# Patient Record
Sex: Female | Born: 1935
Health system: Southern US, Community
[De-identification: ages and names within clinical notes are randomized; demographics above are authoritative.]

## PROBLEM LIST (undated history)

## (undated) DIAGNOSIS — R112 Nausea with vomiting, unspecified: Secondary | ICD-10-CM

## (undated) DIAGNOSIS — F32A Depression, unspecified: Secondary | ICD-10-CM

## (undated) DIAGNOSIS — Z9889 Other specified postprocedural states: Secondary | ICD-10-CM

## (undated) DIAGNOSIS — IMO0001 Reserved for inherently not codable concepts without codable children: Secondary | ICD-10-CM

## (undated) DIAGNOSIS — F329 Major depressive disorder, single episode, unspecified: Secondary | ICD-10-CM

## (undated) DIAGNOSIS — M1990 Unspecified osteoarthritis, unspecified site: Secondary | ICD-10-CM

## (undated) DIAGNOSIS — I639 Cerebral infarction, unspecified: Secondary | ICD-10-CM

## (undated) DIAGNOSIS — E039 Hypothyroidism, unspecified: Secondary | ICD-10-CM

## (undated) DIAGNOSIS — N390 Urinary tract infection, site not specified: Secondary | ICD-10-CM

## (undated) DIAGNOSIS — I739 Peripheral vascular disease, unspecified: Secondary | ICD-10-CM

## (undated) DIAGNOSIS — Z7901 Long term (current) use of anticoagulants: Secondary | ICD-10-CM

## (undated) DIAGNOSIS — J309 Allergic rhinitis, unspecified: Secondary | ICD-10-CM

## (undated) DIAGNOSIS — Z9289 Personal history of other medical treatment: Secondary | ICD-10-CM

## (undated) DIAGNOSIS — E785 Hyperlipidemia, unspecified: Secondary | ICD-10-CM

## (undated) DIAGNOSIS — K219 Gastro-esophageal reflux disease without esophagitis: Secondary | ICD-10-CM

## (undated) DIAGNOSIS — I10 Essential (primary) hypertension: Secondary | ICD-10-CM

## (undated) DIAGNOSIS — Z8673 Personal history of transient ischemic attack (TIA), and cerebral infarction without residual deficits: Secondary | ICD-10-CM

## (undated) DIAGNOSIS — I499 Cardiac arrhythmia, unspecified: Secondary | ICD-10-CM

## (undated) DIAGNOSIS — J189 Pneumonia, unspecified organism: Secondary | ICD-10-CM

## (undated) DIAGNOSIS — F419 Anxiety disorder, unspecified: Secondary | ICD-10-CM

## (undated) DIAGNOSIS — E669 Obesity, unspecified: Secondary | ICD-10-CM

## (undated) DIAGNOSIS — I480 Paroxysmal atrial fibrillation: Secondary | ICD-10-CM

## (undated) DIAGNOSIS — J330 Polyp of nasal cavity: Secondary | ICD-10-CM

## (undated) HISTORY — DX: Personal history of transient ischemic attack (TIA), and cerebral infarction without residual deficits: Z86.73

## (undated) HISTORY — PX: TOTAL ABDOMINAL HYSTERECTOMY W/ BILATERAL SALPINGOOPHORECTOMY: SHX83

## (undated) HISTORY — PX: OTHER SURGICAL HISTORY: SHX169

## (undated) HISTORY — DX: Hyperlipidemia, unspecified: E78.5

## (undated) HISTORY — DX: Reserved for inherently not codable concepts without codable children: IMO0001

## (undated) HISTORY — DX: Personal history of other medical treatment: Z92.89

## (undated) HISTORY — DX: Paroxysmal atrial fibrillation: I48.0

## (undated) HISTORY — DX: Allergic rhinitis, unspecified: J30.9

## (undated) HISTORY — PX: BLADDER SURGERY: SHX569

## (undated) HISTORY — DX: Hypothyroidism, unspecified: E03.9

## (undated) HISTORY — DX: Gastro-esophageal reflux disease without esophagitis: K21.9

## (undated) HISTORY — PX: NASAL SINUS SURGERY: SHX719

## (undated) HISTORY — DX: Obesity, unspecified: E66.9

## (undated) HISTORY — DX: Polyp of nasal cavity: J33.0

## (undated) HISTORY — PX: CARPAL TUNNEL RELEASE: SHX101

## (undated) HISTORY — DX: Long term (current) use of anticoagulants: Z79.01

---

## 1998-01-31 ENCOUNTER — Encounter: Admission: RE | Admit: 1998-01-31 | Discharge: 1998-05-01 | Payer: Self-pay | Admitting: Endocrinology

## 1998-05-14 ENCOUNTER — Ambulatory Visit: Admission: RE | Admit: 1998-05-14 | Discharge: 1998-05-14 | Payer: Self-pay | Admitting: Cardiology

## 1998-06-06 ENCOUNTER — Other Ambulatory Visit: Admission: RE | Admit: 1998-06-06 | Discharge: 1998-06-06 | Payer: Self-pay | Admitting: *Deleted

## 1998-11-06 ENCOUNTER — Inpatient Hospital Stay (HOSPITAL_COMMUNITY): Admission: RE | Admit: 1998-11-06 | Discharge: 1998-11-08 | Payer: Self-pay | Admitting: *Deleted

## 1999-08-07 ENCOUNTER — Other Ambulatory Visit: Admission: RE | Admit: 1999-08-07 | Discharge: 1999-08-07 | Payer: Self-pay | Admitting: *Deleted

## 1999-08-19 ENCOUNTER — Encounter: Payer: Self-pay | Admitting: Cardiology

## 1999-08-19 ENCOUNTER — Ambulatory Visit (HOSPITAL_COMMUNITY): Admission: RE | Admit: 1999-08-19 | Discharge: 1999-08-19 | Payer: Self-pay | Admitting: Cardiology

## 2000-03-12 ENCOUNTER — Encounter: Payer: Self-pay | Admitting: Neurosurgery

## 2000-03-16 ENCOUNTER — Ambulatory Visit (HOSPITAL_COMMUNITY): Admission: RE | Admit: 2000-03-16 | Discharge: 2000-03-16 | Payer: Self-pay | Admitting: Neurosurgery

## 2000-10-14 ENCOUNTER — Other Ambulatory Visit: Admission: RE | Admit: 2000-10-14 | Discharge: 2000-10-14 | Payer: Self-pay | Admitting: *Deleted

## 2001-11-14 ENCOUNTER — Other Ambulatory Visit: Admission: RE | Admit: 2001-11-14 | Discharge: 2001-11-14 | Payer: Self-pay | Admitting: *Deleted

## 2003-02-08 ENCOUNTER — Other Ambulatory Visit: Admission: RE | Admit: 2003-02-08 | Discharge: 2003-02-08 | Payer: Self-pay | Admitting: *Deleted

## 2004-03-31 ENCOUNTER — Other Ambulatory Visit: Admission: RE | Admit: 2004-03-31 | Discharge: 2004-03-31 | Payer: Self-pay | Admitting: *Deleted

## 2004-09-02 ENCOUNTER — Ambulatory Visit (HOSPITAL_COMMUNITY): Admission: RE | Admit: 2004-09-02 | Discharge: 2004-09-02 | Payer: Self-pay | Admitting: Internal Medicine

## 2004-10-10 ENCOUNTER — Ambulatory Visit: Payer: Self-pay | Admitting: Internal Medicine

## 2004-10-13 ENCOUNTER — Ambulatory Visit: Payer: Self-pay | Admitting: Internal Medicine

## 2004-10-17 ENCOUNTER — Ambulatory Visit: Payer: Self-pay | Admitting: Internal Medicine

## 2004-10-24 ENCOUNTER — Ambulatory Visit: Payer: Self-pay | Admitting: Internal Medicine

## 2004-10-31 ENCOUNTER — Ambulatory Visit: Payer: Self-pay | Admitting: Internal Medicine

## 2004-11-03 ENCOUNTER — Ambulatory Visit: Payer: Self-pay | Admitting: Internal Medicine

## 2004-11-07 ENCOUNTER — Ambulatory Visit: Payer: Self-pay | Admitting: Internal Medicine

## 2004-11-14 ENCOUNTER — Ambulatory Visit: Payer: Self-pay | Admitting: Internal Medicine

## 2004-11-21 ENCOUNTER — Ambulatory Visit: Payer: Self-pay | Admitting: Internal Medicine

## 2004-11-27 ENCOUNTER — Ambulatory Visit: Payer: Self-pay | Admitting: Internal Medicine

## 2004-12-12 ENCOUNTER — Ambulatory Visit: Payer: Self-pay | Admitting: Internal Medicine

## 2004-12-19 ENCOUNTER — Ambulatory Visit: Payer: Self-pay | Admitting: Internal Medicine

## 2004-12-23 ENCOUNTER — Ambulatory Visit: Payer: Self-pay | Admitting: Internal Medicine

## 2004-12-26 ENCOUNTER — Ambulatory Visit: Payer: Self-pay | Admitting: Internal Medicine

## 2005-01-02 ENCOUNTER — Ambulatory Visit: Payer: Self-pay | Admitting: Internal Medicine

## 2005-01-09 ENCOUNTER — Ambulatory Visit: Payer: Self-pay | Admitting: Internal Medicine

## 2005-01-15 ENCOUNTER — Ambulatory Visit: Payer: Self-pay | Admitting: Internal Medicine

## 2005-01-30 ENCOUNTER — Ambulatory Visit: Payer: Self-pay | Admitting: Internal Medicine

## 2005-02-05 ENCOUNTER — Ambulatory Visit: Payer: Self-pay | Admitting: Internal Medicine

## 2005-02-13 ENCOUNTER — Ambulatory Visit: Payer: Self-pay | Admitting: Internal Medicine

## 2005-02-20 ENCOUNTER — Ambulatory Visit: Payer: Self-pay | Admitting: Internal Medicine

## 2005-02-27 ENCOUNTER — Ambulatory Visit: Payer: Self-pay | Admitting: Internal Medicine

## 2005-03-06 ENCOUNTER — Ambulatory Visit: Payer: Self-pay | Admitting: Internal Medicine

## 2005-03-09 ENCOUNTER — Ambulatory Visit: Payer: Self-pay | Admitting: Internal Medicine

## 2005-03-12 ENCOUNTER — Ambulatory Visit: Payer: Self-pay | Admitting: Internal Medicine

## 2005-03-27 ENCOUNTER — Ambulatory Visit: Payer: Self-pay | Admitting: Internal Medicine

## 2005-04-03 ENCOUNTER — Ambulatory Visit: Payer: Self-pay | Admitting: Internal Medicine

## 2005-04-10 ENCOUNTER — Ambulatory Visit: Payer: Self-pay | Admitting: Internal Medicine

## 2005-04-17 ENCOUNTER — Ambulatory Visit: Payer: Self-pay | Admitting: Internal Medicine

## 2005-04-24 ENCOUNTER — Ambulatory Visit: Payer: Self-pay | Admitting: Internal Medicine

## 2005-05-08 ENCOUNTER — Ambulatory Visit: Payer: Self-pay | Admitting: Internal Medicine

## 2005-05-11 ENCOUNTER — Emergency Department (HOSPITAL_COMMUNITY): Admission: EM | Admit: 2005-05-11 | Discharge: 2005-05-11 | Payer: Self-pay | Admitting: Emergency Medicine

## 2005-05-15 ENCOUNTER — Ambulatory Visit: Payer: Self-pay | Admitting: Internal Medicine

## 2005-05-22 ENCOUNTER — Ambulatory Visit: Payer: Self-pay | Admitting: Internal Medicine

## 2005-05-29 ENCOUNTER — Ambulatory Visit: Payer: Self-pay | Admitting: Internal Medicine

## 2005-06-05 ENCOUNTER — Ambulatory Visit: Payer: Self-pay | Admitting: Internal Medicine

## 2005-06-12 ENCOUNTER — Ambulatory Visit: Payer: Self-pay | Admitting: Internal Medicine

## 2005-06-19 ENCOUNTER — Ambulatory Visit: Payer: Self-pay | Admitting: Internal Medicine

## 2005-06-25 ENCOUNTER — Ambulatory Visit: Payer: Self-pay | Admitting: Internal Medicine

## 2005-07-02 ENCOUNTER — Ambulatory Visit: Payer: Self-pay | Admitting: Internal Medicine

## 2005-07-10 ENCOUNTER — Ambulatory Visit: Payer: Self-pay | Admitting: Internal Medicine

## 2005-07-13 ENCOUNTER — Ambulatory Visit: Payer: Self-pay | Admitting: Internal Medicine

## 2005-07-17 ENCOUNTER — Ambulatory Visit: Payer: Self-pay | Admitting: Internal Medicine

## 2005-07-24 ENCOUNTER — Ambulatory Visit: Payer: Self-pay | Admitting: Internal Medicine

## 2005-07-28 ENCOUNTER — Encounter (INDEPENDENT_AMBULATORY_CARE_PROVIDER_SITE_OTHER): Payer: Self-pay | Admitting: *Deleted

## 2005-07-28 ENCOUNTER — Ambulatory Visit (HOSPITAL_COMMUNITY): Admission: RE | Admit: 2005-07-28 | Discharge: 2005-07-28 | Payer: Self-pay | Admitting: *Deleted

## 2005-07-30 ENCOUNTER — Ambulatory Visit: Payer: Self-pay | Admitting: Internal Medicine

## 2005-08-06 ENCOUNTER — Ambulatory Visit: Payer: Self-pay | Admitting: Internal Medicine

## 2005-08-14 ENCOUNTER — Ambulatory Visit: Payer: Self-pay | Admitting: Internal Medicine

## 2005-08-21 ENCOUNTER — Ambulatory Visit: Payer: Self-pay | Admitting: Internal Medicine

## 2005-08-26 ENCOUNTER — Ambulatory Visit: Payer: Self-pay | Admitting: Internal Medicine

## 2005-09-04 ENCOUNTER — Ambulatory Visit: Payer: Self-pay | Admitting: Internal Medicine

## 2005-09-10 ENCOUNTER — Ambulatory Visit: Payer: Self-pay | Admitting: Internal Medicine

## 2005-09-17 ENCOUNTER — Ambulatory Visit: Payer: Self-pay | Admitting: Internal Medicine

## 2005-09-25 ENCOUNTER — Ambulatory Visit: Payer: Self-pay | Admitting: Internal Medicine

## 2005-10-02 ENCOUNTER — Ambulatory Visit: Payer: Self-pay | Admitting: Internal Medicine

## 2005-10-09 ENCOUNTER — Ambulatory Visit: Payer: Self-pay | Admitting: Internal Medicine

## 2005-10-16 ENCOUNTER — Ambulatory Visit: Payer: Self-pay | Admitting: Internal Medicine

## 2005-10-23 ENCOUNTER — Ambulatory Visit: Payer: Self-pay | Admitting: Internal Medicine

## 2005-10-30 ENCOUNTER — Ambulatory Visit: Payer: Self-pay | Admitting: Internal Medicine

## 2005-11-06 ENCOUNTER — Ambulatory Visit: Payer: Self-pay | Admitting: Internal Medicine

## 2005-11-13 ENCOUNTER — Ambulatory Visit: Payer: Self-pay | Admitting: Internal Medicine

## 2005-11-20 ENCOUNTER — Ambulatory Visit: Payer: Self-pay | Admitting: Internal Medicine

## 2005-12-04 ENCOUNTER — Ambulatory Visit: Payer: Self-pay | Admitting: Internal Medicine

## 2005-12-11 ENCOUNTER — Ambulatory Visit: Payer: Self-pay | Admitting: Internal Medicine

## 2005-12-14 ENCOUNTER — Ambulatory Visit: Payer: Self-pay | Admitting: Internal Medicine

## 2005-12-18 ENCOUNTER — Ambulatory Visit: Payer: Self-pay | Admitting: Internal Medicine

## 2005-12-25 ENCOUNTER — Ambulatory Visit: Payer: Self-pay | Admitting: Internal Medicine

## 2006-01-01 ENCOUNTER — Ambulatory Visit: Payer: Self-pay | Admitting: Internal Medicine

## 2006-01-07 ENCOUNTER — Ambulatory Visit: Payer: Self-pay | Admitting: Internal Medicine

## 2006-01-15 ENCOUNTER — Ambulatory Visit: Payer: Self-pay | Admitting: Internal Medicine

## 2006-01-22 ENCOUNTER — Ambulatory Visit: Payer: Self-pay | Admitting: Internal Medicine

## 2006-01-23 ENCOUNTER — Emergency Department (HOSPITAL_COMMUNITY): Admission: EM | Admit: 2006-01-23 | Discharge: 2006-01-23 | Payer: Self-pay | Admitting: Emergency Medicine

## 2006-01-29 ENCOUNTER — Ambulatory Visit: Payer: Self-pay | Admitting: Internal Medicine

## 2006-02-04 ENCOUNTER — Ambulatory Visit: Payer: Self-pay | Admitting: Internal Medicine

## 2006-02-19 ENCOUNTER — Ambulatory Visit: Payer: Self-pay | Admitting: Internal Medicine

## 2006-02-24 ENCOUNTER — Ambulatory Visit: Payer: Self-pay | Admitting: Internal Medicine

## 2006-02-26 ENCOUNTER — Ambulatory Visit: Payer: Self-pay | Admitting: Internal Medicine

## 2006-03-04 ENCOUNTER — Ambulatory Visit: Payer: Self-pay | Admitting: Internal Medicine

## 2006-03-11 ENCOUNTER — Ambulatory Visit: Payer: Self-pay | Admitting: Internal Medicine

## 2006-03-18 ENCOUNTER — Ambulatory Visit: Payer: Self-pay | Admitting: Internal Medicine

## 2006-03-25 ENCOUNTER — Ambulatory Visit: Payer: Self-pay | Admitting: Internal Medicine

## 2006-04-02 ENCOUNTER — Ambulatory Visit: Payer: Self-pay | Admitting: Internal Medicine

## 2006-04-09 ENCOUNTER — Ambulatory Visit: Payer: Self-pay | Admitting: Internal Medicine

## 2006-04-15 ENCOUNTER — Ambulatory Visit: Payer: Self-pay | Admitting: Internal Medicine

## 2006-04-21 ENCOUNTER — Ambulatory Visit: Payer: Self-pay | Admitting: Internal Medicine

## 2006-04-23 ENCOUNTER — Ambulatory Visit: Payer: Self-pay | Admitting: Internal Medicine

## 2006-04-30 ENCOUNTER — Ambulatory Visit: Payer: Self-pay | Admitting: Internal Medicine

## 2006-05-07 ENCOUNTER — Ambulatory Visit: Payer: Self-pay | Admitting: Internal Medicine

## 2006-05-13 ENCOUNTER — Ambulatory Visit: Payer: Self-pay | Admitting: Internal Medicine

## 2006-05-19 ENCOUNTER — Ambulatory Visit: Payer: Self-pay | Admitting: Internal Medicine

## 2006-05-27 ENCOUNTER — Ambulatory Visit: Payer: Self-pay | Admitting: Internal Medicine

## 2006-06-04 ENCOUNTER — Ambulatory Visit: Payer: Self-pay | Admitting: Internal Medicine

## 2006-06-11 ENCOUNTER — Ambulatory Visit: Payer: Self-pay | Admitting: Internal Medicine

## 2006-06-18 ENCOUNTER — Ambulatory Visit: Payer: Self-pay | Admitting: Internal Medicine

## 2006-06-25 ENCOUNTER — Ambulatory Visit: Payer: Self-pay | Admitting: Internal Medicine

## 2006-07-02 ENCOUNTER — Ambulatory Visit: Payer: Self-pay | Admitting: Internal Medicine

## 2006-07-08 ENCOUNTER — Ambulatory Visit: Payer: Self-pay | Admitting: Internal Medicine

## 2006-07-23 ENCOUNTER — Ambulatory Visit: Payer: Self-pay | Admitting: Internal Medicine

## 2006-07-30 ENCOUNTER — Ambulatory Visit: Payer: Self-pay | Admitting: Internal Medicine

## 2006-08-05 ENCOUNTER — Ambulatory Visit: Payer: Self-pay | Admitting: Internal Medicine

## 2006-08-13 ENCOUNTER — Ambulatory Visit: Payer: Self-pay | Admitting: Internal Medicine

## 2006-08-23 ENCOUNTER — Ambulatory Visit: Payer: Self-pay | Admitting: Internal Medicine

## 2006-09-02 ENCOUNTER — Ambulatory Visit: Payer: Self-pay | Admitting: Internal Medicine

## 2006-09-03 ENCOUNTER — Ambulatory Visit: Payer: Self-pay | Admitting: Internal Medicine

## 2006-09-10 ENCOUNTER — Ambulatory Visit: Payer: Self-pay | Admitting: Internal Medicine

## 2006-09-17 ENCOUNTER — Ambulatory Visit: Payer: Self-pay | Admitting: Internal Medicine

## 2006-09-24 ENCOUNTER — Ambulatory Visit: Payer: Self-pay | Admitting: Internal Medicine

## 2006-09-30 ENCOUNTER — Ambulatory Visit: Payer: Self-pay | Admitting: Internal Medicine

## 2006-10-07 ENCOUNTER — Ambulatory Visit: Payer: Self-pay | Admitting: Internal Medicine

## 2006-10-15 ENCOUNTER — Ambulatory Visit: Payer: Self-pay | Admitting: Internal Medicine

## 2006-10-20 ENCOUNTER — Other Ambulatory Visit: Admission: RE | Admit: 2006-10-20 | Discharge: 2006-10-20 | Payer: Self-pay | Admitting: *Deleted

## 2006-10-21 ENCOUNTER — Ambulatory Visit: Payer: Self-pay | Admitting: Internal Medicine

## 2006-10-29 ENCOUNTER — Ambulatory Visit: Payer: Self-pay | Admitting: Internal Medicine

## 2006-11-04 ENCOUNTER — Ambulatory Visit: Payer: Self-pay | Admitting: Internal Medicine

## 2006-11-19 ENCOUNTER — Ambulatory Visit: Payer: Self-pay | Admitting: Internal Medicine

## 2006-11-26 ENCOUNTER — Ambulatory Visit: Payer: Self-pay | Admitting: Internal Medicine

## 2006-12-03 ENCOUNTER — Ambulatory Visit: Payer: Self-pay | Admitting: Internal Medicine

## 2006-12-10 ENCOUNTER — Ambulatory Visit: Payer: Self-pay | Admitting: Internal Medicine

## 2006-12-23 ENCOUNTER — Ambulatory Visit: Payer: Self-pay | Admitting: Internal Medicine

## 2006-12-31 ENCOUNTER — Ambulatory Visit: Payer: Self-pay | Admitting: Internal Medicine

## 2007-01-06 ENCOUNTER — Ambulatory Visit: Payer: Self-pay | Admitting: Internal Medicine

## 2007-01-14 ENCOUNTER — Ambulatory Visit: Payer: Self-pay | Admitting: Internal Medicine

## 2007-01-21 ENCOUNTER — Ambulatory Visit: Payer: Self-pay | Admitting: Internal Medicine

## 2007-01-28 ENCOUNTER — Ambulatory Visit: Payer: Self-pay | Admitting: Internal Medicine

## 2007-02-01 ENCOUNTER — Ambulatory Visit: Payer: Self-pay | Admitting: Internal Medicine

## 2007-02-04 ENCOUNTER — Ambulatory Visit: Payer: Self-pay | Admitting: Internal Medicine

## 2007-02-10 ENCOUNTER — Ambulatory Visit: Payer: Self-pay | Admitting: Internal Medicine

## 2007-02-18 ENCOUNTER — Ambulatory Visit: Payer: Self-pay | Admitting: Internal Medicine

## 2007-02-21 ENCOUNTER — Ambulatory Visit: Payer: Self-pay | Admitting: Internal Medicine

## 2007-02-25 ENCOUNTER — Ambulatory Visit: Payer: Self-pay | Admitting: Internal Medicine

## 2007-03-04 ENCOUNTER — Ambulatory Visit: Payer: Self-pay | Admitting: Internal Medicine

## 2007-03-18 ENCOUNTER — Ambulatory Visit: Payer: Self-pay | Admitting: Internal Medicine

## 2007-03-25 ENCOUNTER — Ambulatory Visit: Payer: Self-pay | Admitting: Internal Medicine

## 2007-04-01 ENCOUNTER — Ambulatory Visit: Payer: Self-pay | Admitting: Internal Medicine

## 2007-04-07 ENCOUNTER — Ambulatory Visit: Payer: Self-pay | Admitting: Internal Medicine

## 2007-04-14 ENCOUNTER — Ambulatory Visit: Payer: Self-pay | Admitting: Internal Medicine

## 2007-04-21 ENCOUNTER — Ambulatory Visit: Payer: Self-pay | Admitting: Internal Medicine

## 2007-04-29 ENCOUNTER — Ambulatory Visit: Payer: Self-pay | Admitting: Internal Medicine

## 2007-05-06 ENCOUNTER — Ambulatory Visit: Payer: Self-pay | Admitting: Internal Medicine

## 2007-05-13 ENCOUNTER — Ambulatory Visit: Payer: Self-pay | Admitting: Internal Medicine

## 2007-05-20 ENCOUNTER — Ambulatory Visit: Payer: Self-pay | Admitting: Internal Medicine

## 2007-05-27 ENCOUNTER — Ambulatory Visit: Payer: Self-pay | Admitting: Internal Medicine

## 2007-06-02 ENCOUNTER — Ambulatory Visit: Payer: Self-pay | Admitting: Internal Medicine

## 2007-06-13 ENCOUNTER — Ambulatory Visit: Payer: Self-pay | Admitting: Internal Medicine

## 2007-06-17 ENCOUNTER — Ambulatory Visit: Payer: Self-pay | Admitting: Internal Medicine

## 2007-06-23 ENCOUNTER — Ambulatory Visit: Payer: Self-pay | Admitting: Internal Medicine

## 2007-07-01 ENCOUNTER — Ambulatory Visit: Payer: Self-pay | Admitting: Internal Medicine

## 2007-07-15 ENCOUNTER — Ambulatory Visit: Payer: Self-pay | Admitting: Internal Medicine

## 2007-07-22 ENCOUNTER — Ambulatory Visit: Payer: Self-pay | Admitting: Internal Medicine

## 2007-07-27 DIAGNOSIS — I4891 Unspecified atrial fibrillation: Secondary | ICD-10-CM | POA: Insufficient documentation

## 2007-07-27 DIAGNOSIS — J3089 Other allergic rhinitis: Secondary | ICD-10-CM

## 2007-07-27 DIAGNOSIS — J330 Polyp of nasal cavity: Secondary | ICD-10-CM | POA: Insufficient documentation

## 2007-07-27 DIAGNOSIS — J302 Other seasonal allergic rhinitis: Secondary | ICD-10-CM | POA: Insufficient documentation

## 2007-07-29 ENCOUNTER — Ambulatory Visit: Payer: Self-pay | Admitting: Internal Medicine

## 2007-08-05 ENCOUNTER — Ambulatory Visit: Payer: Self-pay | Admitting: Internal Medicine

## 2007-08-12 ENCOUNTER — Ambulatory Visit: Payer: Self-pay | Admitting: Internal Medicine

## 2007-08-19 ENCOUNTER — Ambulatory Visit: Payer: Self-pay | Admitting: Internal Medicine

## 2007-08-22 ENCOUNTER — Ambulatory Visit: Payer: Self-pay | Admitting: Internal Medicine

## 2007-08-26 ENCOUNTER — Ambulatory Visit: Payer: Self-pay | Admitting: Internal Medicine

## 2007-09-02 ENCOUNTER — Ambulatory Visit: Payer: Self-pay | Admitting: Internal Medicine

## 2007-09-09 ENCOUNTER — Ambulatory Visit: Payer: Self-pay | Admitting: Internal Medicine

## 2007-09-16 ENCOUNTER — Ambulatory Visit: Payer: Self-pay | Admitting: Internal Medicine

## 2007-09-23 ENCOUNTER — Ambulatory Visit: Payer: Self-pay | Admitting: Internal Medicine

## 2007-09-30 ENCOUNTER — Ambulatory Visit: Payer: Self-pay | Admitting: Internal Medicine

## 2007-10-07 ENCOUNTER — Ambulatory Visit: Payer: Self-pay | Admitting: Internal Medicine

## 2007-10-14 ENCOUNTER — Ambulatory Visit: Payer: Self-pay | Admitting: Internal Medicine

## 2007-10-17 ENCOUNTER — Ambulatory Visit: Payer: Self-pay | Admitting: Internal Medicine

## 2007-10-20 ENCOUNTER — Ambulatory Visit: Payer: Self-pay | Admitting: Internal Medicine

## 2007-10-28 ENCOUNTER — Ambulatory Visit: Payer: Self-pay | Admitting: Internal Medicine

## 2007-11-11 ENCOUNTER — Ambulatory Visit: Payer: Self-pay | Admitting: Internal Medicine

## 2007-12-02 ENCOUNTER — Ambulatory Visit: Payer: Self-pay | Admitting: Internal Medicine

## 2007-12-09 ENCOUNTER — Ambulatory Visit: Payer: Self-pay | Admitting: Internal Medicine

## 2007-12-16 ENCOUNTER — Ambulatory Visit: Payer: Self-pay | Admitting: Internal Medicine

## 2007-12-21 ENCOUNTER — Ambulatory Visit: Payer: Self-pay | Admitting: Internal Medicine

## 2007-12-30 ENCOUNTER — Ambulatory Visit: Payer: Self-pay | Admitting: Internal Medicine

## 2008-01-06 ENCOUNTER — Ambulatory Visit: Payer: Self-pay | Admitting: Internal Medicine

## 2008-01-11 ENCOUNTER — Ambulatory Visit: Payer: Self-pay | Admitting: Internal Medicine

## 2008-01-20 ENCOUNTER — Ambulatory Visit: Payer: Self-pay | Admitting: Internal Medicine

## 2008-01-27 ENCOUNTER — Ambulatory Visit: Payer: Self-pay | Admitting: Internal Medicine

## 2008-02-09 ENCOUNTER — Ambulatory Visit: Payer: Self-pay | Admitting: Internal Medicine

## 2008-02-23 ENCOUNTER — Ambulatory Visit: Payer: Self-pay | Admitting: Internal Medicine

## 2008-03-02 ENCOUNTER — Ambulatory Visit: Payer: Self-pay | Admitting: Internal Medicine

## 2008-03-07 ENCOUNTER — Encounter: Payer: Self-pay | Admitting: Internal Medicine

## 2008-03-09 ENCOUNTER — Ambulatory Visit: Payer: Self-pay | Admitting: Internal Medicine

## 2008-03-15 ENCOUNTER — Ambulatory Visit: Payer: Self-pay | Admitting: Internal Medicine

## 2008-03-16 ENCOUNTER — Ambulatory Visit: Payer: Self-pay | Admitting: Internal Medicine

## 2008-03-22 ENCOUNTER — Ambulatory Visit: Payer: Self-pay | Admitting: Internal Medicine

## 2008-03-29 ENCOUNTER — Ambulatory Visit: Payer: Self-pay | Admitting: Internal Medicine

## 2008-04-04 ENCOUNTER — Ambulatory Visit: Payer: Self-pay | Admitting: Internal Medicine

## 2008-04-12 ENCOUNTER — Ambulatory Visit: Payer: Self-pay | Admitting: Internal Medicine

## 2008-04-19 ENCOUNTER — Ambulatory Visit: Payer: Self-pay | Admitting: Internal Medicine

## 2008-04-25 ENCOUNTER — Ambulatory Visit: Payer: Self-pay | Admitting: Internal Medicine

## 2008-05-03 ENCOUNTER — Ambulatory Visit: Payer: Self-pay | Admitting: Internal Medicine

## 2008-05-09 ENCOUNTER — Ambulatory Visit: Payer: Self-pay | Admitting: Internal Medicine

## 2008-05-17 ENCOUNTER — Ambulatory Visit: Payer: Self-pay | Admitting: Internal Medicine

## 2008-05-24 ENCOUNTER — Ambulatory Visit: Payer: Self-pay | Admitting: Internal Medicine

## 2008-06-01 ENCOUNTER — Ambulatory Visit: Payer: Self-pay | Admitting: Internal Medicine

## 2008-06-07 ENCOUNTER — Ambulatory Visit: Payer: Self-pay | Admitting: Internal Medicine

## 2008-06-15 ENCOUNTER — Ambulatory Visit: Payer: Self-pay | Admitting: Internal Medicine

## 2008-06-22 ENCOUNTER — Ambulatory Visit: Payer: Self-pay | Admitting: Internal Medicine

## 2008-06-29 ENCOUNTER — Ambulatory Visit: Payer: Self-pay | Admitting: Internal Medicine

## 2008-07-06 ENCOUNTER — Ambulatory Visit: Payer: Self-pay | Admitting: Internal Medicine

## 2008-07-13 ENCOUNTER — Ambulatory Visit: Payer: Self-pay | Admitting: Internal Medicine

## 2008-07-16 ENCOUNTER — Other Ambulatory Visit: Admission: RE | Admit: 2008-07-16 | Discharge: 2008-07-16 | Payer: Self-pay | Admitting: Gynecology

## 2008-07-24 ENCOUNTER — Ambulatory Visit: Payer: Self-pay | Admitting: Internal Medicine

## 2008-07-27 ENCOUNTER — Ambulatory Visit: Payer: Self-pay | Admitting: Internal Medicine

## 2008-07-30 ENCOUNTER — Ambulatory Visit: Payer: Self-pay | Admitting: Internal Medicine

## 2008-08-03 ENCOUNTER — Ambulatory Visit: Payer: Self-pay | Admitting: Internal Medicine

## 2008-08-06 ENCOUNTER — Ambulatory Visit: Payer: Self-pay | Admitting: Internal Medicine

## 2008-08-10 ENCOUNTER — Ambulatory Visit: Payer: Self-pay | Admitting: Internal Medicine

## 2008-08-17 ENCOUNTER — Ambulatory Visit: Payer: Self-pay | Admitting: Internal Medicine

## 2008-08-24 ENCOUNTER — Ambulatory Visit: Payer: Self-pay | Admitting: Internal Medicine

## 2008-09-07 ENCOUNTER — Ambulatory Visit: Payer: Self-pay | Admitting: Internal Medicine

## 2008-09-14 ENCOUNTER — Ambulatory Visit: Payer: Self-pay | Admitting: Internal Medicine

## 2008-09-20 ENCOUNTER — Ambulatory Visit: Payer: Self-pay | Admitting: Internal Medicine

## 2008-10-03 ENCOUNTER — Ambulatory Visit: Payer: Self-pay | Admitting: Internal Medicine

## 2008-10-12 ENCOUNTER — Ambulatory Visit: Payer: Self-pay | Admitting: Internal Medicine

## 2008-10-19 ENCOUNTER — Ambulatory Visit: Payer: Self-pay | Admitting: Internal Medicine

## 2008-10-26 ENCOUNTER — Ambulatory Visit: Payer: Self-pay | Admitting: Internal Medicine

## 2008-11-02 ENCOUNTER — Ambulatory Visit: Payer: Self-pay | Admitting: Internal Medicine

## 2008-11-16 ENCOUNTER — Telehealth: Payer: Self-pay | Admitting: Internal Medicine

## 2008-11-16 ENCOUNTER — Ambulatory Visit: Payer: Self-pay | Admitting: Internal Medicine

## 2008-11-23 ENCOUNTER — Ambulatory Visit: Payer: Self-pay | Admitting: Internal Medicine

## 2008-11-30 ENCOUNTER — Ambulatory Visit: Payer: Self-pay | Admitting: Internal Medicine

## 2008-12-06 ENCOUNTER — Ambulatory Visit: Payer: Self-pay | Admitting: Internal Medicine

## 2008-12-21 ENCOUNTER — Ambulatory Visit: Payer: Self-pay | Admitting: Internal Medicine

## 2008-12-27 ENCOUNTER — Ambulatory Visit: Payer: Self-pay | Admitting: Internal Medicine

## 2009-01-03 ENCOUNTER — Ambulatory Visit: Payer: Self-pay | Admitting: Internal Medicine

## 2009-01-10 ENCOUNTER — Ambulatory Visit: Payer: Self-pay | Admitting: Internal Medicine

## 2009-01-18 ENCOUNTER — Ambulatory Visit: Payer: Self-pay | Admitting: Internal Medicine

## 2009-02-01 ENCOUNTER — Ambulatory Visit: Payer: Self-pay | Admitting: Internal Medicine

## 2009-02-07 ENCOUNTER — Ambulatory Visit: Payer: Self-pay | Admitting: Internal Medicine

## 2009-02-07 DIAGNOSIS — R059 Cough, unspecified: Secondary | ICD-10-CM | POA: Insufficient documentation

## 2009-02-07 DIAGNOSIS — E039 Hypothyroidism, unspecified: Secondary | ICD-10-CM | POA: Insufficient documentation

## 2009-02-07 DIAGNOSIS — R05 Cough: Secondary | ICD-10-CM

## 2009-02-08 ENCOUNTER — Ambulatory Visit: Payer: Self-pay | Admitting: Internal Medicine

## 2009-02-15 ENCOUNTER — Ambulatory Visit: Payer: Self-pay | Admitting: Internal Medicine

## 2009-02-21 ENCOUNTER — Ambulatory Visit: Payer: Self-pay | Admitting: Internal Medicine

## 2009-02-21 ENCOUNTER — Telehealth: Payer: Self-pay | Admitting: Internal Medicine

## 2009-02-28 ENCOUNTER — Ambulatory Visit: Payer: Self-pay | Admitting: Internal Medicine

## 2009-03-07 ENCOUNTER — Ambulatory Visit: Payer: Self-pay | Admitting: Internal Medicine

## 2009-03-14 ENCOUNTER — Ambulatory Visit: Payer: Self-pay | Admitting: Internal Medicine

## 2009-03-20 ENCOUNTER — Ambulatory Visit: Payer: Self-pay | Admitting: Internal Medicine

## 2009-03-21 ENCOUNTER — Ambulatory Visit: Payer: Self-pay | Admitting: Internal Medicine

## 2009-03-29 ENCOUNTER — Ambulatory Visit: Payer: Self-pay | Admitting: Internal Medicine

## 2009-04-04 ENCOUNTER — Ambulatory Visit: Payer: Self-pay | Admitting: Internal Medicine

## 2009-04-11 ENCOUNTER — Ambulatory Visit: Payer: Self-pay | Admitting: Internal Medicine

## 2009-04-25 ENCOUNTER — Ambulatory Visit: Payer: Self-pay | Admitting: Internal Medicine

## 2009-04-27 ENCOUNTER — Emergency Department (HOSPITAL_COMMUNITY): Admission: EM | Admit: 2009-04-27 | Discharge: 2009-04-27 | Payer: Self-pay | Admitting: Emergency Medicine

## 2009-05-09 ENCOUNTER — Ambulatory Visit: Payer: Self-pay | Admitting: Internal Medicine

## 2009-05-16 ENCOUNTER — Ambulatory Visit: Payer: Self-pay | Admitting: Internal Medicine

## 2009-05-24 ENCOUNTER — Ambulatory Visit: Payer: Self-pay | Admitting: Internal Medicine

## 2009-06-06 ENCOUNTER — Ambulatory Visit: Payer: Self-pay | Admitting: Internal Medicine

## 2009-06-14 ENCOUNTER — Ambulatory Visit: Payer: Self-pay | Admitting: Internal Medicine

## 2009-06-28 ENCOUNTER — Ambulatory Visit: Payer: Self-pay | Admitting: Internal Medicine

## 2009-07-01 ENCOUNTER — Ambulatory Visit: Payer: Self-pay | Admitting: Internal Medicine

## 2009-07-05 ENCOUNTER — Ambulatory Visit: Payer: Self-pay | Admitting: Internal Medicine

## 2009-07-12 ENCOUNTER — Ambulatory Visit: Payer: Self-pay | Admitting: Internal Medicine

## 2009-07-19 ENCOUNTER — Ambulatory Visit: Payer: Self-pay | Admitting: Internal Medicine

## 2009-07-26 ENCOUNTER — Ambulatory Visit: Payer: Self-pay | Admitting: Internal Medicine

## 2009-08-09 ENCOUNTER — Ambulatory Visit: Payer: Self-pay | Admitting: Internal Medicine

## 2009-08-16 ENCOUNTER — Ambulatory Visit: Payer: Self-pay | Admitting: Internal Medicine

## 2009-08-23 ENCOUNTER — Ambulatory Visit: Payer: Self-pay | Admitting: Internal Medicine

## 2009-08-30 ENCOUNTER — Ambulatory Visit: Payer: Self-pay | Admitting: Internal Medicine

## 2009-09-06 ENCOUNTER — Ambulatory Visit: Payer: Self-pay | Admitting: Internal Medicine

## 2009-09-13 ENCOUNTER — Ambulatory Visit: Payer: Self-pay | Admitting: Internal Medicine

## 2009-09-20 ENCOUNTER — Ambulatory Visit: Payer: Self-pay | Admitting: Internal Medicine

## 2009-10-03 ENCOUNTER — Ambulatory Visit: Payer: Self-pay | Admitting: Internal Medicine

## 2009-10-11 ENCOUNTER — Ambulatory Visit: Payer: Self-pay | Admitting: Internal Medicine

## 2009-10-18 ENCOUNTER — Ambulatory Visit: Payer: Self-pay | Admitting: Internal Medicine

## 2009-10-23 ENCOUNTER — Ambulatory Visit: Payer: Self-pay | Admitting: Internal Medicine

## 2009-11-01 ENCOUNTER — Ambulatory Visit: Payer: Self-pay | Admitting: Internal Medicine

## 2009-11-08 ENCOUNTER — Ambulatory Visit: Payer: Self-pay | Admitting: Internal Medicine

## 2009-11-15 ENCOUNTER — Ambulatory Visit: Payer: Self-pay | Admitting: Internal Medicine

## 2009-11-22 ENCOUNTER — Ambulatory Visit: Payer: Self-pay | Admitting: Internal Medicine

## 2009-11-29 ENCOUNTER — Ambulatory Visit: Payer: Self-pay | Admitting: Internal Medicine

## 2009-12-06 ENCOUNTER — Ambulatory Visit: Payer: Self-pay | Admitting: Internal Medicine

## 2009-12-13 ENCOUNTER — Ambulatory Visit: Payer: Self-pay | Admitting: Internal Medicine

## 2009-12-20 ENCOUNTER — Ambulatory Visit: Payer: Self-pay | Admitting: Internal Medicine

## 2009-12-27 ENCOUNTER — Ambulatory Visit: Payer: Self-pay | Admitting: Internal Medicine

## 2010-01-03 ENCOUNTER — Ambulatory Visit: Payer: Self-pay | Admitting: Internal Medicine

## 2010-01-10 ENCOUNTER — Ambulatory Visit: Payer: Self-pay | Admitting: Internal Medicine

## 2010-01-16 ENCOUNTER — Ambulatory Visit: Payer: Self-pay | Admitting: Internal Medicine

## 2010-01-23 ENCOUNTER — Ambulatory Visit: Payer: Self-pay | Admitting: Internal Medicine

## 2010-01-23 HISTORY — PX: US ECHOCARDIOGRAPHY: HXRAD669

## 2010-01-30 ENCOUNTER — Ambulatory Visit: Payer: Self-pay | Admitting: Internal Medicine

## 2010-02-07 ENCOUNTER — Ambulatory Visit: Payer: Self-pay | Admitting: Internal Medicine

## 2010-02-14 ENCOUNTER — Ambulatory Visit: Payer: Self-pay | Admitting: Internal Medicine

## 2010-02-21 ENCOUNTER — Ambulatory Visit: Payer: Self-pay | Admitting: Internal Medicine

## 2010-02-27 ENCOUNTER — Ambulatory Visit: Payer: Self-pay | Admitting: Internal Medicine

## 2010-03-06 ENCOUNTER — Ambulatory Visit: Payer: Self-pay | Admitting: Internal Medicine

## 2010-03-14 ENCOUNTER — Ambulatory Visit: Payer: Self-pay | Admitting: Internal Medicine

## 2010-03-20 ENCOUNTER — Ambulatory Visit: Payer: Self-pay | Admitting: Internal Medicine

## 2010-03-21 ENCOUNTER — Ambulatory Visit: Payer: Self-pay | Admitting: Internal Medicine

## 2010-03-24 ENCOUNTER — Ambulatory Visit: Payer: Self-pay | Admitting: Cardiology

## 2010-03-28 ENCOUNTER — Ambulatory Visit: Payer: Self-pay | Admitting: Internal Medicine

## 2010-03-31 LAB — CONVERTED CEMR LAB
BUN: 11 mg/dL (ref 6–23)
Chloride: 104 meq/L (ref 96–112)
Creatinine, Ser: 0.6 mg/dL (ref 0.4–1.2)
GFR calc non Af Amer: 113.12 mL/min (ref >60)
Potassium: 3.9 meq/L (ref 3.5–5.1)

## 2010-04-03 ENCOUNTER — Ambulatory Visit: Payer: Self-pay | Admitting: Internal Medicine

## 2010-04-17 ENCOUNTER — Ambulatory Visit: Payer: Self-pay | Admitting: Internal Medicine

## 2010-04-24 ENCOUNTER — Ambulatory Visit: Payer: Self-pay | Admitting: Internal Medicine

## 2010-04-28 ENCOUNTER — Ambulatory Visit: Payer: Self-pay | Admitting: Internal Medicine

## 2010-04-28 DIAGNOSIS — J328 Other chronic sinusitis: Secondary | ICD-10-CM | POA: Insufficient documentation

## 2010-05-01 ENCOUNTER — Ambulatory Visit: Payer: Self-pay | Admitting: Internal Medicine

## 2010-05-14 ENCOUNTER — Ambulatory Visit: Payer: Self-pay | Admitting: Internal Medicine

## 2010-05-15 ENCOUNTER — Encounter: Payer: Self-pay | Admitting: Internal Medicine

## 2010-05-23 ENCOUNTER — Ambulatory Visit: Payer: Self-pay | Admitting: Internal Medicine

## 2010-05-30 ENCOUNTER — Ambulatory Visit: Payer: Self-pay | Admitting: Internal Medicine

## 2010-06-06 ENCOUNTER — Ambulatory Visit: Payer: Self-pay | Admitting: Internal Medicine

## 2010-06-13 ENCOUNTER — Ambulatory Visit: Payer: Self-pay | Admitting: Internal Medicine

## 2010-06-19 ENCOUNTER — Ambulatory Visit: Payer: Self-pay | Admitting: Internal Medicine

## 2010-06-27 ENCOUNTER — Ambulatory Visit: Payer: Self-pay | Admitting: Internal Medicine

## 2010-07-03 ENCOUNTER — Ambulatory Visit: Payer: Self-pay | Admitting: Internal Medicine

## 2010-07-11 ENCOUNTER — Ambulatory Visit: Payer: Self-pay | Admitting: Internal Medicine

## 2010-07-18 ENCOUNTER — Ambulatory Visit: Payer: Self-pay | Admitting: Internal Medicine

## 2010-07-25 ENCOUNTER — Ambulatory Visit: Payer: Self-pay | Admitting: Internal Medicine

## 2010-07-31 ENCOUNTER — Ambulatory Visit: Payer: Self-pay | Admitting: Internal Medicine

## 2010-08-08 ENCOUNTER — Ambulatory Visit: Payer: Self-pay | Admitting: Internal Medicine

## 2010-08-15 ENCOUNTER — Ambulatory Visit: Payer: Self-pay | Admitting: Internal Medicine

## 2010-08-18 ENCOUNTER — Ambulatory Visit: Payer: Self-pay | Admitting: Internal Medicine

## 2010-08-22 ENCOUNTER — Ambulatory Visit: Payer: Self-pay | Admitting: Internal Medicine

## 2010-08-26 ENCOUNTER — Ambulatory Visit: Payer: Self-pay | Admitting: Cardiology

## 2010-09-04 ENCOUNTER — Ambulatory Visit: Payer: Self-pay | Admitting: Internal Medicine

## 2010-09-12 ENCOUNTER — Ambulatory Visit: Payer: Self-pay | Admitting: Internal Medicine

## 2010-09-19 ENCOUNTER — Ambulatory Visit: Payer: Self-pay | Admitting: Internal Medicine

## 2010-09-30 ENCOUNTER — Ambulatory Visit: Payer: Self-pay | Admitting: Internal Medicine

## 2010-10-07 ENCOUNTER — Ambulatory Visit: Payer: Self-pay | Admitting: Internal Medicine

## 2010-10-20 ENCOUNTER — Ambulatory Visit: Payer: Self-pay | Admitting: Internal Medicine

## 2010-10-24 ENCOUNTER — Ambulatory Visit: Payer: Self-pay | Admitting: Internal Medicine

## 2010-10-27 ENCOUNTER — Ambulatory Visit: Admit: 2010-10-27 | Payer: Self-pay | Admitting: Internal Medicine

## 2010-11-02 ENCOUNTER — Ambulatory Visit: Payer: Self-pay | Admitting: Internal Medicine

## 2010-11-04 NOTE — Miscellaneous (Signed)
Summary: Injection Record / [facility]    Injection Record /  Allergy    Imported By: Lennie Odor 04/08/2010 11:05:49  _____________________________________________________________________  External Attachment:    Type:   Image     Comment:   External Document

## 2010-11-04 NOTE — Miscellaneous (Signed)
Summary: Injection Financial risk analyst   Imported By: Sherian Rein 08/27/2010 14:10:09  _____________________________________________________________________  External Attachment:    Type:   Image     Comment:   External Document

## 2010-11-04 NOTE — Miscellaneous (Signed)
Summary: Injection Record/[facility]  Injection Record/[facility]   Imported By: Sherian Rein 02/25/2010 08:57:19  _____________________________________________________________________  External Attachment:    Type:   Image     Comment:   External Document

## 2010-11-04 NOTE — Assessment & Plan Note (Signed)
Summary: 12 months/apc   CC:  Yearly follow up visit-allergies; runny nose, sneezing, and congestion.Marland Kitchen  History of Present Illness: 08/22/07- 1. Allergic rhinitis. 2. Nasal polyposis. 3. Atrial fibrillation, cerebrovascular accident, Coumadin.   HISTORY:  Struggling to care for her husband who has medical problems, increased nasal congestion, had pneumonia vaccine, no recent chest complaints.  She continues allergy vaccine without problems and had flu vaccine earlier this year.   02/07/09 Allergic rhinitis, hx nasal polyps, hx AF/ coumadin Continues allergy vaccine here 1:10 GH. Main c/o is cough since January with some wheeze, but nonproductive. Went to Urgent Care at onset with fever treated then with augmentin. Cough wakes her and she gets up to drink. Denies postnasal drip. Takes Prilosec to prevent heartburn.  03/20/09- Allergic rhinitis, hx nasal polyps, hx AF/ coumadin Since here last month, cough almost gone and dry. She filled and is using the Advair 100. Still notes a little wheeze when lying down. She is having no problems with allergy vaccine, and overall she feels satisfied to stay where she is.  March 20, 2010- Allergic rhinitis, hx nasal polyps, hx AF/ coumadin [place], starting in January. Had seen Dr Haroldine Laws for sinusitis 1 month ago- Zpak and Nasonex for sinusitis. Frontal headache has resolved. Nasal congestion especially at night. Sometimes blows nasal mucus-clear now but was yellow. Some wheeze felt in throat. Not using Advair recently. Denies dyspnea.    Preventive Screening-Counseling & Management  Alcohol-Tobacco     Smoking Status: never  Current Medications (verified): 1)  Coumadin 2 Mg  Tabs (Warfarin Sodium) .... As Directed 2)  Synthroid 100 Mcg  Tabs (Levothyroxine Sodium) .... Take 1 Tablet By Mouth Once A Day 3)  Crestor 40 Mg  Tabs (Rosuvastatin Calcium) .... Take 1 Tablet By Mouth Once A Day 4)  Cardizem La 180 Mg  Tb24 (Diltiazem Hcl Coated  Beads) .... Take 1 Tablet By Mouth Once A Day 5)  Allergy  Vaccine 1:10 Gh 6)  Advair Diskus 100-50 Mcg/dose Misc (Fluticasone-Salmeterol) .... Inhale 1 Puff Two Times A Day Rinse Mouth After Use 7)  Lotensin 20 Mg Tabs (Benazepril Hcl) .... Take 1 Tablet By Mouth Once A Day 8)  Zoloft 50 Mg Tabs (Sertraline Hcl) .... Take 1 Tab By Mouth At Bedtime 9)  Nasonex 50 Mcg/act Susp (Mometasone Furoate) .Marland Kitchen.. 1-2 Sprays in Each Nostril Once Daily 10)  Prilosec Otc 20 Mg Tbec (Omeprazole Magnesium) .... Take 1 By Mouth Once Daily  Allergies (verified): 1)  ! Biaxin 2)  ! Relafen 3)  ! Sulfa  Past History:  Past Medical History: Last updated: 02/07/2009 ATRIAL FIBRILLATION (ICD-427.31) NASAL POLYP (ICD-471.0) ALLERGIC RHINITIS (ICD-477.9) Hypothyroidism  Past Surgical History: Last updated: 02/07/2009 Sinus surgery T A H and B S O Carpal tunnel Toe surgery  Family History: Last updated: 03/24/2008 mother died age 53 from heart problems and stroke father died age 64 from heart attack 1 sibling 1 brother died age 10 from a stroke  Social History: Last updated: 02/07/2009 retired married 2 children never smoked  no etoh use Plays piano/ organ at church/ funerals  Risk Factors: Smoking Status: never (03/20/2010)  Review of Systems      See HPI       The patient complains of nasal congestion/difficulty breathing through nose and sneezing.  The patient denies shortness of breath with activity, shortness of breath at rest, productive cough, non-productive cough, coughing up blood, chest pain, irregular heartbeats, acid heartburn, indigestion, loss of appetite, weight change,  abdominal pain, difficulty swallowing, sore throat, tooth/dental problems, and headaches.    Vital Signs:  Patient profile:   75 year old female Height:      64 inches Weight:      176 pounds BMI:     30.32 O2 Sat:      97 % on Room air Pulse rate:   60 / minute BP sitting:   128 / 70  (left arm) Cuff  size:   regular  Vitals Entered By: Reynaldo Minium CMA (March 20, 2010 11:42 AM)  O2 Flow:  Room air CC: Yearly follow up visit-allergies; runny nose,sneezing,congestion.   Physical Exam  Additional Exam:  General: A/Ox3; pleasant and cooperative, NAD, SKIN: no rash, lesions NODES: no lymphadenopathy HEENT: [place]/AT, EOM- WNL, Conjuctivae- clear, PERRLA, TM-WNL, Nose- turbinate edema, Throat- clear and wnl, Mallampati  III NECK: Supple w/ fair ROM, JVD- none, normal carotid impulses w/o bruits Thyroid- CHEST: Clear to P&A, no cough or wheeze HEART: IRR, no m/g/r heard, good rate control ABDOMEN: NFA:OZHY, nl pulses, no edema  NEURO: Grossly intact to observation        Impression & Recommendations:  Problem # 1:  ALLERGIC RHINITIS (ICD-477.9)  Question rhinosinusitis this Spring. Onset was early for tree pollen season and she probbly began with a URI, then sustained sinusitis. She has continued allergy vaccine.and nasal steroid. Sudafed minimized because of her AF. Plan- Neti pot, limited CT sinuses, neb and depo Her updated medication list for this problem includes:    Nasonex 50 Mcg/act Susp (Mometasone furoate) .Marland Kitchen... 1-2 sprays in each nostril once daily  Problem # 2:  COUGH (ICD-786.2)  Not actively complaining now. She is not needing Advair or rescue inhaler. Watch for pattern later if needed.  Problem # 3:  NASAL POLYP (ICD-471.0) Hx of nasal polyps. We have discussed mechanical sinus blockage.  Medications Added to Medication List This Visit: 1)  Nasonex 50 Mcg/act Susp (Mometasone furoate) .Marland Kitchen.. 1-2 sprays in each nostril once daily 2)  Prilosec Otc 20 Mg Tbec (Omeprazole magnesium) .... Take 1 by mouth once daily  Other Orders: Est. Patient Level III (86578) Radiology Referral (Radiology) TLB-BMP (Basic Metabolic Panel-BMET) (80048-METABOL) Admin of Therapeutic Inj  intramuscular or subcutaneous (46962) Depo- Medrol 80mg  (J1040) Nebulizer Tx (95284)  Patient  Instructions: 1)  Please schedule a follow-up appointment in 1 month. 2)  Try Neti pot saline rinse to help clear nasal congestion 3)  neb neo nasal 4)  depo 80 5)  A Limited Sinus CT has been recommended.  Your imaging study may require preauthorization.      Medication Administration  Injection # 1:    Medication: Depo- Medrol 80mg     Diagnosis: ALLERGIC RHINITIS (ICD-477.9)    Route: SQ    Site: LUOQ gluteus    Exp Date: 01/2013    Lot #: [id number]    Mfr: Pharmacia    Patient tolerated injection without complications    Given by: Reynaldo Minium CMA (March 20, 2010 1:34 PM)  Medication # 1:    Medication: EMR miscellaneous medications    Diagnosis: ALLERGIC RHINITIS (ICD-477.9)    Dose: 3 drops    Route: inhaled    Exp Date: 07/2011    Lot #: [id number]    Mfr: Bayer    Comments: Neo-Synephrine    Patient tolerated medication without complications    Given by: Reynaldo Minium CMA (March 20, 2010 1:34 PM)  Orders Added: 1)  Est. Patient Level III [02725] 2)  Radiology Referral [  Radiology] 3)  TLB-BMP (Basic Metabolic Panel-BMET) [80048-METABOL] 4)  Admin of Therapeutic Inj  intramuscular or subcutaneous [96372] 5)  Depo- Medrol 80mg  [J1040] 6)  Nebulizer Tx [16109]

## 2010-11-04 NOTE — Miscellaneous (Signed)
Summary: Injection Record / [facility]    Injection Record / [facility]    Imported By: Lennie Odor 06/06/2010 11:46:28  _____________________________________________________________________  External Attachment:    Type:   Image     Comment:   External Document

## 2010-11-04 NOTE — Assessment & Plan Note (Signed)
Summary: 1 month/ mbw   CC:  1 month follow up.  Pt states she is a lot better from last OV.  No complaints.  .  History of Present Illness: 02/07/09 Allergic rhinitis, hx nasal polyps, hx AF/ coumadin Continues allergy vaccine here 1:10 GH. Main c/o is cough since January with some wheeze, but nonproductive. Went to Urgent Care at onset with fever treated then with augmentin. Cough wakes her and she gets up to drink. Denies postnasal drip. Takes Prilosec to prevent heartburn.  03/20/09- Allergic rhinitis, hx nasal polyps, hx AF/ coumadin Since here last month, cough almost gone and dry. She filled and is using the Advair 100. Still notes a little wheeze when lying down. She is having no problems with allergy vaccine, and overall she feels satisfied to stay where she is.  March 20, 2010- Allergic rhinitis, hx nasal polyps, hx AF/ coumadin [place], starting in January. Had seen Dr Haroldine Laws for sinusitis 1 month ago- Zpak and Nasonex for sinusitis. Frontal headache has resolved. Nasal congestion especially at night. Sometimes blows nasal mucus-clear now but was yellow. Some wheeze felt in throat. Not using Advair recently. Denies dyspnea.  April 28, 2010- Allergic rhinitis, hx nasal polyps, hx AF/ coumadin She has felt better since the Spring. Her sinuses have been better. The heat confines her this week.  CT sinus in June showed acute and chronic pansinusitis. We called in Augmentin for 7 days, which did help. She remains stuffy without sneeze. Some left frontal headache. Occasional rattly upper airway cough, not productive.     Preventive Screening-Counseling & Management  Alcohol-Tobacco     Smoking Status: never  Current Medications (verified): 1)  Coumadin 2 Mg  Tabs (Warfarin Sodium) .... As Directed 2)  Synthroid 100 Mcg  Tabs (Levothyroxine Sodium) .... Take 1 Tablet By Mouth Once A Day 3)  Crestor 40 Mg  Tabs (Rosuvastatin Calcium) .... Take 1 Tablet By Mouth Once A Day 4)   Cardizem La 180 Mg  Tb24 (Diltiazem Hcl Coated Beads) .... Take 1 Tablet By Mouth Once A Day 5)  Allergy  Vaccine 1:10 Gh 6)  Advair Diskus 100-50 Mcg/dose Misc (Fluticasone-Salmeterol) .... Inhale 1 Puff Two Times A Day Rinse Mouth After Use As Needed 7)  Lotensin 20 Mg Tabs (Benazepril Hcl) .... Take 1 Tablet By Mouth Once A Day 8)  Zoloft 50 Mg Tabs (Sertraline Hcl) .... Take 1 Tab By Mouth At Bedtime 9)  Nasonex 50 Mcg/act Susp (Mometasone Furoate) .Marland Kitchen.. 1-2 Sprays in Each Nostril Once Daily 10)  Prilosec Otc 20 Mg Tbec (Omeprazole Magnesium) .... Take 1 By Mouth Once Daily  Allergies (verified): 1)  ! Biaxin 2)  ! Relafen 3)  ! Sulfa  Past History:  Past Surgical History: Last updated: 02/07/2009 Sinus surgery T A H and B S O Carpal tunnel Toe surgery  Family History: Last updated: 03/29/2008 mother died age 19 from heart problems and stroke father died age 49 from heart attack 1 sibling 1 brother died age 72 from a stroke  Social History: Last updated: 04/28/2010 retired married- husband has Alzheimer's 2 children never smoked  no etoh use Plays piano/ organ at church/ funerals  Risk Factors: Smoking Status: never (04/28/2010)  Past Medical History: ATRIAL FIBRILLATION (ICD-427.31) NASAL POLYP (ICD-471.0) ALLERGIC RHINITIS (ICD-477.9) Chronic rhinosinusitis Hypothyroidism  Social History: retired married- husband has Alzheimer's 2 children never smoked  no etoh use 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  The patient denies shortness of breath with activity, shortness of breath at rest, productive cough, non-productive cough, coughing up blood, chest pain, irregular heartbeats, acid heartburn, indigestion, loss of appetite, weight change, abdominal pain, difficulty swallowing, sore throat, tooth/dental problems, and headaches.      Vital Signs:  Patient profile:   75 year old female Height:      64 inches Weight:      177.38 pounds BMI:     30.56 O2 Sat:      96 % on Room air Pulse rate:   71 / minute BP sitting:   130 / 78  (left arm) Cuff size:   regular  Vitals Entered By: Gweneth Dimitri RN (April 28, 2010 2:29 PM)  O2 Flow:  Room air CC: 1 month follow up.  Pt states she is a lot better from last OV.  No complaints.   Comments Medications reviewed with patient Daytime contact number verified with patient. Gweneth Dimitri RN  April 28, 2010 2:29 PM    Physical Exam  Additional Exam:  General: A/Ox3; pleasant and cooperative, NAD, SKIN: no rash, lesions NODES: no lymphadenopathy HEENT: [place]/AT, EOM- WNL, Conjuctivae- clear, PERRLA, TM-WNL, Nose- turbinate edema, Throat- clear and wnl, Mallampati  III, no drainage NECK: Supple w/ fair ROM, JVD- none, normal carotid impulses w/o bruits Thyroid- CHEST: Clear to P&A, no cough or wheeze HEART: IRR, no m/g/r heard, good rate control ABDOMEN: overweight ZOX:WRUE, nl pulses, no edema  NEURO: Grossly intact to observation        CT of Sinus  Procedure date:  03/24/2010  Findings:      CT SINUS LTD W/O CM - [id number]   Clinical Data: Allergic rhinitis.  Nasal polyp.   CT PARANASAL SINUS LIMITED WITHOUT CONTRAST   Technique:  Multidetector CT images of the paranasal sinuses were obtained in a single plane without contrast.   Comparison: None.   Findings: There is opacification of the left aspect of the frontal sinus and mucosal thickening involving the right aspect of the frontal sinus.  There is ethmoid sinus opacification.  There is mucosal thickening of the sphenoid sinus. There is an air-fluid level in the right aspect of the sphenoid sinus.  There is bilateral maxillary sinus mucosal thickening and an air-fluid level on the left and possibly on the right.  Mucosal disease involving the superior aspect of the nasal cavity.  No specific  nasal polyp is appreciated.  The bony sinus walls are intact.   IMPRESSION: Pansinusitis with chronic and probable acute changes.   Read By:  Jonne Ply,  M.D.     Released By:  Jonne Ply,  M.D.  ____  Impression & Recommendations:  Problem # 1:  RHINOSINUSITIS, CHRONIC (ICD-473.8) Pansinusitis. We will give her an extended trial of augmentin as she continues her Neti pot.   Problem # 2:  COUGH (ICD-786.2)  She notices mild occasional cough, probably from sinus drainage,  but not active asthma.  Problem # 3:  NASAL POLYP (ICD-471.0) No polyps recently. She may need surgery some day if they recur. We have discussed management.l  Medications Added to Medication List This Visit: 1)  Advair Diskus 100-50 Mcg/dose Misc (Fluticasone-salmeterol) .... Inhale 1 puff two times a day rinse mouth after use as needed 2)  Amoxicillin-pot Clavulanate 875-125 Mg Tabs (Amoxicillin-pot clavulanate) .Marland Kitchen.. 1 twice daily x 2 weeks  Other Orders: Est. Patient Level IV (16010)  Patient Instructions: 1)  Please schedule a follow-up appointment in 6 months. 2)  Script sent for antibiotic 3)  Use your Neti pot some to try to keep your nose working better. Prescriptions: AMOXICILLIN-POT CLAVULANATE 875-125 MG TABS (AMOXICILLIN-POT CLAVULANATE) 1 twice daily x 2 weeks  #28 x 0   Entered and Authorized by:   Waymon Budge MD   Signed by:   Waymon Budge MD on 04/28/2010   Method used:   Electronically to        [facility]  [address] (retail)       [address]       [place], [place]  [address]       Ph: [phone] or [phone]       Fax: [phone]   RxID:   [id number]      CT of Sinus  Procedure date:  03/24/2010  Findings:      CT SINUS LTD W/O CM - [id number]   Clinical Data: Allergic rhinitis.  Nasal polyp.   CT PARANASAL SINUS LIMITED WITHOUT CONTRAST   Technique:  Multidetector CT images of the paranasal sinuses were obtained in a  single plane without contrast.   Comparison: None.   Findings: There is opacification of the left aspect of the frontal sinus and mucosal thickening involving the right aspect of the frontal sinus.  There is ethmoid sinus opacification.  There is mucosal thickening of the sphenoid sinus. There is an air-fluid level in the right aspect of the sphenoid sinus.  There is bilateral maxillary sinus mucosal thickening and an air-fluid level on the left and possibly on the right.  Mucosal disease involving the superior aspect of the nasal cavity.  No specific nasal polyp is appreciated.  The bony sinus walls are intact.   IMPRESSION: Pansinusitis with chronic and probable acute changes.   Read By:  Jonne Ply,  M.D.     Released By:  Jonne Ply,  M.D.  ____

## 2010-11-04 NOTE — Miscellaneous (Signed)
Summary: Injection Record / [facility]    Injection Record / [facility]    Imported By: Lennie Odor 02/25/2010 15:23:07  _____________________________________________________________________  External Attachment:    Type:   Image     Comment:   External Document

## 2010-11-07 DIAGNOSIS — J301 Allergic rhinitis due to pollen: Secondary | ICD-10-CM

## 2010-11-14 DIAGNOSIS — J301 Allergic rhinitis due to pollen: Secondary | ICD-10-CM

## 2010-11-20 ENCOUNTER — Ambulatory Visit (INDEPENDENT_AMBULATORY_CARE_PROVIDER_SITE_OTHER): Payer: MEDICARE | Admitting: Internal Medicine

## 2010-11-20 ENCOUNTER — Ambulatory Visit (INDEPENDENT_AMBULATORY_CARE_PROVIDER_SITE_OTHER): Payer: MEDICARE

## 2010-11-20 ENCOUNTER — Encounter: Payer: Self-pay | Admitting: Internal Medicine

## 2010-11-20 DIAGNOSIS — K219 Gastro-esophageal reflux disease without esophagitis: Secondary | ICD-10-CM

## 2010-11-20 DIAGNOSIS — R059 Cough, unspecified: Secondary | ICD-10-CM

## 2010-11-20 DIAGNOSIS — J301 Allergic rhinitis due to pollen: Secondary | ICD-10-CM

## 2010-11-20 DIAGNOSIS — R05 Cough: Secondary | ICD-10-CM

## 2010-11-20 DIAGNOSIS — J309 Allergic rhinitis, unspecified: Secondary | ICD-10-CM

## 2010-11-20 DIAGNOSIS — R0602 Shortness of breath: Secondary | ICD-10-CM | POA: Insufficient documentation

## 2010-11-28 ENCOUNTER — Ambulatory Visit (INDEPENDENT_AMBULATORY_CARE_PROVIDER_SITE_OTHER): Payer: MEDICARE

## 2010-11-28 DIAGNOSIS — J301 Allergic rhinitis due to pollen: Secondary | ICD-10-CM

## 2010-12-05 ENCOUNTER — Ambulatory Visit (INDEPENDENT_AMBULATORY_CARE_PROVIDER_SITE_OTHER): Payer: MEDICARE

## 2010-12-05 ENCOUNTER — Encounter: Payer: Self-pay | Admitting: Internal Medicine

## 2010-12-05 DIAGNOSIS — J301 Allergic rhinitis due to pollen: Secondary | ICD-10-CM

## 2010-12-11 NOTE — Assessment & Plan Note (Signed)
Summary: rov 6 month/kp   CC:  6 month follow up visit-allergies; having wheezing/rattles at times; sinus issues-not much headache as ususal..  History of Present Illness:  March 20, 2010- Allergic rhinitis, hx nasal polyps, hx AF/ coumadin Worse Spring, starting in January. Had seen Dr Haroldine Laws for sinusitis 1 month ago- Zpak and Nasonex for sinusitis. Frontal headache has resolved. Nasal congestion especially at night. Sometimes blows nasal mucus-clear now but was yellow. Some wheeze felt in throat. Not using Advair recently. Denies dyspnea.  April 28, 2010- Allergic rhinitis, hx nasal polyps, hx AF/ coumadin She has felt better since the Spring. Her sinuses have been better. The heat confines her this week.  CT sinus in June showed acute and chronic pansinusitis. We called in Augmentin for 7 days, which did help. She remains stuffy without sneeze. Some left frontal headache. Occasional rattly upper airway cough, not productive.  November 20, 2010-  Allergic rhinitis, hx nasal polyps, hx AF/ coumadin, GERD Nurse-CC: 6 month follow up visit-allergies; having wheezing/rattles at times; sinus issues-not much headache as ususal. More throat congestion, wheeze, cough over last couple of months. Worse at night lying down, without sudden choke or strangle recently. Does need daily Prilosec to suppress her heartburn. Has been out of Advair x 3 months. Has not had rescue inhaler.  Some nasal stuffy lying down, mouth breathing. Denies productive cough or sneeze. Allergy vaccine here 1:10. Hx AFib w/o med change- feet do swell some.    Preventive Screening-Counseling & Management  Alcohol-Tobacco     Smoking Status: never     Passive Smoke Exposure: yes  Comments: Husband smokes  Current Medications (verified): 1)  Coumadin 2 Mg  Tabs (Warfarin Sodium) .... As Directed 2)  Synthroid 100 Mcg  Tabs (Levothyroxine Sodium) .... Take 1 Tablet By Mouth Once A Day 3)  Crestor 40 Mg  Tabs  (Rosuvastatin Calcium) .... Take 1 Tablet By Mouth Once A Day 4)  Cardizem La 180 Mg  Tb24 (Diltiazem Hcl Coated Beads) .... Take 1 Tablet By Mouth Once A Day 5)  Allergy  Vaccine 1:10 Gh 6)  Advair Diskus 100-50 Mcg/dose Misc (Fluticasone-Salmeterol) .... Inhale 1 Puff Two Times A Day Rinse Mouth After Use As Needed 7)  Lotensin 20 Mg Tabs (Benazepril Hcl) .... Take 1 Tablet By Mouth Once A Day 8)  Zoloft 50 Mg Tabs (Sertraline Hcl) .... Take 1 Tab By Mouth At Bedtime 9)  Nasonex 50 Mcg/act Susp (Mometasone Furoate) .Marland Kitchen.. 1-2 Sprays in Each Nostril Once Daily 10)  Prilosec Otc 20 Mg Tbec (Omeprazole Magnesium) .... Take 1 By Mouth Once Daily  Allergies (verified): 1)  ! Biaxin 2)  ! Relafen 3)  ! Sulfa  Past History:  Past Medical History: Last updated: 04/28/2010 ATRIAL FIBRILLATION (ICD-427.31) NASAL POLYP (ICD-471.0) ALLERGIC RHINITIS (ICD-477.9) Chronic rhinosinusitis Hypothyroidism  Past Surgical History: Last updated: 02/07/2009 Sinus surgery T A H and B S O Carpal tunnel Toe surgery  Family History: Last updated: 04-03-2008 mother died age 59 from heart problems and stroke father died age 75 from heart attack 1 sibling 1 brother died age 32 from a stroke  Social History: Last updated: 04/28/2010 retired married- husband has Alzheimer's 2 children never smoked  no etoh use Plays piano/ organ at church/ funerals  Risk Factors: Smoking Status: never (11/20/2010) Passive Smoke Exposure: yes (11/20/2010)  Social History: Passive Smoke Exposure:  yes  Review of Systems      See HPI       The patient  complains of shortness of breath with activity, non-productive cough, and nasal congestion/difficulty breathing through nose.  The patient denies shortness of breath at rest, productive cough, coughing up blood, chest pain, irregular heartbeats, acid heartburn, indigestion, loss of appetite, weight change, abdominal pain, difficulty swallowing, sore throat,  tooth/dental problems, headaches, sneezing, itching, ear ache, rash, change in color of mucus, and fever.    Vital Signs:  Patient profile:   75 year old female Height:      64 inches Weight:      180.25 pounds BMI:     31.05 O2 Sat:      97 % on Room air Pulse rate:   67 / minute BP sitting:   138 / 80  (right arm) Cuff size:   regular  Vitals Entered By: Reynaldo Minium CMA (November 20, 2010 9:21 AM)  O2 Flow:  Room air CC: 6 month follow up visit-allergies; having wheezing/rattles at times; sinus issues-not much headache as ususal.   Physical Exam  Additional Exam:  General: A/Ox3; pleasant and cooperative, NAD, exam is similar to next visit. SKIN: no rash, lesions NODES: no lymphadenopathy HEENT: [place]ring[place]/AT, EOM- WNL, Conjuctivae- clear, PERRLA, TM-WNL, Nose- turbinate edema, Throat- clear and wnl, Mallampati  III, no drainage NECK: Supple w/ fair ROM, JVD- none, normal carotid impulses w/o bruits Thyroid- CHEST: Clear to P&A, no cough or wheeze HEART: IRR, no m/g/r heard, good rate control ABDOMEN: overweight ZOX:WRUE, nl pulses, no edema  NEURO: Grossly intact to observation        Impression & Recommendations:  Problem # 1:  ALLERGIC RHINITIS (ICD-477.9)  There is some mild increase in nasal congestion without infection or visible polyps. We will add a nasal steroid.  Her updated medication list for this problem includes:    Nasonex 50 Mcg/act Susp (Mometasone furoate) .Marland Kitchen... 1-2 sprays in each nostril once daily  Problem # 2:  DYSPNEA (ICD-786.05)  She notices a change that could be from asthma or mild fluid retention. Husband smokes, but I don't hear enough to trigger PFT right away. We will let her try Advair again.  Problem # 3:  GERD (ICD-530.81)  Newly reported issue. We discussed interaction with respiratory symptoms.  Her updated medication list for this problem includes:    Prilosec Otc 20 Mg Tbec (Omeprazole magnesium) .Marland Kitchen... Take 1 by mouth once  daily  Medications Added to Medication List This Visit: 1)  Proair Hfa 108 (90 Base) Mcg/act Aers (Albuterol sulfate) .... 2 puffs four times a day as needed rescue inhaler  Other Orders: Est. Patient Level IV (45409)  Patient Instructions: 1)  Please schedule a follow-up appointment in 2 months. 2)  Sample/ script Nasonex nasal steroid spray 3)     1-2 puffs each nostril once daily at bedtime 4)  Sample/ script rescue inhaler- like Proair- 5)    2 puffs, up to 4 times daily, as needed 6)  Sample/ script Advair discus 100/50 7)     1 puff and rinse mouth, twice every day 8)  Continue allergy vaccine Prescriptions: PROAIR HFA 108 (90 BASE) MCG/ACT AERS (ALBUTEROL SULFATE) 2 puffs four times a day as needed rescue inhaler  #1 x prn   Entered and Authorized by:   Waymon Budge MD   Signed by:   Waymon Budge MD on 11/20/2010   Method used:   Print then Give to Patient   RxID:   [id number] NASONEX 50 MCG/ACT SUSP (MOMETASONE FUROATE) 1-2 sprays in each nostril once daily  #  1 x prn   Entered and Authorized by:   Waymon Budge MD   Signed by:   Waymon Budge MD on 11/20/2010   Method used:   Electronically to        [facility] (retail)       [address]       [place], [place]  [address]       Ph: [phone] or [phone]       Fax: [phone]   RxID:   [id number] ADVAIR DISKUS 100-50 MCG/DOSE MISC (FLUTICASONE-SALMETEROL) Inhale 1 puff two times a day RINSE MOUTH AFTER USE as needed  #1 x prn   Entered and Authorized by:   Waymon Budge MD   Signed by:   Waymon Budge MD on 11/20/2010   Method used:   Electronically to        [facility]  [address] (retail)       [address]       [place], [place]  [address]       Ph: [phone] or [phone]       Fax: [phone]   RxID:   [id number]

## 2010-12-11 NOTE — Assessment & Plan Note (Signed)
Summary: ALLERGY/CB  Nurse Visit   Allergies: 1)  ! Biaxin 2)  ! Relafen 3)  ! Sulfa  Orders Added: 1)  Allergy Injection (1) [40981]

## 2010-12-12 ENCOUNTER — Encounter: Payer: Self-pay | Admitting: Internal Medicine

## 2010-12-12 ENCOUNTER — Ambulatory Visit (INDEPENDENT_AMBULATORY_CARE_PROVIDER_SITE_OTHER): Payer: MEDICARE

## 2010-12-12 DIAGNOSIS — J301 Allergic rhinitis due to pollen: Secondary | ICD-10-CM

## 2010-12-16 NOTE — Assessment & Plan Note (Signed)
Summary: ALLERGY/CB  Nurse Visit   Allergies: 1)  ! Biaxin 2)  ! Relafen 3)  ! Sulfa  Orders Added: 1)  Allergy Injection (1) [95115] 

## 2010-12-18 ENCOUNTER — Ambulatory Visit (INDEPENDENT_AMBULATORY_CARE_PROVIDER_SITE_OTHER): Payer: MEDICARE

## 2010-12-18 ENCOUNTER — Encounter: Payer: Self-pay | Admitting: Internal Medicine

## 2010-12-18 DIAGNOSIS — J301 Allergic rhinitis due to pollen: Secondary | ICD-10-CM

## 2010-12-23 NOTE — Assessment & Plan Note (Signed)
Summary: ALLERGY/CB  Nurse Visit   Allergies: 1)  ! Biaxin 2)  ! Relafen 3)  ! Sulfa  Orders Added: 1)  Allergy Injection (1) [95115]   Orders Added: 1)  Allergy Injection (1) [04540]

## 2010-12-26 ENCOUNTER — Ambulatory Visit (INDEPENDENT_AMBULATORY_CARE_PROVIDER_SITE_OTHER): Payer: MEDICARE

## 2010-12-26 DIAGNOSIS — J301 Allergic rhinitis due to pollen: Secondary | ICD-10-CM

## 2011-01-01 ENCOUNTER — Ambulatory Visit (INDEPENDENT_AMBULATORY_CARE_PROVIDER_SITE_OTHER): Payer: MEDICARE

## 2011-01-01 DIAGNOSIS — J301 Allergic rhinitis due to pollen: Secondary | ICD-10-CM

## 2011-01-07 ENCOUNTER — Ambulatory Visit (INDEPENDENT_AMBULATORY_CARE_PROVIDER_SITE_OTHER): Payer: MEDICARE

## 2011-01-07 DIAGNOSIS — J301 Allergic rhinitis due to pollen: Secondary | ICD-10-CM

## 2011-01-11 LAB — DIFFERENTIAL
Lymphs Abs: 1.9 10*3/uL (ref 0.7–4.0)
Monocytes Absolute: 0.6 10*3/uL (ref 0.1–1.0)
Monocytes Relative: 9 % (ref 3–12)
Neutro Abs: 4.3 10*3/uL (ref 1.7–7.7)
Neutrophils Relative %: 62 % (ref 43–77)

## 2011-01-11 LAB — BASIC METABOLIC PANEL
CO2: 27 mEq/L (ref 19–32)
Calcium: 8.6 mg/dL (ref 8.4–10.5)
Chloride: 108 mEq/L (ref 96–112)
Creatinine, Ser: 0.63 mg/dL (ref 0.4–1.2)
GFR calc Af Amer: >60 mL/min (ref >60)
Sodium: 141 mEq/L (ref 135–145)

## 2011-01-11 LAB — URINALYSIS, ROUTINE W REFLEX MICROSCOPIC
Glucose, UA: NEGATIVE mg/dL
Nitrite: POSITIVE — AB
Specific Gravity, Urine: 1.008 (ref 1.005–1.030)
pH: 7 (ref 5.0–8.0)

## 2011-01-11 LAB — CBC
Hemoglobin: 13.2 g/dL (ref 12.0–15.0)
RBC: 4.27 MIL/uL (ref 3.87–5.11)
WBC: 7 10*3/uL (ref 4.0–10.5)

## 2011-01-11 LAB — URINE CULTURE: Colony Count: >=100000

## 2011-01-11 LAB — URINE MICROSCOPIC-ADD ON

## 2011-01-11 LAB — PROTIME-INR: INR: 2 — ABNORMAL HIGH (ref 0.00–1.49)

## 2011-01-16 ENCOUNTER — Ambulatory Visit (INDEPENDENT_AMBULATORY_CARE_PROVIDER_SITE_OTHER): Payer: MEDICARE

## 2011-01-16 DIAGNOSIS — J309 Allergic rhinitis, unspecified: Secondary | ICD-10-CM

## 2011-01-19 ENCOUNTER — Encounter: Payer: Self-pay | Admitting: Internal Medicine

## 2011-01-20 ENCOUNTER — Ambulatory Visit (INDEPENDENT_AMBULATORY_CARE_PROVIDER_SITE_OTHER): Payer: MEDICARE | Admitting: Internal Medicine

## 2011-01-20 ENCOUNTER — Encounter: Payer: Self-pay | Admitting: Internal Medicine

## 2011-01-20 VITALS — BP 124/80 | HR 59 | Ht 64.0 in | Wt 179.0 lb

## 2011-01-20 DIAGNOSIS — R0602 Shortness of breath: Secondary | ICD-10-CM

## 2011-01-20 DIAGNOSIS — J309 Allergic rhinitis, unspecified: Secondary | ICD-10-CM

## 2011-01-20 DIAGNOSIS — R059 Cough, unspecified: Secondary | ICD-10-CM

## 2011-01-20 DIAGNOSIS — J330 Polyp of nasal cavity: Secondary | ICD-10-CM

## 2011-01-20 DIAGNOSIS — R05 Cough: Secondary | ICD-10-CM

## 2011-01-20 NOTE — Progress Notes (Signed)
  Subjective:    Patient ID: Vicki Morris, female    DOB: [birth year], 75 y.o.   MRN: [id number]  HPI 75 yo F followed for allergic rhinitis and dyspnea, complicated by GERD, atrial fib.  Last here November 20, 2010. She wheezed a little last night after spreading mulch yesterday, but otherwise has done quite well considering the pollen season. She continues allergy vaccine here at 1:10. We discussed her vaccine, and meds.  Review of Systems   see HPI Constitutional:   No weight loss, night sweats,  Fevers, chills, fatigue, lassitude. HEENT:   No headaches,  Difficulty swallowing,  Tooth/dental problems,  Sore throat,                No sneezing, itching, ear ache, nasal congestion, post nasal drip,   CV:  No chest pain,  Orthopnea, PND, swelling in lower extremities, anasarca, dizziness, palpitations  GI  No heartburn, indigestion, abdominal pain, nausea, vomiting, diarrhea, change in bowel habits, loss of appetite  Resp: No shortness of breath with exertion or at rest.  No excess mucus, no productive cough,  No non-productive cough,  No coughing up of blood.  No change in color of mucus.  No wheezing.  Skin: no rash or lesions.  GU: no dysuria, change in color of urine, no urgency or frequency.  No flank pain.  MS:  No joint pain or swelling.  No decreased range of motion.  No back pain.  Psych:  No change in mood or affect. No depression or anxiety.  No memory loss.   Objective:   Physical Exam General- Alert, Oriented, Affect-appropriate, Distress- none acute  Skin- rash-none, lesions- none, excoriation- none  Lymphadenopathy- none  Head- atraumatic  Eyes- Gross vision intact, PERRLA, conjunctivae clear secretions  Ears- Hearing Normal-  canals, Tm L , R ,  Nose- Clear,  No-Septal dev, mucus, polyps, erosion, perforation   Throat- Mallampati II , mucosa clear , drainage- none, tonsils- atrophic  Neck- flexible , trachea midline, no stridor , thyroid nl, carotid no  bruit  Chest - symmetrical excursion , unlabored     Heart/CV- RRR , no murmur , no gallop  , no rub, nl s1 s2                     - JVD- none , edema- none, stasis changes- none, varices- none     Lung- clear to P&A, wheeze- none, cough- none , dullness-none, rub- none     Chest wall-   Abd- tender-no, distended-no, bowel sounds-present, HSM- no  Br/ Gen/ Rectal- Not done, not indicated  Extrem- cyanosis- none, clubbing, none, atrophy- none, strength- nl  Neuro- grossly intact to observation         Assessment & Plan:

## 2011-01-20 NOTE — Assessment & Plan Note (Signed)
She has a rescue inhaler if needed, as with the dusty mulch exposure last night. She is clear now and cona be watched.

## 2011-01-20 NOTE — Assessment & Plan Note (Signed)
Doing very well so far this Spring. We can continue her allergy vaccine with no changes

## 2011-01-20 NOTE — Patient Instructions (Signed)
I am glad you are doing so well. Please call as needed.   We are continuing allergy vaccine at 1:10.

## 2011-01-23 ENCOUNTER — Ambulatory Visit (INDEPENDENT_AMBULATORY_CARE_PROVIDER_SITE_OTHER): Payer: MEDICARE

## 2011-01-23 DIAGNOSIS — J309 Allergic rhinitis, unspecified: Secondary | ICD-10-CM

## 2011-01-25 ENCOUNTER — Encounter: Payer: Self-pay | Admitting: Internal Medicine

## 2011-01-25 NOTE — Assessment & Plan Note (Signed)
No recurrence. 

## 2011-01-25 NOTE — Assessment & Plan Note (Signed)
Minor cough/ wheeze only after strong stimulus- putting out mulch w/o mask. She will be more carefull next time. She is satisfied with her meds.

## 2011-01-30 ENCOUNTER — Ambulatory Visit (INDEPENDENT_AMBULATORY_CARE_PROVIDER_SITE_OTHER): Payer: MEDICARE

## 2011-01-30 DIAGNOSIS — J309 Allergic rhinitis, unspecified: Secondary | ICD-10-CM

## 2011-02-05 ENCOUNTER — Ambulatory Visit (INDEPENDENT_AMBULATORY_CARE_PROVIDER_SITE_OTHER): Payer: MEDICARE

## 2011-02-05 DIAGNOSIS — J309 Allergic rhinitis, unspecified: Secondary | ICD-10-CM

## 2011-02-13 ENCOUNTER — Ambulatory Visit (INDEPENDENT_AMBULATORY_CARE_PROVIDER_SITE_OTHER): Payer: MEDICARE

## 2011-02-13 DIAGNOSIS — J309 Allergic rhinitis, unspecified: Secondary | ICD-10-CM

## 2011-02-17 NOTE — Assessment & Plan Note (Signed)
[facility]                             PULMONARY OFFICE NOTE   Vicki Morris, Vicki Morris                     MRN:          [id number]  DATE:02/21/2007                            DOB:          [birth year]    PROBLEM:  1. Allergic rhinitis.  2. Nasal polyposis.  3. Atrial fibrillation, cerebrovascular accident, Coumadin.   HISTORY:  Dr. Haroldine Laws treated sinusitis about 2 months ago with a Z-  Pak.  She continues allergy vaccine at 1:10 with no problems.  She is  having some mild nasal congestion, nothing purulent.  No headache, no  fever. Chest has been quit.   MEDICATIONS:  1. Allergy vaccine.  2. Lotensin/hydrochlorothiazide.  3. Coumadin.  4. Synthroid.  5. Cardizem 180 mg.  6. Zoloft 50 mg.  7. Fosamax 70 mg.  8. Hormones.  9. Crestor 40 mg.   DRUG INTOLERANCE:  BIAXIN, RELAFEN, SULFA.   OBJECTIVE:  VITAL SIGNS: Weight 153 pounds.  Blood pressure 124/78,  pulse 50, room air saturation 96%.  HEENT:  Mild septal deviation and narrowing of the nasal airway.  I do  not see polyps.  There is no post nasal drainage.  She does not sound  congested.  There is no stridor.  CHEST:  Chest is clear.  HEART:  No murmur or gallop.  EXTREMITIES:  No edema.   IMPRESSION:  Rhinitis with an allergic component.  I am not sure that a  Z-Pak would have cleared a really established sinusitis.   PLAN:  Nasal saline lavage was discussed for p.r.n. use.  She wants to  see how she does as the season progresses and return in 6 months,  earlier p.r.n.     [name]. Maple Hudson, MD, Tonny Bollman, FACP  Electronically Signed    CDY/MedQ  DD: 02/28/2007  DT: 02/28/2007  Job #: [id number]   cc:   Brooke Bonito, M.D.

## 2011-02-17 NOTE — Assessment & Plan Note (Signed)
[facility]                             PULMONARY OFFICE NOTE   Vicki Morris, Vicki Morris                     MRN:          [id number]  DATE:08/22/2007                            DOB:          1936/07/16    PROBLEM LIST:  1. Allergic rhinitis.  2. Nasal polyposis.  3. Atrial fibrillation, cerebrovascular accident, Coumadin.   HISTORY:  Struggling to care for her husband who has medical problems,  increased nasal congestion, had pneumonia vaccine, no recent chest  complaints.  She continues allergy vaccine without problems and had flu  vaccine earlier this year.   MEDICATIONS:  1. Allergy vaccine.  2. Lotensin/HCTZ 20 mg.  3. Coumadin.  4. Synthroid 0.1 mcg.  5. Cardizem 180 mg.  6. Zoloft 50 mg.  7. Fosamax 70 mg.  8. Estradiol.  9. Crestor 40 mg.   DRUG INTOLERANT:  1. BIAXIN.  2. RELAFEN.  3. SULFA.   OBJECTIVE:  VITAL SIGNS:  Weight 160 pounds, BP 122/74, pulse 57, room  air saturation 98%.  HEENT:  Narrow nasal airway, septal deviation.  I do not see polyps.  Pharynx is clear.  Voice quality normal.  No stridor or neck vein  distention.  CHEST:  Clear without cough or wheeze.  HEART:  Sounds are regular without murmur or gallop.  EXTREMITIES:  There is no cyanosis or edema.   IMPRESSION:  1. Allergic rhinitis, currently stable.  2. Some mechanical affect from mild nasal septal deviation.   PLAN:  1. Saline gel.  2. Use Afrin p.r.n. very occasionally as discussed.  3. Schedule return in 6 months, earlier p.r.n.     [name]. Maple Hudson, MD, Tonny Bollman, FACP  Electronically Signed    CDY/MedQ  DD: 08/28/2007  DT: 08/28/2007  Job #: [id number]   cc:   Brooke Bonito, M.D.

## 2011-02-20 ENCOUNTER — Ambulatory Visit (INDEPENDENT_AMBULATORY_CARE_PROVIDER_SITE_OTHER): Payer: Medicare Other

## 2011-02-20 DIAGNOSIS — J309 Allergic rhinitis, unspecified: Secondary | ICD-10-CM

## 2011-02-20 NOTE — Op Note (Signed)
NAMETAWNEY, VANORMAN              ACCOUNT NO.:  [id number]   MEDICAL RECORD NO.:  [id number]          PATIENT TYPE:  AMB   LOCATION:  ENDO                         FACILITY:  [facility]   PHYSICIAN:  Georgiana Spinner, M.D.    DATE OF BIRTH:  [birth year]   DATE OF PROCEDURE:  07/28/2005  DATE OF DISCHARGE:  07/28/2005                                 OPERATIVE REPORT   PROCEDURE PERFORMED:  Upper endoscopy.   INDICATIONS:  Gastroesophageal reflux disease.   ANESTHESIA:  Demerol 40 mg, Versed 6 mg.   PROCEDURE:  With the patient mildly sedated in the left lateral decubitus  position, the Olympus videoscopic endoscope was inserted in the mouth and  passed under direct vision through the esophagus; which appeared normal  until we reached the distal esophagus.  This was photographed and biopsied.  We then entered into the stomach; fundus, body, antrum, duodenal bulb,  second portion of duodenum were visualized.  From this point the endoscope  was slowly withdrawn, taking circumferential views of the duodenal mucosa;  until the endoscope had been pulled back into the stomach and placed in  retroflexion, to view the stomach from below.  The endoscope was  straightened and withdrawn, taking circumferential views of the remaining  gastric and esophageal mucosa. The patient's vital signs and pulse oximetry  remained stable. The patient tolerated the procedure well and without  apparent complications.   FINDINGS:  Area of distal esophagus, compatible with reflux; biopsied.   PLAN:  Await biopsy report. The patient will call me for results and follow-  up with me as an outpatient.  Proceed to colonoscopy as planned.           ______________________________  Georgiana Spinner, M.D.     GMO/MEDQ  D:  09/15/2005  T:  09/16/2005  Job:  [id number]

## 2011-02-20 NOTE — Op Note (Signed)
[place]  Patient:    Vicki Morris, Vicki Morris                     MRN: [id number] Proc. Date: 03/16/00 Adm. Date:  [id number] Disc. Date: [id number] Attending:  Colon Branch CC:         Clydene Fake, M.D.                           Operative Report  PREOPERATIVE DIAGNOSIS:  Left carpal tunnel syndrome.  POSTOPERATIVE DIAGNOSIS:  Left carpal tunnel syndrome.  PROCEDURE:  Left median nerve release.  SURGEON:  Clydene Fake, M.D.  ANESTHESIA:  LMA.  ESTIMATED BLOOD LOSS:  Minimal.  DRAINS:  None.  COMPLICATIONS:  None.  INDICATIONS FOR PROCEDURE:  Left carpal tunnel syndrome with pain, numbness and thenar atrophy.  EMG confirms the diagnosis.  The patient was brought in for median nerve decompression.  DESCRIPTION OF PROCEDURE:  The patient was brought to the operating room and anesthesia was induced.  The patient was prepped and draped in the usual sterile fashion.  An incision was then made over the left wrist and hemostasis was obtained with bipolar cauterization.  Dissection was taken down to the transverse ligament and this was incised carefully to get down to the median nerve.  We then placed a #4 _________ over the nerve under the ligament and completed dissection of the nerve proximal and distally.  At the extreme ends, scissors were placed around the ligaments and cut more proximally and distally.  After this, the median nerve was completely decompressed through its course.  Antibiotic irrigation was used and the incision was closed with interrupted 4-0 nylon sutures.  A dressing was then placed.  The patient was transferred to the recovery room in stable condition. DD:  03/16/00 TD:  03/19/00 Job: [id number]

## 2011-02-20 NOTE — Op Note (Signed)
NAMERAELENE, TREW              ACCOUNT NO.:  [id number]   MEDICAL RECORD NO.:  [id number]           PATIENT TYPE:   LOCATION:                               FACILITY:  [facility]   PHYSICIAN:  Georgiana Spinner, M.D.    DATE OF BIRTH:  07-05-1936   DATE OF PROCEDURE:  DATE OF DISCHARGE:                                 OPERATIVE REPORT   PROCEDURE:  Colonoscopy.   INDICATIONS:  Colon cancer screening.   ANESTHESIA:  Demerol 20 mg, Versed 2 mg.   PROCEDURE:  With the patient mildly sedated in the left lateral decubitus  position, the Olympus videoscopic colonoscope was inserted in the rectum and  passed under direct vision to the cecum, identified by ileocecal valve and  appendiceal orifice, both of which were photographed.  From this point the  colonoscope was slowly withdrawn taking circumferential views of the colonic  mucosa, stopping in the rectum, which appeared normal on direct and showed  hemorrhoids on retroflexed view.  The endoscope was straightened and  withdrawn.  The patient's vital signs and pulse oximetry remained stable.  The patient tolerated the procedure well without apparent complications.   FINDINGS:  Internal hemorrhoids, otherwise normal colonoscopic examination  to the cecum.   PLAN:  Have the patient follow up with me in five years or as needed.           ______________________________  Georgiana Spinner, M.D.     GMO/MEDQ  D:  09/15/2005  T:  09/16/2005  Job:  [id number]

## 2011-02-20 NOTE — H&P (Signed)
[place]  Patient:    Vicki Morris, Vicki Morris                       MRN: [id number] Adm. Date:  03/16/00 Attending:  Clydene Fake, M.D.                         History and Physical  CHIEF COMPLAINT:  Left hand weakness and numbness.  HISTORY:  The patient ___________ but also tingling of the right median fingers  with thenar atrophy and pain and numbness in the hand.  This has been slowly worsening over time despite rest, splints, EMG, nerve conduction ______ showed bilateral carpal tunnel syndrome, worse on the left.  MRI of the neck was also one showing spondylosis at 5-6 but no clear disc herniation.  The patient is admitted for a carpal tunnel release on the left.  PAST MEDICAL HISTORY:  Significant for hypertension, TIAs and afib.  PREVIOUS OPERATIONS:  Hysterectomy in 1983.  Sinus surgery in 1984.  Bladder surgery in 2000.  CURRENT MEDICATIONS:  Cardizem, Lotensin, Coumadin, Synthroid, Zocor, Premarin,  Zoloft, Celebrex.  DRUG ALLERGIES:  VIOXX, SULFUR and RELAFEN.  SOCIAL HISTORY:  She is married; she does not smoke or drink alcohol.  REVIEW OF SYSTEMS:  Otherwise negative.  FAMILY HISTORY:  Noncontributory.  PHYSICAL EXAMINATION:  GENERAL:  The patient is pleasant, in no apparent distress.  VITAL SIGNS:  Blood pressure 158/97 and pulse 63.  Height 5 ft 4 in.  Weight 126.  HEENT/NECK: ______ well in all direction; no paraspinal muscle spasms or tenderness.  Negative Spurling bilaterally.  Negative brachioplexus tenderness bilaterally. No pronator drift.  Motor strength shows slight weakness in the right bicep and 4+/5 is decreased in left grip; but otherwise, strength intact though there is atrophy of the thenar eminence on the left and there is a weakness there, and decreased  strength in the opponens and abduction muscles of her thumb. Sensation is decreased in the left thumb.  An x-ray of her middle finger is probably  worse in a C-6 distribution but also middle finger in a median distribution.  Tinel is positive on the left wrist and a positive Phalen bilaterally.  ASSESSMENT/PLAN:  A patient with carpal tunnel syndrome bilaterally, worse on the left, with some thenar atrophy.  The patient is admitted for a left carpal tunnel release. DD:  03/16/00 TD:  03/16/00 Job: [id number]

## 2011-02-20 NOTE — Assessment & Plan Note (Signed)
[facility]                               PULMONARY OFFICE NOTE   BRAILYNN, BRETH                       MRN:          [id number]  DATE:08/23/2006                            DOB:          March 16, 1936    PROBLEMS:  1. Allergic rhinitis.  2. Nasal polyposis.  3. Atrial fibrillation/cerebrovascular accident/Coumadin.   HISTORY:  In the past month she has had some persistent, dull, retro-orbital  headache and recently some increased rhinorrhea she attributes to allergy.  She continues allergy vaccine at 1:10, getting injections here with no  problems.  She had gotten flu vaccine.  There had been a remote history of  nasal polyps.   MEDICATION:  Allergy vaccine, Lotensin, Coumadin, Synthroid, Zocor,  Cardizem, Zoloft, Fosamax, estrogen, Crestor.  Drug intolerant to BIAXIN,  RELAFEN, and SULFA.   OBJECTIVE:  Weight 157 pounds, BP 132/80, pulse regular 61, room air  saturation 97%.  Septal deviation, no visible polyps, no postnasal drainage  or cervical adenopathy.   IMPRESSION:  Allergic rhinitis with exacerbation.  I think she is bothered  more by congestion than by frank sinus infection currently.   PLAN:  1. Nasal nebulizer with Neo-Synephrine.  2. Depo-Medrol 80 mg IM.  3. Mucinex D.  4. Nasal saline.  5. Schedule return 6 months but earlier p.r.n.     [name]. Maple Hudson, MD, Tonny Bollman, FACP  Electronically Signed    CDY/MedQ  DD: 08/24/2006  DT: 08/25/2006  Job #: [id number]   cc:   Brooke Bonito, M.D.

## 2011-02-27 ENCOUNTER — Ambulatory Visit (INDEPENDENT_AMBULATORY_CARE_PROVIDER_SITE_OTHER): Payer: Medicare Other

## 2011-02-27 DIAGNOSIS — J309 Allergic rhinitis, unspecified: Secondary | ICD-10-CM

## 2011-03-06 ENCOUNTER — Ambulatory Visit (INDEPENDENT_AMBULATORY_CARE_PROVIDER_SITE_OTHER): Payer: Medicare Other

## 2011-03-06 DIAGNOSIS — J309 Allergic rhinitis, unspecified: Secondary | ICD-10-CM

## 2011-03-13 ENCOUNTER — Ambulatory Visit (INDEPENDENT_AMBULATORY_CARE_PROVIDER_SITE_OTHER): Payer: Medicare Other

## 2011-03-13 DIAGNOSIS — J309 Allergic rhinitis, unspecified: Secondary | ICD-10-CM

## 2011-03-19 ENCOUNTER — Ambulatory Visit (INDEPENDENT_AMBULATORY_CARE_PROVIDER_SITE_OTHER): Payer: Medicare Other

## 2011-03-19 DIAGNOSIS — J309 Allergic rhinitis, unspecified: Secondary | ICD-10-CM

## 2011-03-27 ENCOUNTER — Ambulatory Visit (INDEPENDENT_AMBULATORY_CARE_PROVIDER_SITE_OTHER): Payer: Medicare Other

## 2011-03-27 DIAGNOSIS — J309 Allergic rhinitis, unspecified: Secondary | ICD-10-CM

## 2011-04-06 ENCOUNTER — Telehealth: Payer: Self-pay | Admitting: Internal Medicine

## 2011-04-06 MED ORDER — CEFDINIR 300 MG PO CAPS: 300.0000 mg | ORAL_CAPSULE | Freq: Two times a day (BID) | ORAL | Status: AC

## 2011-04-06 NOTE — Telephone Encounter (Signed)
Spoke with pt. She is c/o sinus infection- c/o HA/pressure, dry cough, and PND- green nasal d/c>onset approx 5 days ago. Would like rx called in.  Allergies  Allergen Reactions  . Clarithromycin   . Nabumetone   . Sulfonamide Derivatives

## 2011-04-06 NOTE — Telephone Encounter (Signed)
Per CDY, cefdinir 300mg  # 20 1 bid.    Called, spoke with pt.  She was informed to take cefdinir 300mg  bid and rx sent to [facility] [address]  She verbalized understanding of this.

## 2011-04-09 ENCOUNTER — Ambulatory Visit (INDEPENDENT_AMBULATORY_CARE_PROVIDER_SITE_OTHER): Payer: Medicare Other

## 2011-04-09 DIAGNOSIS — J309 Allergic rhinitis, unspecified: Secondary | ICD-10-CM

## 2011-04-17 ENCOUNTER — Ambulatory Visit (INDEPENDENT_AMBULATORY_CARE_PROVIDER_SITE_OTHER): Payer: Medicare Other

## 2011-04-17 DIAGNOSIS — J309 Allergic rhinitis, unspecified: Secondary | ICD-10-CM

## 2011-04-23 ENCOUNTER — Encounter: Payer: Self-pay | Admitting: Internal Medicine

## 2011-04-24 ENCOUNTER — Ambulatory Visit (INDEPENDENT_AMBULATORY_CARE_PROVIDER_SITE_OTHER): Payer: Medicare Other

## 2011-04-24 DIAGNOSIS — J309 Allergic rhinitis, unspecified: Secondary | ICD-10-CM

## 2011-05-01 ENCOUNTER — Ambulatory Visit (INDEPENDENT_AMBULATORY_CARE_PROVIDER_SITE_OTHER): Payer: Medicare Other

## 2011-05-01 DIAGNOSIS — J309 Allergic rhinitis, unspecified: Secondary | ICD-10-CM

## 2011-05-07 ENCOUNTER — Ambulatory Visit (INDEPENDENT_AMBULATORY_CARE_PROVIDER_SITE_OTHER): Payer: Medicare Other

## 2011-05-07 DIAGNOSIS — J309 Allergic rhinitis, unspecified: Secondary | ICD-10-CM

## 2011-05-15 ENCOUNTER — Ambulatory Visit (INDEPENDENT_AMBULATORY_CARE_PROVIDER_SITE_OTHER): Payer: Medicare Other

## 2011-05-15 DIAGNOSIS — J309 Allergic rhinitis, unspecified: Secondary | ICD-10-CM

## 2011-05-19 ENCOUNTER — Ambulatory Visit (INDEPENDENT_AMBULATORY_CARE_PROVIDER_SITE_OTHER): Payer: Medicare Other

## 2011-05-19 DIAGNOSIS — J309 Allergic rhinitis, unspecified: Secondary | ICD-10-CM

## 2011-05-29 ENCOUNTER — Ambulatory Visit (INDEPENDENT_AMBULATORY_CARE_PROVIDER_SITE_OTHER): Payer: Medicare Other

## 2011-05-29 DIAGNOSIS — J309 Allergic rhinitis, unspecified: Secondary | ICD-10-CM

## 2011-06-05 ENCOUNTER — Ambulatory Visit (INDEPENDENT_AMBULATORY_CARE_PROVIDER_SITE_OTHER): Payer: Medicare Other

## 2011-06-05 DIAGNOSIS — J309 Allergic rhinitis, unspecified: Secondary | ICD-10-CM

## 2011-06-12 ENCOUNTER — Ambulatory Visit (INDEPENDENT_AMBULATORY_CARE_PROVIDER_SITE_OTHER): Payer: Medicare Other

## 2011-06-12 DIAGNOSIS — J309 Allergic rhinitis, unspecified: Secondary | ICD-10-CM

## 2011-06-19 ENCOUNTER — Ambulatory Visit (INDEPENDENT_AMBULATORY_CARE_PROVIDER_SITE_OTHER): Payer: Medicare Other

## 2011-06-19 DIAGNOSIS — J309 Allergic rhinitis, unspecified: Secondary | ICD-10-CM

## 2011-06-26 ENCOUNTER — Ambulatory Visit (INDEPENDENT_AMBULATORY_CARE_PROVIDER_SITE_OTHER): Payer: Medicare Other

## 2011-06-26 DIAGNOSIS — J309 Allergic rhinitis, unspecified: Secondary | ICD-10-CM

## 2011-07-03 ENCOUNTER — Ambulatory Visit (INDEPENDENT_AMBULATORY_CARE_PROVIDER_SITE_OTHER): Payer: Medicare Other

## 2011-07-03 DIAGNOSIS — J309 Allergic rhinitis, unspecified: Secondary | ICD-10-CM

## 2011-07-10 ENCOUNTER — Ambulatory Visit (INDEPENDENT_AMBULATORY_CARE_PROVIDER_SITE_OTHER): Payer: Medicare Other

## 2011-07-10 DIAGNOSIS — J309 Allergic rhinitis, unspecified: Secondary | ICD-10-CM

## 2011-07-17 ENCOUNTER — Ambulatory Visit (INDEPENDENT_AMBULATORY_CARE_PROVIDER_SITE_OTHER): Payer: Medicare Other

## 2011-07-17 DIAGNOSIS — J309 Allergic rhinitis, unspecified: Secondary | ICD-10-CM

## 2011-07-24 ENCOUNTER — Ambulatory Visit (INDEPENDENT_AMBULATORY_CARE_PROVIDER_SITE_OTHER): Payer: Medicare Other

## 2011-07-24 DIAGNOSIS — J309 Allergic rhinitis, unspecified: Secondary | ICD-10-CM

## 2011-08-03 ENCOUNTER — Encounter: Payer: Self-pay | Admitting: Internal Medicine

## 2011-08-07 ENCOUNTER — Ambulatory Visit (INDEPENDENT_AMBULATORY_CARE_PROVIDER_SITE_OTHER): Payer: Medicare Other

## 2011-08-07 DIAGNOSIS — J309 Allergic rhinitis, unspecified: Secondary | ICD-10-CM

## 2011-08-13 ENCOUNTER — Ambulatory Visit (INDEPENDENT_AMBULATORY_CARE_PROVIDER_SITE_OTHER): Payer: Medicare Other

## 2011-08-13 DIAGNOSIS — J309 Allergic rhinitis, unspecified: Secondary | ICD-10-CM

## 2011-08-20 ENCOUNTER — Ambulatory Visit (INDEPENDENT_AMBULATORY_CARE_PROVIDER_SITE_OTHER): Payer: Medicare Other

## 2011-08-20 DIAGNOSIS — J309 Allergic rhinitis, unspecified: Secondary | ICD-10-CM

## 2011-08-28 ENCOUNTER — Ambulatory Visit (INDEPENDENT_AMBULATORY_CARE_PROVIDER_SITE_OTHER): Payer: Medicare Other

## 2011-08-28 DIAGNOSIS — J309 Allergic rhinitis, unspecified: Secondary | ICD-10-CM

## 2011-09-04 ENCOUNTER — Ambulatory Visit (INDEPENDENT_AMBULATORY_CARE_PROVIDER_SITE_OTHER): Payer: Medicare Other

## 2011-09-04 DIAGNOSIS — J309 Allergic rhinitis, unspecified: Secondary | ICD-10-CM

## 2011-09-11 ENCOUNTER — Ambulatory Visit (INDEPENDENT_AMBULATORY_CARE_PROVIDER_SITE_OTHER): Payer: Medicare Other

## 2011-09-11 DIAGNOSIS — J309 Allergic rhinitis, unspecified: Secondary | ICD-10-CM

## 2011-09-17 ENCOUNTER — Ambulatory Visit (INDEPENDENT_AMBULATORY_CARE_PROVIDER_SITE_OTHER): Payer: Medicare Other

## 2011-09-17 DIAGNOSIS — J309 Allergic rhinitis, unspecified: Secondary | ICD-10-CM

## 2011-09-25 ENCOUNTER — Ambulatory Visit (INDEPENDENT_AMBULATORY_CARE_PROVIDER_SITE_OTHER): Payer: Medicare Other

## 2011-09-25 DIAGNOSIS — J309 Allergic rhinitis, unspecified: Secondary | ICD-10-CM

## 2011-10-09 ENCOUNTER — Ambulatory Visit (INDEPENDENT_AMBULATORY_CARE_PROVIDER_SITE_OTHER): Payer: Medicare Other

## 2011-10-09 DIAGNOSIS — J309 Allergic rhinitis, unspecified: Secondary | ICD-10-CM

## 2011-10-16 ENCOUNTER — Ambulatory Visit (INDEPENDENT_AMBULATORY_CARE_PROVIDER_SITE_OTHER): Payer: Medicare Other

## 2011-10-16 DIAGNOSIS — J309 Allergic rhinitis, unspecified: Secondary | ICD-10-CM

## 2011-10-19 ENCOUNTER — Ambulatory Visit (INDEPENDENT_AMBULATORY_CARE_PROVIDER_SITE_OTHER): Payer: Medicare Other

## 2011-10-19 DIAGNOSIS — J309 Allergic rhinitis, unspecified: Secondary | ICD-10-CM

## 2011-10-22 ENCOUNTER — Ambulatory Visit (INDEPENDENT_AMBULATORY_CARE_PROVIDER_SITE_OTHER): Payer: Medicare Other

## 2011-10-22 DIAGNOSIS — J309 Allergic rhinitis, unspecified: Secondary | ICD-10-CM

## 2011-11-06 ENCOUNTER — Ambulatory Visit (INDEPENDENT_AMBULATORY_CARE_PROVIDER_SITE_OTHER): Payer: Medicare Other

## 2011-11-06 DIAGNOSIS — J309 Allergic rhinitis, unspecified: Secondary | ICD-10-CM

## 2011-11-13 ENCOUNTER — Encounter: Payer: Self-pay | Admitting: Internal Medicine

## 2011-11-13 ENCOUNTER — Ambulatory Visit (INDEPENDENT_AMBULATORY_CARE_PROVIDER_SITE_OTHER): Payer: Medicare Other

## 2011-11-13 DIAGNOSIS — J309 Allergic rhinitis, unspecified: Secondary | ICD-10-CM

## 2011-11-19 ENCOUNTER — Telehealth: Payer: Self-pay | Admitting: Cardiology

## 2011-11-19 NOTE — Telephone Encounter (Signed)
Patient called no answer.LMTC. 

## 2011-11-19 NOTE — Telephone Encounter (Signed)
Patient called stating she is having SOB with exertion,no chest pain.States has been going on for about 1 month.Advised Dr.Jordan not in office today. I will tell him tomorrow 11/20/11 and call her back.

## 2011-11-19 NOTE — Telephone Encounter (Signed)
New Problem   Patient would like a call from nurse regarding SOB and nausea, no dizziness at this time.  She can be reached at hm# [phone]

## 2011-11-20 ENCOUNTER — Ambulatory Visit (INDEPENDENT_AMBULATORY_CARE_PROVIDER_SITE_OTHER): Payer: Medicare Other

## 2011-11-20 DIAGNOSIS — J309 Allergic rhinitis, unspecified: Secondary | ICD-10-CM

## 2011-11-20 NOTE — Telephone Encounter (Signed)
Patient was called and given appointment with Norma Fredrickson NP 11/25/11.

## 2011-11-24 ENCOUNTER — Encounter: Payer: Self-pay | Admitting: *Deleted

## 2011-11-25 ENCOUNTER — Encounter: Payer: Self-pay | Admitting: Nurse Practitioner

## 2011-11-25 ENCOUNTER — Ambulatory Visit (INDEPENDENT_AMBULATORY_CARE_PROVIDER_SITE_OTHER): Payer: Medicare Other | Admitting: Nurse Practitioner

## 2011-11-25 VITALS — BP 138/88 | HR 55 | Ht 64.0 in | Wt 183.0 lb

## 2011-11-25 DIAGNOSIS — R0609 Other forms of dyspnea: Secondary | ICD-10-CM

## 2011-11-25 DIAGNOSIS — R0989 Other specified symptoms and signs involving the circulatory and respiratory systems: Secondary | ICD-10-CM

## 2011-11-25 DIAGNOSIS — R0602 Shortness of breath: Secondary | ICD-10-CM

## 2011-11-25 DIAGNOSIS — I4891 Unspecified atrial fibrillation: Secondary | ICD-10-CM

## 2011-11-25 DIAGNOSIS — R0600 Dyspnea, unspecified: Secondary | ICD-10-CM

## 2011-11-25 LAB — CBC WITH DIFFERENTIAL/PLATELET
Basophils Absolute: 0.1 10*3/uL (ref 0.0–0.1)
Basophils Relative: 1.1 % (ref 0.0–3.0)
Eosinophils Absolute: 0.3 10*3/uL (ref 0.0–0.7)
Eosinophils Relative: 5.6 % — ABNORMAL HIGH (ref 0.0–5.0)
HCT: 38.7 % (ref 36.0–46.0)
Hemoglobin: 13.2 g/dL (ref 12.0–15.0)
Lymphocytes Relative: 31.3 % (ref 12.0–46.0)
Lymphs Abs: 1.8 10*3/uL (ref 0.7–4.0)
MCHC: 34.1 g/dL (ref 30.0–36.0)
MCV: 87.9 fl (ref 78.0–100.0)
Monocytes Absolute: 0.6 10*3/uL (ref 0.1–1.0)
Monocytes Relative: 10.6 % (ref 3.0–12.0)
Neutro Abs: 3 10*3/uL (ref 1.4–7.7)
Neutrophils Relative %: 51.4 % (ref 43.0–77.0)
Platelets: 220 10*3/uL (ref 150.0–400.0)
RBC: 4.4 Mil/uL (ref 3.87–5.11)
RDW: 13.5 % (ref 11.5–14.6)
WBC: 5.8 10*3/uL (ref 4.5–10.5)

## 2011-11-25 LAB — BASIC METABOLIC PANEL
BUN: 17 mg/dL (ref 6–23)
CO2: 29 mEq/L (ref 19–32)
Calcium: 9.2 mg/dL (ref 8.4–10.5)
Chloride: 103 mEq/L (ref 96–112)
Creatinine, Ser: 0.6 mg/dL (ref 0.4–1.2)
GFR: 112.59 mL/min (ref >60.00)
Glucose, Bld: 108 mg/dL — ABNORMAL HIGH (ref 70–99)
Potassium: 3.2 mEq/L — ABNORMAL LOW (ref 3.5–5.1)
Sodium: 141 mEq/L (ref 135–145)

## 2011-11-25 LAB — TSH: TSH: 1.42 u[IU]/mL (ref 0.35–5.50)

## 2011-11-25 LAB — BRAIN NATRIURETIC PEPTIDE: Pro B Natriuretic peptide (BNP): 48 pg/mL (ref 0.0–100.0)

## 2011-11-25 NOTE — Patient Instructions (Signed)
We are going to check some labs today.  We are going to arrange for a stress test.  I will see you back in about 2 to 3 weeks.  Call the [facility] office at [phone] if you have any questions, problems or concerns.

## 2011-11-25 NOTE — Assessment & Plan Note (Signed)
Her rhythm has been ok by her report. She is in sinus today.

## 2011-11-25 NOTE — Assessment & Plan Note (Signed)
She presents with worsening DOE with some discomfort across the shoulders. Will arrange for stress testing. Will check labs today as well. Last EF was normal back in 2011. I will see her back to discuss the results. Suspect this is mostly due to deconditioning and obesity. Patient is agreeable to this plan and will call if any problems develop in the interim.

## 2011-11-25 NOTE — Progress Notes (Signed)
Vicki Morris Date of Birth: 02-25-1936 Medical Record #[id number]  History of Present Illness: [name] is seen today for a work in visit. She is seen for Dr. Swaziland. Her last visit with him was in November of 2011. She has a history of PAF, GERD, hypothyroidism, HTN, hyperlipidemia and past TIA. She has been maintained on chronic coumadin therapy.  She comes in today. She is complaining of shortness of breath with exertion. No actual chest pain reported. Has been going on for about a month or so. She has been under more stress over the last several months. Her husband passed away back in 07/08/23 with lung cancer. He was sick for only 3 weeks and had brain mets. She says that after that she "really let herself go". She has just started going to a gym 3 weeks ago.  She does ok walking on flat surfaces. 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 She says this makes her "so short of breath". Sometimes she will have some pain across her shoulders but no real chest pain or chest tightness per se. A couple of weeks ago, she was out pruning some birch trees and got very short of breath and nauseated. Heart rate was in the 90's but she did not feel like she was out of rhythm. She thinks her rhythm has been ok. She comes in today to see what she needs to do to help her get back on track.   Current Outpatient Prescriptions on File Prior to Visit  Medication Sig Dispense Refill  . benazepril-hydrochlorthiazide (LOTENSIN HCT) 20-25 MG per tablet Take 1 tablet by mouth daily.      Marland Kitchen diltiazem (CARDIZEM CD) 180 MG 24 hr capsule Take 180 mg by mouth daily.        Marland Kitchen levothyroxine (SYNTHROID, LEVOTHROID) 100 MCG tablet Take 100 mcg by mouth daily.        Marland Kitchen LORazepam (ATIVAN) 0.5 MG tablet Take 0.5 mg by mouth every 8 (eight) hours as needed.      Marland Kitchen omeprazole (PRILOSEC) 20 MG capsule Take 20 mg by mouth daily.        . sertraline (ZOLOFT) 50 MG tablet Take 50 mg by mouth daily.         Marland Kitchen warfarin (COUMADIN) 2 MG tablet Take 2 mg by mouth daily. As directed       . DISCONTD: rosuvastatin (CRESTOR) 40 MG tablet Take 40 mg by mouth daily.          Allergies  Allergen Reactions  . Clarithromycin   . Nabumetone   . Sulfonamide Derivatives     Past Medical History  Diagnosis Date  . PAF (paroxysmal atrial fibrillation)   . Polyp of nasal cavity   . Allergic rhinitis, cause unspecified   . GERD (gastroesophageal reflux disease)   . Hypothyroidism   . Hyperlipidemia   . History of TIAs   . Chronic anticoagulation     Past Surgical History  Procedure Date  . Nasal sinus surgery   . Total abdominal hysterectomy w/ bilateral salpingoophorectomy   . Carpal tunnel release   . US echocardiography 01/23/2010    EF 55-60%    History  Smoking status  . Never Smoker   Smokeless tobacco  . Never Used    History  Alcohol Use No    Family History  Problem Relation Age of Onset  . Heart disease Mother   . Heart disease Father  Review of Systems: The review of systems is per the HPI.  All other systems were reviewed and are negative.  Physical Exam: BP 138/88  Pulse 55  Ht 5\' 4"  (1.626 m)  Wt 183 lb (83.008 kg)  BMI 31.41 kg/m2  SpO2 97% Patient is very pleasant and in no acute distress. Skin is warm and dry. Color is normal.  HEENT is unremarkable. Normocephalic/atraumatic. PERRL. Sclera are nonicteric. Neck is supple. No masses. No JVD. Lungs are clear. Cardiac exam shows a regular rate and rhythm. Abdomen is soft. Extremities are without edema. Gait and ROM are intact. No gross neurologic deficits noted.   LABORATORY DATA: EKG today shows sinus rhythm. Nonspecific ST and T wave changes. Tracing is unchanged.    Assessment / Plan:

## 2011-11-26 ENCOUNTER — Other Ambulatory Visit: Payer: Self-pay | Admitting: *Deleted

## 2011-11-26 DIAGNOSIS — E876 Hypokalemia: Secondary | ICD-10-CM

## 2011-11-26 MED ORDER — POTASSIUM CHLORIDE ER 10 MEQ PO TBCR: 10.0000 meq | EXTENDED_RELEASE_TABLET | ORAL | Status: DC

## 2011-11-27 ENCOUNTER — Ambulatory Visit (INDEPENDENT_AMBULATORY_CARE_PROVIDER_SITE_OTHER): Payer: Medicare Other

## 2011-11-27 DIAGNOSIS — J309 Allergic rhinitis, unspecified: Secondary | ICD-10-CM

## 2011-12-04 ENCOUNTER — Ambulatory Visit (INDEPENDENT_AMBULATORY_CARE_PROVIDER_SITE_OTHER): Payer: Medicare Other

## 2011-12-04 DIAGNOSIS — J309 Allergic rhinitis, unspecified: Secondary | ICD-10-CM

## 2011-12-04 DIAGNOSIS — IMO0001 Reserved for inherently not codable concepts without codable children: Secondary | ICD-10-CM

## 2011-12-04 HISTORY — DX: Reserved for inherently not codable concepts without codable children: IMO0001

## 2011-12-07 ENCOUNTER — Ambulatory Visit (HOSPITAL_COMMUNITY): Payer: Medicare Other | Attending: Nurse Practitioner | Admitting: Radiology

## 2011-12-07 VITALS — BP 110/80 | Ht 64.0 in | Wt 180.0 lb

## 2011-12-07 DIAGNOSIS — R0602 Shortness of breath: Secondary | ICD-10-CM

## 2011-12-07 DIAGNOSIS — R0609 Other forms of dyspnea: Secondary | ICD-10-CM | POA: Insufficient documentation

## 2011-12-07 DIAGNOSIS — R0989 Other specified symptoms and signs involving the circulatory and respiratory systems: Secondary | ICD-10-CM | POA: Insufficient documentation

## 2011-12-07 DIAGNOSIS — E785 Hyperlipidemia, unspecified: Secondary | ICD-10-CM | POA: Insufficient documentation

## 2011-12-07 DIAGNOSIS — R079 Chest pain, unspecified: Secondary | ICD-10-CM | POA: Insufficient documentation

## 2011-12-07 DIAGNOSIS — R110 Nausea: Secondary | ICD-10-CM | POA: Insufficient documentation

## 2011-12-07 DIAGNOSIS — R42 Dizziness and giddiness: Secondary | ICD-10-CM | POA: Insufficient documentation

## 2011-12-07 DIAGNOSIS — R5381 Other malaise: Secondary | ICD-10-CM | POA: Insufficient documentation

## 2011-12-07 DIAGNOSIS — R61 Generalized hyperhidrosis: Secondary | ICD-10-CM | POA: Insufficient documentation

## 2011-12-07 DIAGNOSIS — R0600 Dyspnea, unspecified: Secondary | ICD-10-CM

## 2011-12-07 DIAGNOSIS — R002 Palpitations: Secondary | ICD-10-CM | POA: Insufficient documentation

## 2011-12-07 DIAGNOSIS — Z8673 Personal history of transient ischemic attack (TIA), and cerebral infarction without residual deficits: Secondary | ICD-10-CM | POA: Insufficient documentation

## 2011-12-07 DIAGNOSIS — I10 Essential (primary) hypertension: Secondary | ICD-10-CM | POA: Insufficient documentation

## 2011-12-07 DIAGNOSIS — Z8249 Family history of ischemic heart disease and other diseases of the circulatory system: Secondary | ICD-10-CM | POA: Insufficient documentation

## 2011-12-07 MED ORDER — TECHNETIUM TC 99M TETROFOSMIN IV KIT
10.0000 | PACK | Freq: Once | INTRAVENOUS | Status: AC | PRN
  Administered 2011-12-07: 10 via INTRAVENOUS

## 2011-12-07 MED ORDER — TECHNETIUM TC 99M TETROFOSMIN IV KIT
30.0000 | PACK | Freq: Once | INTRAVENOUS | Status: AC | PRN
  Administered 2011-12-07: 30 via INTRAVENOUS

## 2011-12-07 MED ORDER — REGADENOSON 0.4 MG/5ML IV SOLN
0.4000 mg | Freq: Once | INTRAVENOUS | Status: AC
  Administered 2011-12-07: 0.4 mg via INTRAVENOUS

## 2011-12-07 NOTE — Progress Notes (Signed)
[facility] SITE 3 NUCLEAR MED [address] [place] [address] [phone]  Cardiology Nuclear Med Study  Vicki Morris is a 76 y.o. female [id number] [birth year]   Nuclear Med Background Indication for Stress Test:  Evaluation for Ischemia History:  '00 Myocardial Perfusion Study-(-) ischemia, EF=56%, 4/11 Echo: EF=55-60% Cardiac Risk Factors: Family History - CAD, Hypertension, Lipids and TIA  Symptoms: Chest Pain with/without exertion (last occurrence 3 days ago),  Diaphoresis, Dizziness, DOE, Fatigue with Exertion, Nausea and Palpitations   Nuclear Pre-Procedure Caffeine/Decaff Intake:  None NPO After: 8:00pm   Lungs:  Clear IV 0.9% NS with Angio Cath:  20g  IV Site: R Antecubital  IV Started by:  Stanton Kidney, EMT-P  Chest Size (in):  38 Cup Size: D  Height: 5\' 4"  (1.626 m)  Weight:  180 lb (81.647 kg)  BMI:  Body mass index is 30.90 kg/(m^2). Tech Comments:  NA    Nuclear Med Study 1 or 2 day study: 1 day  Stress Test Type:  Treadmill/Lexiscan  Reading MD: Dietrich Pates, MD  Order Authorizing Provider:  Peter Swaziland, MD  Resting Radionuclide: Technetium 74m Tetrofosmin  Resting Radionuclide Dose: 11.0 mCi   Stress Radionuclide:  Technetium 49m Tetrofosmin  Stress Radionuclide Dose: 33.0 mCi           Stress Protocol Rest HR: 53 Stress HR: 115  Rest BP: 110/80 Stress BP: 152/61  Exercise Time (min): 2:00 METS: n/a   Predicted Max HR: 147 bpm % Max HR: 78.23 bpm Rate Pressure Product: 46962   Dose of Adenosine (mg):  n/a Dose of Lexiscan: 0.4 mg  Dose of Atropine (mg): n/a Dose of Dobutamine: n/a mcg/kg/min (at max HR)  Stress Test Technologist: Irean Hong, RN  Nuclear Technologist:  Doyne Keel, CNMT     Rest Procedure:  Myocardial perfusion imaging was performed at rest 45 minutes following the intravenous administration of Technetium 109m Tetrofosmin. Rest ECG: Sinus Bradycardia, nonspecific  T wave changes  Stress Procedure:  The  patient received IV Lexiscan 0.4 mg over 15-seconds with concurrent low level exercise. Technetium 57m Tetrofosmin was injected at 30-seconds while the patient continued walking. There were nonspecific ST-T changes with Lexiscan. There was a rare PVC. Quantitative spect images were obtained after a 45-minute delay. Stress ECG: No significant change from baseline ECG  QPS Raw Data Images:  Images were motion corrected.  Soft tissue (diaphragm, bowel activity) underlies heart. Stress Images:  Apical defect.  Otherwise normal perfusion. Rest Images:  Incomplete improvement in the apex. Subtraction (SDS):  No evidence of ischemia. Transient Ischemic Dilatation (Normal <1.22):  1.00 Lung/Heart Ratio (Normal <0.45):  0.32  Quantitative Gated Spect Images QGS EDV:  85 ml QGS ESV:  31 ml QGS cine images:  NL LV Function; NL Wall Motion QGS EF: 64%  Impression Exercise Capacity:  Lexiscan with low level exercise. BP Response:  Normal blood pressure response. Clinical Symptoms:  No chest pain. ECG Impression:  No significant ST segment change suggestive of ischemia. Comparison with Prior Nuclear Study: No change from no reported ischemia.  Overall Impression:  Clinically and electrically negative for ischemia.  Myoview scan with apical thinning, cannot completely exclude small subendocardial scar.  No ischemia.  LVEF normal at 64%.

## 2011-12-10 ENCOUNTER — Ambulatory Visit (INDEPENDENT_AMBULATORY_CARE_PROVIDER_SITE_OTHER): Payer: Medicare Other

## 2011-12-10 ENCOUNTER — Encounter: Payer: Self-pay | Admitting: Nurse Practitioner

## 2011-12-10 ENCOUNTER — Ambulatory Visit (INDEPENDENT_AMBULATORY_CARE_PROVIDER_SITE_OTHER): Payer: Medicare Other | Admitting: Nurse Practitioner

## 2011-12-10 ENCOUNTER — Other Ambulatory Visit: Payer: Medicare Other

## 2011-12-10 VITALS — BP 142/82 | HR 68 | Ht 64.0 in | Wt 180.0 lb

## 2011-12-10 DIAGNOSIS — R0602 Shortness of breath: Secondary | ICD-10-CM

## 2011-12-10 DIAGNOSIS — J309 Allergic rhinitis, unspecified: Secondary | ICD-10-CM

## 2011-12-10 DIAGNOSIS — I4891 Unspecified atrial fibrillation: Secondary | ICD-10-CM

## 2011-12-10 DIAGNOSIS — E876 Hypokalemia: Secondary | ICD-10-CM

## 2011-12-10 LAB — BASIC METABOLIC PANEL
BUN: 18 mg/dL (ref 6–23)
CO2: 28 mEq/L (ref 19–32)
Calcium: 9.2 mg/dL (ref 8.4–10.5)
Chloride: 104 mEq/L (ref 96–112)
Creatinine, Ser: 0.7 mg/dL (ref 0.4–1.2)
GFR: 94.79 mL/min (ref >60.00)
Glucose, Bld: 111 mg/dL — ABNORMAL HIGH (ref 70–99)
Potassium: 3.3 mEq/L — ABNORMAL LOW (ref 3.5–5.1)
Sodium: 140 mEq/L (ref 135–145)

## 2011-12-10 MED ORDER — DILTIAZEM HCL ER COATED BEADS 240 MG PO CP24: 240.0000 mg | ORAL_CAPSULE | Freq: Every day | ORAL | Status: AC

## 2011-12-10 NOTE — Patient Instructions (Signed)
We are going to increase your Diltiazem to 240 mg daily. This should help with your skips and your blood pressure.  Walking every day is encouraged.  We are rechecking your potassium level today.  Dr. Swaziland and I will see you back in 6 weeks.  Call the [facility] office at [phone] if you have any questions, problems or concerns.

## 2011-12-10 NOTE — Assessment & Plan Note (Signed)
In sinus by physical exam today. Now she is not sure if she is having some PAF. Does note some skips. Will increase her Diltiazem to 240 mg. Will see her back in 6 weeks.

## 2011-12-10 NOTE — Progress Notes (Signed)
Vicki Morris Date of Birth: 1935-12-08 Medical Record #[id number]  History of Present Illness: Vicki Morris is seen back today for a follow up visit. She is seen for Dr. Swaziland. She has a history of PAF, GERD, hypothyroidism, HTN, hyperlipidemia and past TIA. She is maintained on chronic coumadin. I saw her a couple of weeks ago with DOE. No actual chest pain but was having some pain across her shoulders. She was referred for repeat stress testing. She has been under a lot of stress with her husband's death and "letting helself go". She denied any recurrent arrhythmia. We suspected that most of her symptoms were more related to obesity and deconditioning. Potassium was low on her labs and replacement was started. She is due for repeat labs today.   She comes in today. Her stress test is reviewed with her. This was felt to be clinically and electrically negative for ischemia. There was apical thinning, could not exclude small subendocardial scar but no ischemia. EF was normal at 64%. She is feeling better. Still will get a little short of breath with going up inclines. No chest pain. Now notes that she has some palpitations. Not sure if she is having atrial fib now or not. She is still grieving the loss of her husband. She has had quite a struggle for the past 3 years and admits that she had not been caring for herself. She does feel better with the addition of the potassium. She said she previously had a feeling as if "something were off in her body". This is now resolved.   Current Outpatient Prescriptions on File Prior to Visit  Medication Sig Dispense Refill  . benazepril-hydrochlorthiazide (LOTENSIN HCT) 20-25 MG per tablet Take 1 tablet by mouth daily.      Marland Kitchen levothyroxine (SYNTHROID, LEVOTHROID) 100 MCG tablet Take 100 mcg by mouth daily.        Marland Kitchen LORazepam (ATIVAN) 0.5 MG tablet Take 0.5 mg by mouth every 8 (eight) hours as needed.      Marland Kitchen omeprazole (PRILOSEC) 20 MG capsule Take 20 mg by  mouth daily.        . potassium chloride (K-DUR) 10 MEQ tablet Take 1 tablet (10 mEq total) by mouth 1 day or 1 dose.  30 tablet  5  . rosuvastatin (CRESTOR) 10 MG tablet Take 10 mg by mouth daily.      . sertraline (ZOLOFT) 50 MG tablet Take 50 mg by mouth daily.        Marland Kitchen warfarin (COUMADIN) 2 MG tablet Take 2 mg by mouth daily. As directed         Allergies  Allergen Reactions  . Clarithromycin   . Nabumetone   . Sulfonamide Derivatives     Past Medical History  Diagnosis Date  . PAF (paroxysmal atrial fibrillation)   . Polyp of nasal cavity   . Allergic rhinitis, cause unspecified   . GERD (gastroesophageal reflux disease)   . Hypothyroidism   . Hyperlipidemia   . History of TIAs   . Chronic anticoagulation   . Obesity   . Normal nuclear stress test March 2013    EF 64%, clinically and electrically negative for ischemia. Myoview scan with apical thinning, could not exclude small subendocardial scar. No ischemia.     Past Surgical History  Procedure Date  . Nasal sinus surgery   . Total abdominal hysterectomy w/ bilateral salpingoophorectomy   . Carpal tunnel release   . US echocardiography 01/23/2010  EF 55-60%    History  Smoking status  . Never Smoker   Smokeless tobacco  . Never Used    History  Alcohol Use No    Family History  Problem Relation Age of Onset  . Heart disease Mother   . Heart disease Father   . Heart attack Father     Review of Systems: The review of systems is per the HPI.  She sleeps poorly. Uses Ativan sparingly. On low dose Zoloft. No chest pain. All other systems were reviewed and are negative.  Physical Exam: BP 142/82  Pulse 68  Ht 5\' 4"  (1.626 m)  Wt 180 lb (81.647 kg)  BMI 30.90 kg/m2 Patient is very pleasant and in no acute distress. Skin is warm and dry. Color is normal.  HEENT is unremarkable. Normocephalic/atraumatic. PERRL. Sclera are nonicteric. Neck is supple. No masses. No JVD. Lungs are clear. Cardiac exam shows  a regular rate and rhythm. Abdomen is soft. Extremities are without edema. Gait and ROM are intact. No gross neurologic deficits noted.   LABORATORY DATA:   Assessment / Plan:

## 2011-12-10 NOTE — Assessment & Plan Note (Signed)
Her stress test looks ok. I will make sure that Dr. Swaziland has reviewed this as well. Could not exclude small subendocardial scar but there was no ischemia. I really feel her DOE is more related to obesity and deconditioning. She is going to get back on track with her diet and exercise program. Dr. Swaziland and I will see her back in 6 weeks. Patient is agreeable to this plan and will call if any problems develop in the interim.

## 2011-12-16 ENCOUNTER — Telehealth: Payer: Self-pay | Admitting: Cardiology

## 2011-12-16 DIAGNOSIS — I4891 Unspecified atrial fibrillation: Secondary | ICD-10-CM

## 2011-12-16 NOTE — Telephone Encounter (Signed)
Patient called stated she has increased her potassium to 20 meq daily like advised on 12/11/11.Repeat bmet 12/28/11.

## 2011-12-16 NOTE — Telephone Encounter (Signed)
Pt was called to make an appt in 2 weeks for lab work to check potassium, she's coming 3-25, we need order

## 2011-12-18 ENCOUNTER — Ambulatory Visit (INDEPENDENT_AMBULATORY_CARE_PROVIDER_SITE_OTHER): Payer: Medicare Other

## 2011-12-18 DIAGNOSIS — J309 Allergic rhinitis, unspecified: Secondary | ICD-10-CM

## 2011-12-25 ENCOUNTER — Ambulatory Visit (INDEPENDENT_AMBULATORY_CARE_PROVIDER_SITE_OTHER): Payer: Medicare Other

## 2011-12-25 DIAGNOSIS — J309 Allergic rhinitis, unspecified: Secondary | ICD-10-CM

## 2011-12-28 ENCOUNTER — Other Ambulatory Visit (INDEPENDENT_AMBULATORY_CARE_PROVIDER_SITE_OTHER): Payer: Medicare Other

## 2011-12-28 DIAGNOSIS — I4891 Unspecified atrial fibrillation: Secondary | ICD-10-CM

## 2011-12-28 LAB — BASIC METABOLIC PANEL
CO2: 30 mEq/L (ref 19–32)
Chloride: 106 mEq/L (ref 96–112)
Glucose, Bld: 111 mg/dL — ABNORMAL HIGH (ref 70–99)
Sodium: 141 mEq/L (ref 135–145)

## 2011-12-31 ENCOUNTER — Ambulatory Visit (INDEPENDENT_AMBULATORY_CARE_PROVIDER_SITE_OTHER): Payer: Medicare Other

## 2011-12-31 DIAGNOSIS — J309 Allergic rhinitis, unspecified: Secondary | ICD-10-CM

## 2012-01-08 ENCOUNTER — Ambulatory Visit (INDEPENDENT_AMBULATORY_CARE_PROVIDER_SITE_OTHER): Payer: Medicare Other

## 2012-01-08 DIAGNOSIS — J309 Allergic rhinitis, unspecified: Secondary | ICD-10-CM

## 2012-01-12 ENCOUNTER — Other Ambulatory Visit: Payer: Self-pay

## 2012-01-12 DIAGNOSIS — E876 Hypokalemia: Secondary | ICD-10-CM

## 2012-01-13 ENCOUNTER — Telehealth: Payer: Self-pay | Admitting: Cardiology

## 2012-01-13 DIAGNOSIS — E876 Hypokalemia: Secondary | ICD-10-CM

## 2012-01-13 MED ORDER — POTASSIUM CHLORIDE ER 10 MEQ PO TBCR: 10.0000 meq | EXTENDED_RELEASE_TABLET | Freq: Two times a day (BID) | ORAL | Status: DC

## 2012-01-13 NOTE — Telephone Encounter (Signed)
Pt taking potassium one a day, some days takes two so ran out early and needs new rx called to  [facility]- with a larger quantity

## 2012-01-15 ENCOUNTER — Ambulatory Visit (INDEPENDENT_AMBULATORY_CARE_PROVIDER_SITE_OTHER): Payer: Medicare Other

## 2012-01-15 DIAGNOSIS — J309 Allergic rhinitis, unspecified: Secondary | ICD-10-CM

## 2012-01-22 ENCOUNTER — Ambulatory Visit (INDEPENDENT_AMBULATORY_CARE_PROVIDER_SITE_OTHER): Payer: Medicare Other

## 2012-01-22 DIAGNOSIS — J309 Allergic rhinitis, unspecified: Secondary | ICD-10-CM

## 2012-01-25 ENCOUNTER — Encounter: Payer: Self-pay | Admitting: Nurse Practitioner

## 2012-01-25 ENCOUNTER — Ambulatory Visit (INDEPENDENT_AMBULATORY_CARE_PROVIDER_SITE_OTHER): Payer: Medicare Other | Admitting: Nurse Practitioner

## 2012-01-25 VITALS — BP 148/88 | HR 56 | Ht 64.0 in | Wt 180.0 lb

## 2012-01-25 DIAGNOSIS — I4891 Unspecified atrial fibrillation: Secondary | ICD-10-CM

## 2012-01-25 DIAGNOSIS — R0602 Shortness of breath: Secondary | ICD-10-CM

## 2012-01-25 MED ORDER — NITROGLYCERIN 0.4 MG SL SUBL: 0.4000 mg | SUBLINGUAL_TABLET | SUBLINGUAL | Status: DC | PRN

## 2012-01-25 NOTE — Patient Instructions (Signed)
Keep up the walking.  I have sent a prescription for NTG to the drug store. Use your NTG under your tongue for recurrent chest pain. May take one tablet every 5 minutes. If you are still having discomfort after 3 tablets in 15 minutes, call 911.  We will see you in 3 months.  Call the [facility] office at [phone] if you have any questions, problems or concerns.

## 2012-01-25 NOTE — Progress Notes (Signed)
Vicki Morris Date of Birth: 08-26-1936 Medical Record #[id number]  History of Present Illness: Vicki Morris is seen back today for her 6 week check. She is seen for Dr. Swaziland. She has PAF, GERD, hypothyroidism, HTN, HLD and a past TIA. She is on chronic coumadin. She has had recent stress testing for DOE showing a normal EF at 64%, apical thinning, a small subendocardial scar could not be excluded but there was no ischemia. We did increase her Diltiazem for possible PAF. She was felt to be deconditioned and is still grieving the loss of her husband and had really not been taking good care of herself for the last several years.   She comes in today. She is here alone. She is now walking 45 minutes a day. Feels good with her walking. Breathing has improved. No chest pain with exertion. Did have some chest discomfort last week that she attributed to over doing it in the garden. No NTG use and does not have a new RX. She says she is feeling better over all. Blood pressure at home has been doing well. Dr. Juleen China manages her coumadin and she is due to have her lipids checked next month with her physical.  Current Outpatient Prescriptions on File Prior to Visit  Medication Sig Dispense Refill  . benazepril-hydrochlorthiazide (LOTENSIN HCT) 20-25 MG per tablet Take 1 tablet by mouth daily.      Marland Kitchen diltiazem (CARTIA XT) 240 MG 24 hr capsule Take 1 capsule (240 mg total) by mouth daily.  90 capsule  3  . levothyroxine (SYNTHROID, LEVOTHROID) 100 MCG tablet Take 100 mcg by mouth daily.        Marland Kitchen LORazepam (ATIVAN) 0.5 MG tablet Take 0.5 mg by mouth every 8 (eight) hours as needed.      Marland Kitchen omeprazole (PRILOSEC) 20 MG capsule Take 20 mg by mouth daily.        . potassium chloride (K-DUR) 10 MEQ tablet Take 1 tablet (10 mEq total) by mouth 2 (two) times daily.  60 tablet  1  . rosuvastatin (CRESTOR) 10 MG tablet Take 10 mg by mouth daily.      . sertraline (ZOLOFT) 50 MG tablet Take 50 mg by mouth daily.          Marland Kitchen warfarin (COUMADIN) 2 MG tablet Take 2 mg by mouth daily. As directed       . nitroGLYCERIN (NITROSTAT) 0.4 MG SL tablet Place 1 tablet (0.4 mg total) under the tongue every 5 (five) minutes as needed for chest pain.  25 tablet  6    Allergies  Allergen Reactions  . Clarithromycin   . Nabumetone   . Sulfonamide Derivatives     Past Medical History  Diagnosis Date  . PAF (paroxysmal atrial fibrillation)   . Polyp of nasal cavity   . Allergic rhinitis, cause unspecified   . GERD (gastroesophageal reflux disease)   . Hypothyroidism   . Hyperlipidemia   . History of TIAs   . Chronic anticoagulation   . Obesity   . Normal nuclear stress test March 2013    EF 64%, clinically and electrically negative for ischemia. Myoview scan with apical thinning, could not exclude small subendocardial scar. No ischemia.     Past Surgical History  Procedure Date  . Nasal sinus surgery   . Total abdominal hysterectomy w/ bilateral salpingoophorectomy   . Carpal tunnel release   . US echocardiography 01/23/2010    EF 55-60%    History  Smoking  status  . Never Smoker   Smokeless tobacco  . Never Used    History  Alcohol Use No    Family History  Problem Relation Age of Onset  . Heart disease Mother   . Heart disease Father   . Heart attack Father     Review of Systems: The review of systems is per the HPI.  All other systems were reviewed and are negative.  Physical Exam: BP 148/88  Pulse 56  Ht 5\' 4"  (1.626 m)  Wt 180 lb (81.647 kg)  BMI 30.90 kg/m2 Patient is very pleasant and in no acute distress. Skin is warm and dry. Color is normal.  HEENT is unremarkable. Normocephalic/atraumatic. PERRL. Sclera are nonicteric. Neck is supple. No masses. No JVD. Lungs are clear. Cardiac exam shows a regular rate and rhythm. Abdomen is soft. Extremities are without edema. Gait and ROM are intact. No gross neurologic deficits noted.   Lab Results  Component Value Date   WBC 5.8  11/25/2011   HGB 13.2 11/25/2011   HCT 38.7 11/25/2011   PLT 220.0 11/25/2011   GLUCOSE 111* 12/28/2011   NA 141 12/28/2011   K 3.7 12/28/2011   CL 106 12/28/2011   CREATININE 0.6 12/28/2011   BUN 12 12/28/2011   CO2 30 12/28/2011   TSH 1.42 11/25/2011   INR 2.0* 04/27/2009    Assessment / Plan:

## 2012-01-25 NOTE — Assessment & Plan Note (Signed)
Improved with exercise program. Still with some atypical chest pain but no exertional symptoms. I have refilled her NTG. We will tentatively see her back in 3 months, but sooner if she has issues. Patient is agreeable to this plan and will call if any problems develop in the interim.

## 2012-01-25 NOTE — Assessment & Plan Note (Signed)
She feels like her rhythm has been ok. In sinus on physical exam today. Remains on chronic coumadin therapy.

## 2012-01-28 ENCOUNTER — Ambulatory Visit (INDEPENDENT_AMBULATORY_CARE_PROVIDER_SITE_OTHER): Payer: Medicare Other

## 2012-01-28 DIAGNOSIS — J309 Allergic rhinitis, unspecified: Secondary | ICD-10-CM

## 2012-02-04 ENCOUNTER — Ambulatory Visit (INDEPENDENT_AMBULATORY_CARE_PROVIDER_SITE_OTHER): Payer: Medicare Other

## 2012-02-04 DIAGNOSIS — J309 Allergic rhinitis, unspecified: Secondary | ICD-10-CM

## 2012-02-12 ENCOUNTER — Ambulatory Visit (INDEPENDENT_AMBULATORY_CARE_PROVIDER_SITE_OTHER): Payer: Medicare Other

## 2012-02-12 DIAGNOSIS — J309 Allergic rhinitis, unspecified: Secondary | ICD-10-CM

## 2012-02-15 ENCOUNTER — Other Ambulatory Visit: Payer: Self-pay | Admitting: Gynecology

## 2012-02-16 ENCOUNTER — Encounter: Payer: Self-pay | Admitting: Internal Medicine

## 2012-02-23 ENCOUNTER — Encounter: Payer: Self-pay | Admitting: Cardiology

## 2012-02-26 ENCOUNTER — Ambulatory Visit (INDEPENDENT_AMBULATORY_CARE_PROVIDER_SITE_OTHER): Payer: Medicare Other

## 2012-02-26 DIAGNOSIS — J309 Allergic rhinitis, unspecified: Secondary | ICD-10-CM

## 2012-03-04 ENCOUNTER — Ambulatory Visit (INDEPENDENT_AMBULATORY_CARE_PROVIDER_SITE_OTHER): Payer: Medicare Other

## 2012-03-04 DIAGNOSIS — J309 Allergic rhinitis, unspecified: Secondary | ICD-10-CM

## 2012-03-11 ENCOUNTER — Ambulatory Visit (INDEPENDENT_AMBULATORY_CARE_PROVIDER_SITE_OTHER): Payer: Medicare Other

## 2012-03-11 DIAGNOSIS — J309 Allergic rhinitis, unspecified: Secondary | ICD-10-CM

## 2012-03-18 ENCOUNTER — Ambulatory Visit (INDEPENDENT_AMBULATORY_CARE_PROVIDER_SITE_OTHER): Payer: Medicare Other

## 2012-03-18 DIAGNOSIS — J309 Allergic rhinitis, unspecified: Secondary | ICD-10-CM

## 2012-03-24 ENCOUNTER — Ambulatory Visit (INDEPENDENT_AMBULATORY_CARE_PROVIDER_SITE_OTHER): Payer: Medicare Other

## 2012-03-24 DIAGNOSIS — J309 Allergic rhinitis, unspecified: Secondary | ICD-10-CM

## 2012-04-01 ENCOUNTER — Ambulatory Visit (INDEPENDENT_AMBULATORY_CARE_PROVIDER_SITE_OTHER): Payer: Medicare Other

## 2012-04-01 DIAGNOSIS — J309 Allergic rhinitis, unspecified: Secondary | ICD-10-CM

## 2012-04-12 ENCOUNTER — Ambulatory Visit: Payer: Medicare Other | Admitting: Internal Medicine

## 2012-04-15 ENCOUNTER — Ambulatory Visit (INDEPENDENT_AMBULATORY_CARE_PROVIDER_SITE_OTHER): Payer: Medicare Other

## 2012-04-15 DIAGNOSIS — J309 Allergic rhinitis, unspecified: Secondary | ICD-10-CM

## 2012-04-21 ENCOUNTER — Ambulatory Visit (INDEPENDENT_AMBULATORY_CARE_PROVIDER_SITE_OTHER): Payer: Medicare Other

## 2012-04-21 DIAGNOSIS — J309 Allergic rhinitis, unspecified: Secondary | ICD-10-CM

## 2012-04-29 ENCOUNTER — Ambulatory Visit (INDEPENDENT_AMBULATORY_CARE_PROVIDER_SITE_OTHER): Payer: Medicare Other | Admitting: Cardiology

## 2012-04-29 ENCOUNTER — Ambulatory Visit (INDEPENDENT_AMBULATORY_CARE_PROVIDER_SITE_OTHER): Payer: Medicare Other

## 2012-04-29 ENCOUNTER — Encounter: Payer: Self-pay | Admitting: Cardiology

## 2012-04-29 VITALS — BP 124/76 | HR 57 | Ht 64.0 in | Wt 182.0 lb

## 2012-04-29 DIAGNOSIS — J309 Allergic rhinitis, unspecified: Secondary | ICD-10-CM

## 2012-04-29 DIAGNOSIS — E876 Hypokalemia: Secondary | ICD-10-CM

## 2012-04-29 DIAGNOSIS — R0789 Other chest pain: Secondary | ICD-10-CM

## 2012-04-29 DIAGNOSIS — R0602 Shortness of breath: Secondary | ICD-10-CM

## 2012-04-29 DIAGNOSIS — I4891 Unspecified atrial fibrillation: Secondary | ICD-10-CM

## 2012-04-29 MED ORDER — POTASSIUM CHLORIDE CRYS ER 20 MEQ PO TBCR: 20.0000 meq | EXTENDED_RELEASE_TABLET | Freq: Every day | ORAL | Status: DC

## 2012-04-29 NOTE — Patient Instructions (Signed)
Continue potassium 20 meq daily  Continue your other medications  I will see you back in 6 months.

## 2012-05-01 DIAGNOSIS — E876 Hypokalemia: Secondary | ICD-10-CM | POA: Insufficient documentation

## 2012-05-01 NOTE — Progress Notes (Signed)
Vicki Morris Date of Birth: 1936/04/05 Medical Record #[id number]  History of Present Illness: [name] is seen for a followup visit. She has PAF, GERD, hypothyroidism, HTN, HLD and a past TIA. She is on chronic coumadin. She has had recent stress testing for DOE showing a normal EF at 64%, apical thinning, a small subendocardial scar could not be excluded but there was no ischemia. On followup today she reports that she is feeling well. She admits that she eats a lot of junk. She has been walking regularly. She had one episode 6 weeks ago where she felt her heart racing in her heart rate went up to 120 beats per minute. She has had no episodes since then. She ran out of her potassium tablets 2 weeks ago.  Current Outpatient Prescriptions on File Prior to Visit  Medication Sig Dispense Refill  . benazepril-hydrochlorthiazide (LOTENSIN HCT) 20-25 MG per tablet Take 1 tablet by mouth daily.      Marland Kitchen diltiazem (CARTIA XT) 240 MG 24 hr capsule Take 1 capsule (240 mg total) by mouth daily.  90 capsule  3  . levothyroxine (SYNTHROID, LEVOTHROID) 100 MCG tablet Take 100 mcg by mouth daily.        Marland Kitchen LORazepam (ATIVAN) 0.5 MG tablet Take 0.5 mg by mouth every 8 (eight) hours as needed.      . nitroGLYCERIN (NITROSTAT) 0.4 MG SL tablet Place 1 tablet (0.4 mg total) under the tongue every 5 (five) minutes as needed for chest pain.  25 tablet  6  . omeprazole (PRILOSEC) 20 MG capsule Take 20 mg by mouth daily.        . potassium chloride (K-DUR) 10 MEQ tablet Take 1 tablet (10 mEq total) by mouth 2 (two) times daily.  60 tablet  1  . rosuvastatin (CRESTOR) 10 MG tablet Take 10 mg by mouth daily.      . sertraline (ZOLOFT) 50 MG tablet Take 50 mg by mouth daily.        Marland Kitchen warfarin (COUMADIN) 2 MG tablet Take 2 mg by mouth daily. As directed       . potassium chloride SA (K-DUR,KLOR-CON) 20 MEQ tablet Take 1 tablet (20 mEq total) by mouth daily.  90 tablet  3    Allergies  Allergen Reactions  .  Clarithromycin   . Nabumetone   . Sulfonamide Derivatives     Past Medical History  Diagnosis Date  . PAF (paroxysmal atrial fibrillation)   . Polyp of nasal cavity   . Allergic rhinitis, cause unspecified   . GERD (gastroesophageal reflux disease)   . Hypothyroidism   . Hyperlipidemia   . History of TIAs   . Chronic anticoagulation   . Obesity   . Normal nuclear stress test March 2013    EF 64%, clinically and electrically negative for ischemia. Myoview scan with apical thinning, could not exclude small subendocardial scar. No ischemia.     Past Surgical History  Procedure Date  . Nasal sinus surgery   . Total abdominal hysterectomy w/ bilateral salpingoophorectomy   . Carpal tunnel release   . US echocardiography 01/23/2010    EF 55-60%    History  Smoking status  . Never Smoker   Smokeless tobacco  . Never Used    History  Alcohol Use No    Family History  Problem Relation Age of Onset  . Heart disease Mother   . Heart disease Father   . Heart attack Father  Review of Systems: The review of systems is per the HPI.  All other systems were reviewed and are negative.  Physical Exam: BP 124/76  Pulse 57  Ht 5\' 4"  (1.626 m)  Wt 182 lb (82.555 kg)  BMI 31.24 kg/m2  SpO2 98% Patient is very pleasant and in no acute distress. Skin is warm and dry. Color is normal.  HEENT is unremarkable. Normocephalic/atraumatic. PERRL. Sclera are nonicteric. Neck is supple. No masses. No JVD. Lungs are clear. Cardiac exam shows a regular rate and rhythm. Abdomen is soft. Extremities are without edema. Gait and ROM are intact. No gross neurologic deficits noted.    Assessment / Plan:

## 2012-05-01 NOTE — Assessment & Plan Note (Signed)
Prior stress testing was unremarkable. Have encouraged continue with her exercise program and try and lose weight.

## 2012-05-01 NOTE — Assessment & Plan Note (Signed)
She has rare symptoms of paroxysmal atrial fibrillation. We will continue management with rate control with diltiazem. She will remain on Coumadin for anticoagulation. I'll followup again in 6 months.

## 2012-05-01 NOTE — Assessment & Plan Note (Signed)
Review of her records indicate that she has been severely hypokalemic in the past. I recommended she remain on potassium supplementation. We have renewed her prescription today.

## 2012-05-03 ENCOUNTER — Ambulatory Visit (INDEPENDENT_AMBULATORY_CARE_PROVIDER_SITE_OTHER)
Admission: RE | Admit: 2012-05-03 | Discharge: 2012-05-03 | Disposition: A | Discharge status: Home / Self Care | Payer: Medicare Other | Source: Ambulatory Visit | Attending: Internal Medicine | Admitting: Internal Medicine

## 2012-05-03 ENCOUNTER — Ambulatory Visit (INDEPENDENT_AMBULATORY_CARE_PROVIDER_SITE_OTHER): Payer: Medicare Other | Admitting: Internal Medicine

## 2012-05-03 ENCOUNTER — Encounter: Payer: Self-pay | Admitting: Internal Medicine

## 2012-05-03 VITALS — BP 132/78 | HR 54 | Ht 64.0 in | Wt 181.8 lb

## 2012-05-03 DIAGNOSIS — R05 Cough: Secondary | ICD-10-CM

## 2012-05-03 DIAGNOSIS — J309 Allergic rhinitis, unspecified: Secondary | ICD-10-CM

## 2012-05-03 DIAGNOSIS — J330 Polyp of nasal cavity: Secondary | ICD-10-CM

## 2012-05-03 DIAGNOSIS — K219 Gastro-esophageal reflux disease without esophagitis: Secondary | ICD-10-CM

## 2012-05-03 DIAGNOSIS — R059 Cough, unspecified: Secondary | ICD-10-CM

## 2012-05-03 DIAGNOSIS — J302 Other seasonal allergic rhinitis: Secondary | ICD-10-CM

## 2012-05-03 NOTE — Progress Notes (Signed)
  Subjective:    Patient ID: Vicki Morris, female    DOB: 1936-10-03, 76 y.o.   MRN: [id number]  HPI 76 yo F followed for allergic rhinitis and dyspnea, complicated by GERD, atrial fib.  Last here November 20, 2010. She wheezed a little last night after spreading mulch yesterday, but otherwise has done quite well considering the pollen season. She continues allergy vaccine here at 1:10. We discussed her vaccine, and meds.  05/03/12- [name] followed for allergic rhinitis and dyspnea, complicated by GERD, atrial fib.  Widowed since last here- husband had metastatic lung cancer.  Cough in throat(tickle) gets worse at night and causes wheezing, Pt  is still getting allergy vaccine 1:10 and doing well on it. Ongoing reflux symptoms despite Prilosec. On an ACE inhibitor-we discussed potential for this to be the cause of her dry cough.  ROS-see HPI Constitutional:   No-   weight loss, night sweats, fevers, chills, fatigue, lassitude. HEENT:   No-  headaches, difficulty swallowing, tooth/dental problems, sore throat,       No-  sneezing, itching, ear ache, nasal congestion, post nasal drip,  CV:  No-   chest pain, orthopnea, PND, swelling in lower extremities, anasarca, dizziness, palpitations Resp: No-   shortness of breath with exertion or at rest.              No-   productive cough,  +non-productive cough,  No- coughing up of blood.              No-   change in color of mucus.  No- wheezing.   Skin: No-   rash or lesions. GI:  No-   heartburn, indigestion, abdominal pain, nausea, vomiting,  GU: . MS:  No-   joint pain or swelling.   Neuro-     nothing unusual Psych:  No- change in mood or affect. Some grieving, otherwise no depression or anxiety.  No memory loss.  OBJ- Physical Exam General- Alert, Oriented, Affect-appropriate, Distress- none acute Skin- rash-none, lesions- none, excoriation- none Lymphadenopathy- none Head- atraumatic            Eyes- Gross vision intact, PERRLA,  conjunctivae and secretions clear            Ears- Hearing, canals-normal            Nose- Clear, no-Septal dev, mucus, polyps, erosion, perforation             Throat- Mallampati II , mucosa clear , drainage- none, tonsils- atrophic Neck- flexible , trachea midline, no stridor , thyroid nl, carotid no bruit Chest - symmetrical excursion , unlabored           Heart/CV- RRR , no murmur , no gallop  , no rub, nl s1 s2                           - JVD- none , edema- none, stasis changes- none, varices- none           Lung- clear to P&A, wheeze- none, cough- none , dullness-none, rub- none           Chest wall-  Abd-  Br/ Gen/ Rectal- Not done, not indicated Extrem- cyanosis- none, clubbing, none, atrophy- none, strength- nl Neuro- grossly intact to observation  Assessment & Plan:

## 2012-05-03 NOTE — Patient Instructions (Addendum)
Your dry cough may be from your GERD- suggest taking your prilosec twice daily before meals for a month  Suggest to Dr Juleen China that you might try changing your ACE inhibitor Lotensin, to something else, perhaps Benicar HCT, for a month to see if that is causing your cough.  We can continue your allergy vaccine.   Order CXR   Dx cough

## 2012-05-04 NOTE — Progress Notes (Signed)
Quick Note:  Pt aware of results. ______ 

## 2012-05-06 ENCOUNTER — Ambulatory Visit (INDEPENDENT_AMBULATORY_CARE_PROVIDER_SITE_OTHER): Payer: Medicare Other

## 2012-05-06 DIAGNOSIS — J309 Allergic rhinitis, unspecified: Secondary | ICD-10-CM

## 2012-05-09 NOTE — Assessment & Plan Note (Signed)
Cough may be multifactorial but I suspect ACE inhibitor. I recommend trial switch for one month to alternative such as Benicar HCT 20-12.5. We do not have samples here today. She is going to discuss this with Dr. Juleen China.

## 2012-05-09 NOTE — Assessment & Plan Note (Signed)
She denies inadequate symptom control on Prilosec. This may contribute to her cough complaint. I have asked her to discuss with Dr. Juleen China.

## 2012-05-09 NOTE — Assessment & Plan Note (Signed)
No nasal polyps seen on current exam.

## 2012-05-09 NOTE — Assessment & Plan Note (Signed)
Good control and vaccine as well tolerated. She is comfortable continuing. She doesn't recognize enough post nasal drip or related symptoms to think this is the reason for her dry cough.

## 2012-05-13 ENCOUNTER — Ambulatory Visit (INDEPENDENT_AMBULATORY_CARE_PROVIDER_SITE_OTHER): Payer: Medicare Other

## 2012-05-13 DIAGNOSIS — J309 Allergic rhinitis, unspecified: Secondary | ICD-10-CM

## 2012-05-19 ENCOUNTER — Ambulatory Visit (INDEPENDENT_AMBULATORY_CARE_PROVIDER_SITE_OTHER): Payer: Medicare Other

## 2012-05-19 DIAGNOSIS — J309 Allergic rhinitis, unspecified: Secondary | ICD-10-CM

## 2012-05-27 ENCOUNTER — Ambulatory Visit (INDEPENDENT_AMBULATORY_CARE_PROVIDER_SITE_OTHER): Payer: Medicare Other

## 2012-05-27 DIAGNOSIS — J309 Allergic rhinitis, unspecified: Secondary | ICD-10-CM

## 2012-06-02 ENCOUNTER — Ambulatory Visit (INDEPENDENT_AMBULATORY_CARE_PROVIDER_SITE_OTHER): Payer: Medicare Other

## 2012-06-02 DIAGNOSIS — J309 Allergic rhinitis, unspecified: Secondary | ICD-10-CM

## 2012-06-09 ENCOUNTER — Ambulatory Visit (INDEPENDENT_AMBULATORY_CARE_PROVIDER_SITE_OTHER): Payer: Medicare Other

## 2012-06-09 DIAGNOSIS — J309 Allergic rhinitis, unspecified: Secondary | ICD-10-CM

## 2012-06-17 ENCOUNTER — Ambulatory Visit (INDEPENDENT_AMBULATORY_CARE_PROVIDER_SITE_OTHER): Payer: Medicare Other

## 2012-06-17 DIAGNOSIS — J309 Allergic rhinitis, unspecified: Secondary | ICD-10-CM

## 2012-06-21 ENCOUNTER — Encounter: Payer: Self-pay | Admitting: Internal Medicine

## 2012-06-24 ENCOUNTER — Ambulatory Visit (INDEPENDENT_AMBULATORY_CARE_PROVIDER_SITE_OTHER): Payer: Medicare Other

## 2012-06-24 DIAGNOSIS — J309 Allergic rhinitis, unspecified: Secondary | ICD-10-CM

## 2012-07-01 ENCOUNTER — Ambulatory Visit (INDEPENDENT_AMBULATORY_CARE_PROVIDER_SITE_OTHER): Payer: Medicare Other

## 2012-07-01 DIAGNOSIS — J309 Allergic rhinitis, unspecified: Secondary | ICD-10-CM

## 2012-07-08 ENCOUNTER — Ambulatory Visit (INDEPENDENT_AMBULATORY_CARE_PROVIDER_SITE_OTHER): Payer: Medicare Other

## 2012-07-08 DIAGNOSIS — J309 Allergic rhinitis, unspecified: Secondary | ICD-10-CM

## 2012-07-15 ENCOUNTER — Ambulatory Visit (INDEPENDENT_AMBULATORY_CARE_PROVIDER_SITE_OTHER): Payer: Medicare Other

## 2012-07-15 DIAGNOSIS — J309 Allergic rhinitis, unspecified: Secondary | ICD-10-CM

## 2012-07-15 DIAGNOSIS — Z23 Encounter for immunization: Secondary | ICD-10-CM

## 2012-07-18 DIAGNOSIS — Z23 Encounter for immunization: Secondary | ICD-10-CM

## 2012-07-21 ENCOUNTER — Ambulatory Visit (INDEPENDENT_AMBULATORY_CARE_PROVIDER_SITE_OTHER): Payer: Medicare Other

## 2012-07-21 DIAGNOSIS — J309 Allergic rhinitis, unspecified: Secondary | ICD-10-CM

## 2012-07-25 ENCOUNTER — Ambulatory Visit (INDEPENDENT_AMBULATORY_CARE_PROVIDER_SITE_OTHER): Payer: Medicare Other

## 2012-07-25 DIAGNOSIS — J309 Allergic rhinitis, unspecified: Secondary | ICD-10-CM

## 2012-07-29 ENCOUNTER — Ambulatory Visit (INDEPENDENT_AMBULATORY_CARE_PROVIDER_SITE_OTHER): Payer: Medicare Other

## 2012-07-29 DIAGNOSIS — J309 Allergic rhinitis, unspecified: Secondary | ICD-10-CM

## 2012-08-04 ENCOUNTER — Ambulatory Visit (INDEPENDENT_AMBULATORY_CARE_PROVIDER_SITE_OTHER): Payer: Medicare Other

## 2012-08-04 DIAGNOSIS — J309 Allergic rhinitis, unspecified: Secondary | ICD-10-CM

## 2012-08-12 ENCOUNTER — Ambulatory Visit (INDEPENDENT_AMBULATORY_CARE_PROVIDER_SITE_OTHER): Payer: Medicare Other

## 2012-08-12 DIAGNOSIS — J309 Allergic rhinitis, unspecified: Secondary | ICD-10-CM

## 2012-08-19 ENCOUNTER — Ambulatory Visit (INDEPENDENT_AMBULATORY_CARE_PROVIDER_SITE_OTHER): Payer: Medicare Other

## 2012-08-19 DIAGNOSIS — J309 Allergic rhinitis, unspecified: Secondary | ICD-10-CM

## 2012-08-26 ENCOUNTER — Ambulatory Visit (INDEPENDENT_AMBULATORY_CARE_PROVIDER_SITE_OTHER): Payer: Medicare Other

## 2012-08-26 DIAGNOSIS — J309 Allergic rhinitis, unspecified: Secondary | ICD-10-CM

## 2012-09-09 ENCOUNTER — Ambulatory Visit (INDEPENDENT_AMBULATORY_CARE_PROVIDER_SITE_OTHER): Payer: Medicare Other

## 2012-09-09 DIAGNOSIS — J309 Allergic rhinitis, unspecified: Secondary | ICD-10-CM

## 2012-09-16 ENCOUNTER — Ambulatory Visit (INDEPENDENT_AMBULATORY_CARE_PROVIDER_SITE_OTHER): Payer: Medicare Other

## 2012-09-16 DIAGNOSIS — J309 Allergic rhinitis, unspecified: Secondary | ICD-10-CM

## 2012-09-23 ENCOUNTER — Ambulatory Visit (INDEPENDENT_AMBULATORY_CARE_PROVIDER_SITE_OTHER): Payer: Medicare Other

## 2012-09-23 DIAGNOSIS — J309 Allergic rhinitis, unspecified: Secondary | ICD-10-CM

## 2012-09-27 ENCOUNTER — Encounter: Payer: Self-pay | Admitting: Internal Medicine

## 2012-10-07 ENCOUNTER — Ambulatory Visit (INDEPENDENT_AMBULATORY_CARE_PROVIDER_SITE_OTHER): Payer: Medicare Other

## 2012-10-07 DIAGNOSIS — J309 Allergic rhinitis, unspecified: Secondary | ICD-10-CM

## 2012-10-14 ENCOUNTER — Ambulatory Visit (INDEPENDENT_AMBULATORY_CARE_PROVIDER_SITE_OTHER): Payer: Medicare Other

## 2012-10-14 DIAGNOSIS — J309 Allergic rhinitis, unspecified: Secondary | ICD-10-CM

## 2012-10-21 ENCOUNTER — Ambulatory Visit (INDEPENDENT_AMBULATORY_CARE_PROVIDER_SITE_OTHER): Payer: Medicare Other

## 2012-10-21 DIAGNOSIS — J309 Allergic rhinitis, unspecified: Secondary | ICD-10-CM

## 2012-10-28 ENCOUNTER — Ambulatory Visit (INDEPENDENT_AMBULATORY_CARE_PROVIDER_SITE_OTHER): Payer: Medicare Other

## 2012-10-28 DIAGNOSIS — J309 Allergic rhinitis, unspecified: Secondary | ICD-10-CM

## 2012-11-03 ENCOUNTER — Ambulatory Visit: Payer: Medicare Other

## 2012-11-04 ENCOUNTER — Ambulatory Visit (INDEPENDENT_AMBULATORY_CARE_PROVIDER_SITE_OTHER): Payer: Medicare Other

## 2012-11-04 DIAGNOSIS — J309 Allergic rhinitis, unspecified: Secondary | ICD-10-CM

## 2012-11-11 ENCOUNTER — Ambulatory Visit (INDEPENDENT_AMBULATORY_CARE_PROVIDER_SITE_OTHER): Payer: Medicare Other

## 2012-11-11 DIAGNOSIS — J309 Allergic rhinitis, unspecified: Secondary | ICD-10-CM

## 2012-11-18 ENCOUNTER — Ambulatory Visit: Payer: Medicare Other

## 2012-11-25 ENCOUNTER — Ambulatory Visit (INDEPENDENT_AMBULATORY_CARE_PROVIDER_SITE_OTHER): Payer: Medicare Other

## 2012-11-25 DIAGNOSIS — J309 Allergic rhinitis, unspecified: Secondary | ICD-10-CM

## 2012-12-01 ENCOUNTER — Ambulatory Visit: Payer: Medicare Other

## 2012-12-02 ENCOUNTER — Ambulatory Visit (INDEPENDENT_AMBULATORY_CARE_PROVIDER_SITE_OTHER): Payer: Medicare Other

## 2012-12-02 DIAGNOSIS — J309 Allergic rhinitis, unspecified: Secondary | ICD-10-CM

## 2012-12-09 ENCOUNTER — Ambulatory Visit (INDEPENDENT_AMBULATORY_CARE_PROVIDER_SITE_OTHER): Payer: Medicare Other

## 2012-12-09 DIAGNOSIS — J309 Allergic rhinitis, unspecified: Secondary | ICD-10-CM

## 2012-12-16 ENCOUNTER — Ambulatory Visit (INDEPENDENT_AMBULATORY_CARE_PROVIDER_SITE_OTHER): Payer: Medicare Other

## 2012-12-16 DIAGNOSIS — J309 Allergic rhinitis, unspecified: Secondary | ICD-10-CM

## 2012-12-23 ENCOUNTER — Ambulatory Visit (INDEPENDENT_AMBULATORY_CARE_PROVIDER_SITE_OTHER): Payer: Medicare Other

## 2012-12-23 DIAGNOSIS — J309 Allergic rhinitis, unspecified: Secondary | ICD-10-CM

## 2012-12-30 ENCOUNTER — Ambulatory Visit (INDEPENDENT_AMBULATORY_CARE_PROVIDER_SITE_OTHER): Payer: Medicare Other

## 2012-12-30 DIAGNOSIS — J309 Allergic rhinitis, unspecified: Secondary | ICD-10-CM

## 2013-01-06 ENCOUNTER — Ambulatory Visit (INDEPENDENT_AMBULATORY_CARE_PROVIDER_SITE_OTHER): Payer: Medicare Other

## 2013-01-06 DIAGNOSIS — J309 Allergic rhinitis, unspecified: Secondary | ICD-10-CM

## 2013-01-13 ENCOUNTER — Ambulatory Visit (INDEPENDENT_AMBULATORY_CARE_PROVIDER_SITE_OTHER): Payer: Medicare Other

## 2013-01-13 DIAGNOSIS — J309 Allergic rhinitis, unspecified: Secondary | ICD-10-CM

## 2013-01-19 ENCOUNTER — Ambulatory Visit (INDEPENDENT_AMBULATORY_CARE_PROVIDER_SITE_OTHER): Payer: Medicare Other

## 2013-01-19 DIAGNOSIS — J309 Allergic rhinitis, unspecified: Secondary | ICD-10-CM

## 2013-01-26 ENCOUNTER — Ambulatory Visit: Payer: Medicare Other

## 2013-01-27 ENCOUNTER — Ambulatory Visit (INDEPENDENT_AMBULATORY_CARE_PROVIDER_SITE_OTHER): Payer: Medicare Other

## 2013-01-27 DIAGNOSIS — J309 Allergic rhinitis, unspecified: Secondary | ICD-10-CM

## 2013-02-02 ENCOUNTER — Ambulatory Visit (INDEPENDENT_AMBULATORY_CARE_PROVIDER_SITE_OTHER): Payer: Medicare Other

## 2013-02-02 DIAGNOSIS — J309 Allergic rhinitis, unspecified: Secondary | ICD-10-CM

## 2013-02-03 ENCOUNTER — Ambulatory Visit: Payer: Medicare Other

## 2013-02-09 ENCOUNTER — Ambulatory Visit (INDEPENDENT_AMBULATORY_CARE_PROVIDER_SITE_OTHER): Payer: Medicare Other

## 2013-02-09 DIAGNOSIS — J309 Allergic rhinitis, unspecified: Secondary | ICD-10-CM

## 2013-02-17 ENCOUNTER — Ambulatory Visit (INDEPENDENT_AMBULATORY_CARE_PROVIDER_SITE_OTHER): Payer: Medicare Other

## 2013-02-17 DIAGNOSIS — J309 Allergic rhinitis, unspecified: Secondary | ICD-10-CM

## 2013-02-20 ENCOUNTER — Encounter: Payer: Self-pay | Admitting: Internal Medicine

## 2013-02-23 ENCOUNTER — Ambulatory Visit (INDEPENDENT_AMBULATORY_CARE_PROVIDER_SITE_OTHER): Payer: Medicare Other

## 2013-02-23 DIAGNOSIS — J309 Allergic rhinitis, unspecified: Secondary | ICD-10-CM

## 2013-02-24 ENCOUNTER — Ambulatory Visit: Payer: Medicare Other

## 2013-03-01 ENCOUNTER — Ambulatory Visit (INDEPENDENT_AMBULATORY_CARE_PROVIDER_SITE_OTHER): Payer: Medicare Other

## 2013-03-01 DIAGNOSIS — J309 Allergic rhinitis, unspecified: Secondary | ICD-10-CM

## 2013-03-03 ENCOUNTER — Ambulatory Visit: Payer: Medicare Other

## 2013-03-09 ENCOUNTER — Ambulatory Visit (INDEPENDENT_AMBULATORY_CARE_PROVIDER_SITE_OTHER): Payer: Medicare Other

## 2013-03-09 DIAGNOSIS — J309 Allergic rhinitis, unspecified: Secondary | ICD-10-CM

## 2013-03-16 ENCOUNTER — Ambulatory Visit (INDEPENDENT_AMBULATORY_CARE_PROVIDER_SITE_OTHER): Payer: Medicare Other

## 2013-03-16 DIAGNOSIS — J309 Allergic rhinitis, unspecified: Secondary | ICD-10-CM

## 2013-03-17 ENCOUNTER — Ambulatory Visit: Payer: Medicare Other

## 2013-03-23 ENCOUNTER — Ambulatory Visit (INDEPENDENT_AMBULATORY_CARE_PROVIDER_SITE_OTHER): Payer: Medicare Other

## 2013-03-23 DIAGNOSIS — J309 Allergic rhinitis, unspecified: Secondary | ICD-10-CM

## 2013-03-31 ENCOUNTER — Ambulatory Visit (INDEPENDENT_AMBULATORY_CARE_PROVIDER_SITE_OTHER): Payer: Medicare Other

## 2013-03-31 DIAGNOSIS — J309 Allergic rhinitis, unspecified: Secondary | ICD-10-CM

## 2013-04-06 ENCOUNTER — Ambulatory Visit: Payer: Medicare Other

## 2013-04-12 ENCOUNTER — Ambulatory Visit (INDEPENDENT_AMBULATORY_CARE_PROVIDER_SITE_OTHER): Payer: Medicare Other

## 2013-04-12 DIAGNOSIS — J309 Allergic rhinitis, unspecified: Secondary | ICD-10-CM

## 2013-04-17 ENCOUNTER — Ambulatory Visit: Payer: Medicare Other

## 2013-04-20 ENCOUNTER — Ambulatory Visit (INDEPENDENT_AMBULATORY_CARE_PROVIDER_SITE_OTHER): Payer: Medicare Other

## 2013-04-20 DIAGNOSIS — J309 Allergic rhinitis, unspecified: Secondary | ICD-10-CM

## 2013-04-27 ENCOUNTER — Ambulatory Visit (INDEPENDENT_AMBULATORY_CARE_PROVIDER_SITE_OTHER): Payer: Medicare Other

## 2013-04-27 DIAGNOSIS — J309 Allergic rhinitis, unspecified: Secondary | ICD-10-CM

## 2013-05-03 ENCOUNTER — Encounter: Payer: Self-pay | Admitting: Internal Medicine

## 2013-05-03 ENCOUNTER — Ambulatory Visit (INDEPENDENT_AMBULATORY_CARE_PROVIDER_SITE_OTHER): Payer: Medicare Other | Admitting: Internal Medicine

## 2013-05-03 VITALS — BP 122/70 | HR 65 | Ht 64.0 in | Wt 189.2 lb

## 2013-05-03 DIAGNOSIS — J302 Other seasonal allergic rhinitis: Secondary | ICD-10-CM

## 2013-05-03 DIAGNOSIS — J309 Allergic rhinitis, unspecified: Secondary | ICD-10-CM

## 2013-05-03 NOTE — Progress Notes (Signed)
Subjective:    Patient ID: Vicki Morris, female    DOB: 12/24/1935, 78 y.o.   MRN: [id number]  HPI 77 yo F followed for allergic rhinitis and dyspnea, complicated by GERD, atrial fib.  Last here November 20, 2010. She wheezed a little last night after spreading mulch yesterday, but otherwise has done quite well considering the pollen season. She continues allergy vaccine here at 1:10. We discussed her vaccine, and meds.  05/03/12- 26 yo F followed for allergic rhinitis and dyspnea, complicated by GERD, atrial fib.  Widowed since last here- husband had metastatic lung cancer.  Cough in throat(tickle) gets worse at night and causes wheezing, Pt  is still getting allergy vaccine 1:10 and doing well on it. Ongoing reflux symptoms despite Prilosec. On an ACE inhibitor-we discussed potential for this to be the cause of her dry cough.  05/03/13- 43 yo F followed for allergic rhinitis and dyspnea, complicated by GERD, atrial fib FOLLOWS FOR: still on allergy vaccine 1:10 GH and doing well; Denies any flare ups at this time. Mild dry cough persists. CXR Neg last year. She and her PCP office realize this is likely from benazepril, but she doesn't mind it enough to change med.  Fewer sinus problems since husband (smoker) died. CXR 05/14/12 IMPRESSION:  No acute findings appear  Original Report Authenticated By: Reyes Ivan, M.D.  ROS-see HPI Constitutional:   No-   weight loss, night sweats, fevers, chills, fatigue, lassitude. HEENT:   No-  headaches, difficulty swallowing, tooth/dental problems, sore throat,       No-  sneezing, itching, ear ache, +nasal congestion, post nasal drip,  CV:  No-   chest pain, orthopnea, PND, swelling in lower extremities, anasarca, dizziness, palpitations Resp: No-   shortness of breath with exertion or at rest.              No-   productive cough,  +non-productive cough,  No- coughing up of blood.              No-   change in color of mucus.  No- wheezing.    Skin: No-   rash or lesions. GI:  No-   heartburn, indigestion, abdominal pain, nausea, vomiting,  GU: . MS:  No-   joint pain or swelling.   Neuro-     nothing unusual Psych:  No- change in mood or affect. Some grieving, otherwise no depression or anxiety.  No memory loss.  OBJ- Physical Exam General- Alert, Oriented, Affect-appropriate, Distress- none acute Skin- rash-none, lesions- none, excoriation- none Lymphadenopathy- none Head- atraumatic            Eyes- Gross vision intact, PERRLA, conjunctivae and secretions clear            Ears- Hearing, canals-normal            Nose- Clear, no-Septal dev, mucus, polyps, erosion, perforation             Throat- Mallampati II , mucosa clear , drainage- none, tonsils- atrophic Neck- flexible , trachea midline, no stridor , thyroid nl, carotid no bruit Chest - symmetrical excursion , unlabored           Heart/CV- +almost regular AFib , no murmur , no gallop  , no rub, nl s1 s2                           - JVD- none , edema- none, stasis changes- none, varices-  none           Lung- clear to P&A, wheeze- none, cough- none , dullness-none, rub- none           Chest wall-  Abd-  Br/ Gen/ Rectal- Not done, not indicated Extrem- cyanosis- none, clubbing, none, atrophy- none, strength- nl Neuro- grossly intact to observation  Assessment & Plan:

## 2013-05-03 NOTE — Assessment & Plan Note (Signed)
Good control. Able to continue allergy vaccine. Recognize in retrospect, effect of husband's smoking before he died.

## 2013-05-03 NOTE — Patient Instructions (Addendum)
We can continue allergy vaccine 1:10 GO  Please call as needed 

## 2013-05-04 ENCOUNTER — Ambulatory Visit: Payer: Medicare Other

## 2013-05-05 ENCOUNTER — Ambulatory Visit (INDEPENDENT_AMBULATORY_CARE_PROVIDER_SITE_OTHER): Payer: Medicare Other

## 2013-05-05 DIAGNOSIS — J309 Allergic rhinitis, unspecified: Secondary | ICD-10-CM

## 2013-05-12 ENCOUNTER — Ambulatory Visit (INDEPENDENT_AMBULATORY_CARE_PROVIDER_SITE_OTHER): Payer: Medicare Other

## 2013-05-12 DIAGNOSIS — J309 Allergic rhinitis, unspecified: Secondary | ICD-10-CM

## 2013-05-18 ENCOUNTER — Ambulatory Visit (INDEPENDENT_AMBULATORY_CARE_PROVIDER_SITE_OTHER): Payer: Medicare Other

## 2013-05-18 DIAGNOSIS — J309 Allergic rhinitis, unspecified: Secondary | ICD-10-CM

## 2013-05-19 ENCOUNTER — Ambulatory Visit: Payer: Medicare Other

## 2013-05-25 ENCOUNTER — Ambulatory Visit (INDEPENDENT_AMBULATORY_CARE_PROVIDER_SITE_OTHER): Payer: Medicare Other

## 2013-05-25 DIAGNOSIS — J309 Allergic rhinitis, unspecified: Secondary | ICD-10-CM

## 2013-06-01 ENCOUNTER — Ambulatory Visit: Payer: Medicare Other

## 2013-06-02 ENCOUNTER — Ambulatory Visit (INDEPENDENT_AMBULATORY_CARE_PROVIDER_SITE_OTHER): Payer: Medicare Other

## 2013-06-02 DIAGNOSIS — J309 Allergic rhinitis, unspecified: Secondary | ICD-10-CM

## 2013-06-09 ENCOUNTER — Ambulatory Visit (INDEPENDENT_AMBULATORY_CARE_PROVIDER_SITE_OTHER): Payer: Medicare Other

## 2013-06-09 DIAGNOSIS — J309 Allergic rhinitis, unspecified: Secondary | ICD-10-CM

## 2013-06-16 ENCOUNTER — Telehealth: Payer: Self-pay | Admitting: Internal Medicine

## 2013-06-16 ENCOUNTER — Ambulatory Visit (INDEPENDENT_AMBULATORY_CARE_PROVIDER_SITE_OTHER): Payer: Medicare Other

## 2013-06-16 DIAGNOSIS — J309 Allergic rhinitis, unspecified: Secondary | ICD-10-CM

## 2013-06-16 MED ORDER — EPINEPHRINE 0.3 MG/0.3ML IJ SOAJ: 0.3000 mg | Freq: Once | INTRAMUSCULAR | Status: DC

## 2013-06-16 NOTE — Telephone Encounter (Signed)
Called and spoke with pt and she is aware of CY recs.  Epi pen has been sent to the pharmacy and pt is aware. Nothing further is needed.

## 2013-06-16 NOTE — Telephone Encounter (Signed)
Per CY-have patient NOT eat that brand anymore; if she is able to eat the Brand name and no problems she can try to compare labels to see if there is any differences.   Please send Epipen Rx to local pharmacy-pt was shown how to use and given a co pay assistance card as well.

## 2013-06-16 NOTE — Telephone Encounter (Signed)
Spoke with the pt She states that she ate some off brand honey almond cereal a few nights ago and the am after woke up with sore mouth and swollen tongue Symptoms resolved after the day progressed, she ate the cereal again and go the same reaction  I advised to just avoid this cereal  She states that she does not understand this, b/c she has eaten the name brand of this cereal before with no problem Pt is fine now with no symptoms Please advise, thanks! Last ov 05/03/13 and next ov 05/04/14

## 2013-06-19 ENCOUNTER — Telehealth: Payer: Self-pay | Admitting: Internal Medicine

## 2013-06-19 NOTE — Telephone Encounter (Signed)
I spoke with pt and she stated she needs to see CDY regarding her ED visit from yesterday. She is scheduled to see him wed at 10:15. Nothing further needed

## 2013-06-21 ENCOUNTER — Ambulatory Visit (INDEPENDENT_AMBULATORY_CARE_PROVIDER_SITE_OTHER): Payer: Medicare Other

## 2013-06-21 ENCOUNTER — Ambulatory Visit (INDEPENDENT_AMBULATORY_CARE_PROVIDER_SITE_OTHER): Payer: Medicare Other | Admitting: Internal Medicine

## 2013-06-21 ENCOUNTER — Encounter: Payer: Self-pay | Admitting: Internal Medicine

## 2013-06-21 ENCOUNTER — Other Ambulatory Visit: Payer: Medicare Other

## 2013-06-21 DIAGNOSIS — J309 Allergic rhinitis, unspecified: Secondary | ICD-10-CM

## 2013-06-21 DIAGNOSIS — T788XXS Other adverse effects, not elsewhere classified, sequela: Secondary | ICD-10-CM

## 2013-06-21 DIAGNOSIS — Z23 Encounter for immunization: Secondary | ICD-10-CM

## 2013-06-21 MED ORDER — OLMESARTAN MEDOXOMIL-HCTZ 20-12.5 MG PO TABS: 1.0000 | ORAL_TABLET | Freq: Every day | ORAL | Status: DC

## 2013-06-21 NOTE — Progress Notes (Signed)
Subjective:    Patient ID: Vicki Morris, female    DOB: 04-27-1936, 77 y.o.   MRN: [id number]  HPI 77 yo F followed for allergic rhinitis and dyspnea, complicated by GERD, atrial fib.  Last here November 20, 2010. She wheezed a little last night after spreading mulch yesterday, but otherwise has done quite well considering the pollen season. She continues allergy vaccine here at 1:10. We discussed her vaccine, and meds.  05/03/12- 35 yo F followed for allergic rhinitis and dyspnea, complicated by GERD, atrial fib.  Widowed since last here- husband had metastatic lung cancer.  Cough in throat(tickle) gets worse at night and causes wheezing, Pt  is still getting allergy vaccine 1:10 and doing well on it. Ongoing reflux symptoms despite Prilosec. On an ACE inhibitor-we discussed potential for this to be the cause of her dry cough.  05/03/13- 50 yo F followed for allergic rhinitis and dyspnea, complicated by GERD, atrial fib FOLLOWS FOR: still on allergy vaccine 1:10 GH and doing well; Denies any flare ups at this time. Mild dry cough persists. CXR Neg last year. She and her PCP office realize this is likely from benazepril, but she doesn't mind it enough to change med.  Fewer sinus problems since husband (smoker) died. CXR 05/26/12 IMPRESSION:  No acute findings appear  Original Report Authenticated By: Reyes Ivan, M.D.  06/21/13- 73 yo F followed for allergic rhinitis and dyspnea, complicated by GERD, atrial fib ACUTE VISIT: had eaten chedder cheese stick and lil debbie oatmeal cookie Sunday morning; lunch was hot dog with slaw, chili, and mustard; snack was cinnamon roll(homemade); Went to sleep and about an hour later woke with swollen/cracked lips and tongue swelling. Went to [employer] [facility] given Benadryl and prednisone.  Pt did pick up Epipen on Friday and did not use. On Benazepril HCT- discussed ACE inhibitors again. She she had 4 episodes: 2 months ago and 6 weeks ago the inside of her  mouth got sore. One week ago she woke with swollen tongue, right greater than left side. On another occasion she awoke from nap mouth sore and lips feeling swollen. Went to ER where she was treated with prednisone 40 mg daily. She questions if trigger might be cereal. No other rash or wheeze. Today feels well except tongue "tingle".  ROS-see HPI Constitutional:   No-   weight loss, night sweats, fevers, chills, fatigue, lassitude. HEENT:   No-  headaches, difficulty swallowing, tooth/dental problems, +sore throat,       No-  sneezing, itching, ear ache, +nasal congestion, post nasal drip,  CV:  No-   chest pain, orthopnea, PND, swelling in lower extremities, anasarca, dizziness, palpitations Resp: No-   shortness of breath with exertion or at rest.              No-   productive cough,  +non-productive cough,  No- coughing up of blood.              No-   change in color of mucus.  No- wheezing.   Skin: No-   rash or lesions. GI:  No-   heartburn, indigestion, abdominal pain, nausea, vomiting,  GU: . MS:  No-   joint pain or swelling.   Neuro-     nothing unusual Psych:  No- change in mood or affect. Some grieving, otherwise no depression or anxiety.  No memory loss.  OBJ- Physical Exam General- Alert, Oriented, Affect-appropriate, Distress- none acute Skin- rash-none, lesions- none, excoriation- none Lymphadenopathy- none Head-  atraumatic            Eyes- Gross vision intact, PERRLA, conjunctivae and secretions clear            Ears- Hearing, canals-normal            Nose- Clear, no-Septal dev, mucus, polyps, erosion, perforation             Throat- Mallampati II , mucosa clear + dry, drainage- none, tonsils- atrophic Neck- flexible , trachea midline, no stridor , thyroid nl, carotid no bruit Chest - symmetrical excursion , unlabored           Heart/CV- +almost regular AFib , no murmur , no gallop  , no rub, nl s1 s2                           - JVD- none , edema- none, stasis changes-  none, varices- none           Lung- clear to P&A, wheeze- none, cough- none , dullness-none, rub- none           Chest wall-  Abd-  Br/ Gen/ Rectal- Not done, not indicated Extrem- cyanosis- none, clubbing, none, atrophy- none, strength- nl Neuro- grossly intact to observation  Assessment & Plan:

## 2013-06-21 NOTE — Patient Instructions (Addendum)
Instead of benazepril/ HCT (lotensin)  Take samples and script Benazepril 20/ HCT 12.5, one daily.  Aim to try this 4-6 weeks, watching to see if we think the Lotensin was causing dry cough and your mouth swelling  Flu vax  Order- lab Food IgE profile    Dx Food allergy                   Alpha-Gal IgE    Taper down prednisone- 20 mg daily x 2 days, then 1/2 tab (10 mg) daily x 2 days, then stop.

## 2013-06-22 ENCOUNTER — Ambulatory Visit: Payer: Medicare Other

## 2013-06-22 LAB — ALLERGEN FOOD PROFILE SPECIFIC IGE
Chicken IgE: <0.1 kU/L
Egg White IgE: <0.1 kU/L
Fish Cod: <0.1 kU/L
IgE (Immunoglobulin E), Serum: 3.3 IU/mL (ref 0.0–180.0)
Peanut IgE: <0.1 kU/L
Shrimp IgE: <0.1 kU/L
Tomato IgE: <0.1 kU/L
Tuna IgE: <0.1 kU/L
Wheat IgE: <0.1 kU/L

## 2013-06-23 LAB — GALACTOSE-ALPHA-1,3-GALACTOSE IGE: Galactose-alpha-1,3-galactose IgE: 1.14 kU/L — ABNORMAL HIGH (ref <0.35)

## 2013-06-26 NOTE — Progress Notes (Signed)
Quick Note:  Advised pt of labs results and she verbalized understanding and stated she would call back if had any questions or concerns ______

## 2013-06-29 ENCOUNTER — Ambulatory Visit (INDEPENDENT_AMBULATORY_CARE_PROVIDER_SITE_OTHER): Payer: Medicare Other

## 2013-06-29 DIAGNOSIS — J309 Allergic rhinitis, unspecified: Secondary | ICD-10-CM

## 2013-06-29 DIAGNOSIS — Z91018 Allergy to other foods: Secondary | ICD-10-CM | POA: Insufficient documentation

## 2013-06-29 NOTE — Assessment & Plan Note (Signed)
Describes 4 episodes of inflammation in her mouth that could be a mucositis. No distinct reflux or angioedema. It is time to take her off of her ACE inhibitor for while. Plan-change benazepril HCT to samples of available Benicar 20/12.5, reduce prednisone to 20 mg daily x3 days, then stop. Lab for Food IgE. Flu vax. Check IgE to Gal-2,3,-Gal for possible meat allergy.

## 2013-06-30 ENCOUNTER — Ambulatory Visit: Payer: Medicare Other

## 2013-07-06 ENCOUNTER — Ambulatory Visit (INDEPENDENT_AMBULATORY_CARE_PROVIDER_SITE_OTHER): Payer: Medicare Other

## 2013-07-06 DIAGNOSIS — J309 Allergic rhinitis, unspecified: Secondary | ICD-10-CM

## 2013-07-07 ENCOUNTER — Ambulatory Visit: Payer: Medicare Other

## 2013-07-19 ENCOUNTER — Ambulatory Visit (INDEPENDENT_AMBULATORY_CARE_PROVIDER_SITE_OTHER): Payer: Medicare Other

## 2013-07-19 DIAGNOSIS — J309 Allergic rhinitis, unspecified: Secondary | ICD-10-CM

## 2013-07-28 ENCOUNTER — Ambulatory Visit (INDEPENDENT_AMBULATORY_CARE_PROVIDER_SITE_OTHER): Payer: Medicare Other

## 2013-07-28 ENCOUNTER — Ambulatory Visit: Payer: Medicare Other

## 2013-07-28 DIAGNOSIS — J309 Allergic rhinitis, unspecified: Secondary | ICD-10-CM

## 2013-07-31 ENCOUNTER — Encounter: Payer: Self-pay | Admitting: Internal Medicine

## 2013-07-31 ENCOUNTER — Ambulatory Visit (INDEPENDENT_AMBULATORY_CARE_PROVIDER_SITE_OTHER): Payer: Medicare Other | Admitting: Internal Medicine

## 2013-07-31 DIAGNOSIS — Z5189 Encounter for other specified aftercare: Secondary | ICD-10-CM

## 2013-07-31 DIAGNOSIS — I4891 Unspecified atrial fibrillation: Secondary | ICD-10-CM

## 2013-07-31 NOTE — Patient Instructions (Signed)
Ok to eat small portions of mammal meats- beef, pork, lamb, and watch for reaction. Avoid what causes you problems.  Please call as needed  We can continue your allergy vaccine 1:10 GH

## 2013-07-31 NOTE — Progress Notes (Signed)
Subjective:    Patient ID: Vicki Morris, female    DOB: 1935-11-28, 77 y.o.   MRN: [id number]  HPI 77 yo F followed for allergic rhinitis and dyspnea, complicated by GERD, atrial fib.  Last here November 20, 2010. She wheezed a little last night after spreading mulch yesterday, but otherwise has done quite well considering the pollen season. She continues allergy vaccine here at 1:10. We discussed her vaccine, and meds.  05/03/12- 65 yo F followed for allergic rhinitis and dyspnea, complicated by GERD, atrial fib.  Widowed since last here- husband had metastatic lung cancer.  Cough in throat(tickle) gets worse at night and causes wheezing, Pt  is still getting allergy vaccine 1:10 and doing well on it. Ongoing reflux symptoms despite Prilosec. On an ACE inhibitor-we discussed potential for this to be the cause of her dry cough.  05/03/13- 63 yo F followed for allergic rhinitis and dyspnea, complicated by GERD, atrial fib FOLLOWS FOR: still on allergy vaccine 1:10 GH and doing well; Denies any flare ups at this time. Mild dry cough persists. CXR Neg last year. She and her PCP office realize this is likely from benazepril, but she doesn't mind it enough to change med.  Fewer sinus problems since husband (smoker) died. CXR 2012/05/30 IMPRESSION:  No acute findings appear  Original Report Authenticated By: Reyes Ivan, M.D.  06/21/13- 64 yo F followed for allergic rhinitis and dyspnea, complicated by GERD, atrial fib ACUTE VISIT: had eaten chedder cheese stick and lil debbie oatmeal cookie Sunday morning; lunch was hot dog with slaw, chili, and mustard; snack was cinnamon roll(homemade); Went to sleep and about an hour later woke with swollen/cracked lips and tongue swelling. Went to [employer] [facility] given Benadryl and prednisone.  Pt did pick up Epipen on Friday and did not use. On Benazepril HCT- discussed ACE inhibitors again. She she had 4 episodes: 2 months ago and 6 weeks ago the inside of her  mouth got sore. One week ago she woke with swollen tongue, right greater than left side. On another occasion she awoke from nap mouth sore and lips feeling swollen. Went to ER where she was treated with prednisone 40 mg daily. She questions if trigger might be cereal. No other rash or wheeze. Today feels well except tongue "tingle".  07/31/13- 6 yo F followed for allergic rhinitis and dyspnea, complicated by GERD, atrial fib FOLLOWS FOR: sitll on vaccine and doing well; would like to discuss labs and food allergies in detail. No longer dry cough or tongue swelling since switching from benazepril to Benicar. Continues allergy vaccine 1:10 GH. Feels well. Food IgE allergy profile 06/21/2013-Negative, total IgE 3.3. "Alpha-Gal" galactose-alpha-1, 3-galactose IgE elevated 1.14, significance unclear. Watch for reaction to meat. She had a total of 4 episodes of waking with tongue swelling. None recent. Has EpiPen and Benadryl. Discussed reaction to meat, preservatives in hot dogs?  ROS-see HPI Constitutional:   No-   weight loss, night sweats, fevers, chills, fatigue, lassitude. HEENT:   No-  headaches, difficulty swallowing, tooth/dental problems, +sore throat,       No-  sneezing, itching, ear ache, +nasal congestion, post nasal drip,  CV:  No-   chest pain, orthopnea, PND, swelling in lower extremities, anasarca, dizziness, palpitations Resp: No-   shortness of breath with exertion or at rest.              No-   productive cough,  +non-productive cough,  No- coughing up of blood.  No-   change in color of mucus.  No- wheezing.   Skin: No-   rash or lesions. GI:  No-   heartburn, indigestion, abdominal pain, nausea, vomiting,  GU: . MS:  No-   joint pain or swelling.   Neuro-     nothing unusual Psych:  No- change in mood or affect. Some grieving, otherwise no depression or anxiety.  No memory loss.  OBJ- Physical Exam General- Alert, Oriented, Affect-appropriate, Distress- none  acute Skin- rash-none, lesions- none, excoriation- none Lymphadenopathy- none Head- atraumatic            Eyes- Gross vision intact, PERRLA, conjunctivae and secretions clear            Ears- Hearing, canals-normal            Nose- Clear, no-Septal dev, mucus, polyps, erosion, perforation             Throat- Mallampati II , mucosa clear + dry, drainage- none, tonsils- atrophic Neck- flexible , trachea midline, no stridor , thyroid nl, carotid no bruit Chest - symmetrical excursion , unlabored           Heart/CV- +almost regular AFib , no murmur , no gallop  , no rub, nl s1 s2                           - JVD- none , edema- none, stasis changes- none, varices- none           Lung- clear to P&A, wheeze- none, cough- none , dullness-none, rub- none           Chest wall-  Abd-  Br/ Gen/ Rectal- Not done, not indicated Extrem- cyanosis- none, clubbing, none, atrophy- none, strength- nl Neuro- grossly intact to observation  Assessment & Plan:

## 2013-08-04 ENCOUNTER — Ambulatory Visit (INDEPENDENT_AMBULATORY_CARE_PROVIDER_SITE_OTHER): Payer: Medicare Other

## 2013-08-04 DIAGNOSIS — J309 Allergic rhinitis, unspecified: Secondary | ICD-10-CM

## 2013-08-11 ENCOUNTER — Ambulatory Visit (INDEPENDENT_AMBULATORY_CARE_PROVIDER_SITE_OTHER): Payer: Medicare Other

## 2013-08-11 DIAGNOSIS — J309 Allergic rhinitis, unspecified: Secondary | ICD-10-CM

## 2013-08-13 NOTE — Assessment & Plan Note (Signed)
Controlled ventricular response rate 

## 2013-08-13 NOTE — Assessment & Plan Note (Signed)
There is possibility she may react to mammalian meats (not  Fish or poultry) Plan-watch for meat allergy as discussed. Eat small amounts and wait 24 hours before another exposure, watching for tongue swelling

## 2013-08-18 ENCOUNTER — Ambulatory Visit (INDEPENDENT_AMBULATORY_CARE_PROVIDER_SITE_OTHER): Payer: Medicare Other

## 2013-08-18 DIAGNOSIS — J309 Allergic rhinitis, unspecified: Secondary | ICD-10-CM

## 2013-08-21 ENCOUNTER — Telehealth: Payer: Self-pay | Admitting: Internal Medicine

## 2013-08-21 NOTE — Telephone Encounter (Signed)
ATC PT LINE BUSY X 3 WCB 

## 2013-08-21 NOTE — Telephone Encounter (Signed)
I called and spoke with pt. She reports she was told at her last OV if she began to have any more reactions to call us. She had a allergic reaction Friday from salmon at [employer]. Not sure sur eif this was bc it was cook with steak on the grill. Yesterday she ate rotisserie chicken and her mouth swelled. She is wanting to know what is the next step or what she should do. Please advise Dr. Maple Hudson thanks

## 2013-08-21 NOTE — Telephone Encounter (Signed)
The only suggestion we saw on her food allergy assessment was for mammalian meats- beef/ pork/ lamb. Fish and chicken are not covered by this antibody, unless two foods are cooked together. Was she eating out when this happened, or cooking herself? Suggest keeping a food diary of what she eats, when, and what reactions she notes.  It may be this is not a food related process at all.  Suggest daily antihistamine like Allegra 180 or Zyrtec to see if this will at least blunt reactions.  Avoid foods that definitely seem to cause trouble.

## 2013-08-22 ENCOUNTER — Ambulatory Visit: Payer: Medicare Other | Admitting: Internal Medicine

## 2013-08-22 NOTE — Telephone Encounter (Signed)
I spoke with the pt and she states she was eating at a restaurant when this occurred. She will keep a food diary and start antihistamine and discuss further at follow-up. Carron Curie, CMA

## 2013-08-25 ENCOUNTER — Ambulatory Visit: Payer: Medicare Other

## 2013-08-25 ENCOUNTER — Ambulatory Visit (INDEPENDENT_AMBULATORY_CARE_PROVIDER_SITE_OTHER): Payer: Medicare Other

## 2013-08-25 DIAGNOSIS — J309 Allergic rhinitis, unspecified: Secondary | ICD-10-CM

## 2013-09-01 ENCOUNTER — Ambulatory Visit (INDEPENDENT_AMBULATORY_CARE_PROVIDER_SITE_OTHER): Payer: Medicare Other

## 2013-09-01 DIAGNOSIS — J309 Allergic rhinitis, unspecified: Secondary | ICD-10-CM

## 2013-09-08 ENCOUNTER — Ambulatory Visit (INDEPENDENT_AMBULATORY_CARE_PROVIDER_SITE_OTHER): Payer: Medicare Other

## 2013-09-08 DIAGNOSIS — J309 Allergic rhinitis, unspecified: Secondary | ICD-10-CM

## 2013-09-11 ENCOUNTER — Ambulatory Visit (INDEPENDENT_AMBULATORY_CARE_PROVIDER_SITE_OTHER): Payer: Medicare Other

## 2013-09-11 DIAGNOSIS — J309 Allergic rhinitis, unspecified: Secondary | ICD-10-CM

## 2013-09-14 ENCOUNTER — Ambulatory Visit (INDEPENDENT_AMBULATORY_CARE_PROVIDER_SITE_OTHER): Payer: Medicare Other

## 2013-09-14 DIAGNOSIS — J309 Allergic rhinitis, unspecified: Secondary | ICD-10-CM

## 2013-09-21 ENCOUNTER — Ambulatory Visit: Payer: Medicare Other

## 2013-09-22 ENCOUNTER — Ambulatory Visit (INDEPENDENT_AMBULATORY_CARE_PROVIDER_SITE_OTHER): Payer: Medicare Other

## 2013-09-22 DIAGNOSIS — J309 Allergic rhinitis, unspecified: Secondary | ICD-10-CM

## 2013-09-29 ENCOUNTER — Ambulatory Visit (INDEPENDENT_AMBULATORY_CARE_PROVIDER_SITE_OTHER): Payer: Medicare Other

## 2013-09-29 DIAGNOSIS — J309 Allergic rhinitis, unspecified: Secondary | ICD-10-CM

## 2013-10-06 ENCOUNTER — Ambulatory Visit (INDEPENDENT_AMBULATORY_CARE_PROVIDER_SITE_OTHER): Payer: Medicare Other

## 2013-10-06 DIAGNOSIS — J309 Allergic rhinitis, unspecified: Secondary | ICD-10-CM

## 2013-10-13 ENCOUNTER — Ambulatory Visit (INDEPENDENT_AMBULATORY_CARE_PROVIDER_SITE_OTHER): Payer: Medicare Other

## 2013-10-13 DIAGNOSIS — J309 Allergic rhinitis, unspecified: Secondary | ICD-10-CM

## 2013-10-20 ENCOUNTER — Ambulatory Visit (INDEPENDENT_AMBULATORY_CARE_PROVIDER_SITE_OTHER): Payer: Medicare Other

## 2013-10-20 DIAGNOSIS — J309 Allergic rhinitis, unspecified: Secondary | ICD-10-CM

## 2013-10-26 ENCOUNTER — Encounter: Payer: Self-pay | Admitting: Internal Medicine

## 2013-10-27 ENCOUNTER — Ambulatory Visit: Payer: Medicare Other

## 2013-11-03 ENCOUNTER — Ambulatory Visit (INDEPENDENT_AMBULATORY_CARE_PROVIDER_SITE_OTHER): Payer: Medicare Other

## 2013-11-03 DIAGNOSIS — J309 Allergic rhinitis, unspecified: Secondary | ICD-10-CM

## 2013-11-08 ENCOUNTER — Encounter: Payer: Self-pay | Admitting: Internal Medicine

## 2013-11-09 ENCOUNTER — Ambulatory Visit (INDEPENDENT_AMBULATORY_CARE_PROVIDER_SITE_OTHER): Payer: Medicare Other

## 2013-11-09 DIAGNOSIS — J309 Allergic rhinitis, unspecified: Secondary | ICD-10-CM

## 2013-11-10 ENCOUNTER — Ambulatory Visit: Payer: Medicare Other

## 2013-11-16 ENCOUNTER — Ambulatory Visit (INDEPENDENT_AMBULATORY_CARE_PROVIDER_SITE_OTHER): Payer: Medicare Other

## 2013-11-16 DIAGNOSIS — J309 Allergic rhinitis, unspecified: Secondary | ICD-10-CM

## 2013-11-17 ENCOUNTER — Ambulatory Visit: Payer: Medicare Other

## 2013-11-24 ENCOUNTER — Ambulatory Visit (INDEPENDENT_AMBULATORY_CARE_PROVIDER_SITE_OTHER): Payer: Medicare Other

## 2013-11-24 DIAGNOSIS — J309 Allergic rhinitis, unspecified: Secondary | ICD-10-CM

## 2013-12-01 ENCOUNTER — Ambulatory Visit: Payer: Medicare Other

## 2013-12-08 ENCOUNTER — Ambulatory Visit (INDEPENDENT_AMBULATORY_CARE_PROVIDER_SITE_OTHER): Payer: Medicare Other

## 2013-12-08 DIAGNOSIS — J309 Allergic rhinitis, unspecified: Secondary | ICD-10-CM

## 2013-12-14 ENCOUNTER — Ambulatory Visit (INDEPENDENT_AMBULATORY_CARE_PROVIDER_SITE_OTHER): Payer: Medicare Other

## 2013-12-14 DIAGNOSIS — J309 Allergic rhinitis, unspecified: Secondary | ICD-10-CM

## 2013-12-15 ENCOUNTER — Ambulatory Visit: Payer: Medicare Other

## 2013-12-21 ENCOUNTER — Ambulatory Visit: Payer: Medicare Other

## 2013-12-22 ENCOUNTER — Ambulatory Visit (INDEPENDENT_AMBULATORY_CARE_PROVIDER_SITE_OTHER): Payer: Medicare Other

## 2013-12-22 DIAGNOSIS — J309 Allergic rhinitis, unspecified: Secondary | ICD-10-CM

## 2013-12-29 ENCOUNTER — Ambulatory Visit (INDEPENDENT_AMBULATORY_CARE_PROVIDER_SITE_OTHER): Payer: Medicare Other

## 2013-12-29 DIAGNOSIS — J309 Allergic rhinitis, unspecified: Secondary | ICD-10-CM

## 2014-01-04 ENCOUNTER — Ambulatory Visit (INDEPENDENT_AMBULATORY_CARE_PROVIDER_SITE_OTHER): Payer: Medicare Other

## 2014-01-04 DIAGNOSIS — J309 Allergic rhinitis, unspecified: Secondary | ICD-10-CM

## 2014-01-11 ENCOUNTER — Ambulatory Visit (INDEPENDENT_AMBULATORY_CARE_PROVIDER_SITE_OTHER): Payer: Medicare Other

## 2014-01-11 DIAGNOSIS — J309 Allergic rhinitis, unspecified: Secondary | ICD-10-CM

## 2014-01-18 ENCOUNTER — Telehealth: Payer: Self-pay | Admitting: Internal Medicine

## 2014-01-18 MED ORDER — AMOXICILLIN-POT CLAVULANATE 500-125 MG PO TABS: 1.0000 | ORAL_TABLET | Freq: Two times a day (BID) | ORAL | Status: DC

## 2014-01-18 MED ORDER — PREDNISONE 20 MG PO TABS: 20.0000 mg | ORAL_TABLET | Freq: Every day | ORAL | Status: DC

## 2014-01-18 NOTE — Telephone Encounter (Signed)
ATC pt line busy x 4 wcb 

## 2014-01-18 NOTE — Telephone Encounter (Signed)
Patient c/o head congestion, HA, dry "deep"cough, wheezing and SOB x 2 weeks  Pt feels this might be a sinus infection.   [facility] [place]  Allergies  Allergen Reactions  . Clarithromycin   . Nabumetone   . Sulfonamide Derivatives    Please advise Dr Maple HudsonYoung. Thanks.    Medication List       This list is accurate as of: 01/18/14  5:15 PM.  Always use your most recent med list.               diltiazem 240 MG 24 hr capsule  Commonly known as:  CARDIZEM CD  Take 1 capsule by mouth daily.     EPINEPHrine 0.3 mg/0.3 mL Soaj injection  Commonly known as:  EPI-PEN  Inject 0.3 mLs (0.3 mg total) into the muscle once.     levothyroxine 100 MCG tablet  Commonly known as:  SYNTHROID, LEVOTHROID  Take 100 mcg by mouth daily.     LORazepam 0.5 MG tablet  Commonly known as:  ATIVAN  Take 0.5 mg by mouth every 8 (eight) hours as needed.     nitroGLYCERIN 0.4 MG SL tablet  Commonly known as:  NITROSTAT  Place 1 tablet (0.4 mg total) under the tongue every 5 (five) minutes as needed for chest pain.     olmesartan-hydrochlorothiazide 20-12.5 MG per tablet  Commonly known as:  BENICAR HCT  Take 1 tablet by mouth daily.     potassium chloride SA 20 MEQ tablet  Commonly known as:  K-DUR,KLOR-CON  Take 20 mEq by mouth. On Sundays and Wednesdays     predniSONE 20 MG tablet  Commonly known as:  DELTASONE  Taper ad directed per ER     rosuvastatin 10 MG tablet  Commonly known as:  CRESTOR  Take 10 mg by mouth daily.     sertraline 50 MG tablet  Commonly known as:  ZOLOFT  Take 50 mg by mouth daily.     warfarin 2 MG tablet  Commonly known as:  COUMADIN  Take 2 mg by mouth daily. As directed

## 2014-01-18 NOTE — Telephone Encounter (Signed)
I called made pt aware of recs/ rx sent in. Nothing further needed

## 2014-01-18 NOTE — Telephone Encounter (Signed)
ATC PT line still busy wcb

## 2014-01-18 NOTE — Telephone Encounter (Signed)
ATC PT. Line still busy wcb

## 2014-01-18 NOTE — Telephone Encounter (Signed)
Sounds like an asthmatic bronchitis too. Offer augmentin 500 mg, # 14, 1 twice daily   She might also need prednisone 20 mg, # 5, 1 daily

## 2014-01-19 ENCOUNTER — Ambulatory Visit (INDEPENDENT_AMBULATORY_CARE_PROVIDER_SITE_OTHER): Payer: Medicare Other

## 2014-01-19 DIAGNOSIS — J309 Allergic rhinitis, unspecified: Secondary | ICD-10-CM

## 2014-01-25 ENCOUNTER — Ambulatory Visit (INDEPENDENT_AMBULATORY_CARE_PROVIDER_SITE_OTHER): Payer: Medicare Other

## 2014-01-25 DIAGNOSIS — J309 Allergic rhinitis, unspecified: Secondary | ICD-10-CM

## 2014-01-26 ENCOUNTER — Ambulatory Visit: Payer: Medicare Other

## 2014-01-29 ENCOUNTER — Ambulatory Visit (INDEPENDENT_AMBULATORY_CARE_PROVIDER_SITE_OTHER): Payer: Medicare Other | Admitting: Internal Medicine

## 2014-01-29 ENCOUNTER — Encounter: Payer: Self-pay | Admitting: Internal Medicine

## 2014-01-29 VITALS — BP 122/88 | HR 59 | Ht 64.0 in | Wt 192.6 lb

## 2014-01-29 DIAGNOSIS — J302 Other seasonal allergic rhinitis: Secondary | ICD-10-CM

## 2014-01-29 DIAGNOSIS — J309 Allergic rhinitis, unspecified: Secondary | ICD-10-CM

## 2014-01-29 DIAGNOSIS — Z91018 Allergy to other foods: Secondary | ICD-10-CM

## 2014-01-29 DIAGNOSIS — J3089 Other allergic rhinitis: Secondary | ICD-10-CM

## 2014-01-29 NOTE — Progress Notes (Signed)
Subjective:    Patient ID: Vicki Morris, female    DOB: 02/17/1936, 78 y.o.   MRN: [id number]  HPI 78 yo F followed for allergic rhinitis and dyspnea, complicated by GERD, atrial fib.  Last here November 20, 2010. She wheezed a little last night after spreading mulch yesterday, but otherwise has done quite well considering the pollen season. She continues allergy vaccine here at 1:10. We discussed her vaccine, and meds.  05/03/12- 78 yo F followed for allergic rhinitis and dyspnea, complicated by GERD, atrial fib.  Widowed since last here- husband had metastatic lung cancer.  Cough in throat(tickle) gets worse at night and causes wheezing, Pt  is still getting allergy vaccine 1:10 and doing well on it. Ongoing reflux symptoms despite Prilosec. On an ACE inhibitor-we discussed potential for this to be the cause of her dry cough.  05/03/13- 78 yo F followed for allergic rhinitis and dyspnea, complicated by GERD, atrial fib FOLLOWS FOR: still on allergy vaccine 1:10 GH and doing well; Denies any flare ups at this time. Mild dry cough persists. CXR Neg last year. She and her PCP office realize this is likely from benazepril, but she doesn't mind it enough to change med.  Fewer sinus problems since husband (smoker) died. CXR 05/04/12 IMPRESSION:  No acute findings appear  Original Report Authenticated By: Reyes IvanMELINDA A. BLIETZ, M.D.  06/21/13- [age] yo F followed for allergic rhinitis and dyspnea, complicated by GERD, atrial fib ACUTE VISIT: had eaten chedder cheese stick and lil debbie oatmeal cookie Sunday morning; lunch was hot dog with slaw, chili, and mustard; snack was cinnamon roll(homemade); Went to sleep and about an hour later woke with swollen/cracked lips and tongue swelling. [facility] and prednisone.  Pt did pick up Epipen on Friday and did not use. On Benazepril HCT- discussed ACE inhibitors again. She she had 4 episodes: 2 months ago and 6 weeks ago the inside of her  mouth got sore. One week ago she woke with swollen tongue, right greater than left side. On another occasion she awoke from nap mouth sore and lips feeling swollen. Went to ER where she was treated with prednisone 40 mg daily. She questions if trigger might be cereal. No other rash or wheeze. Today feels well except tongue "tingle".  07/31/13- 78 yo F followed for allergic rhinitis and dyspnea, complicated by GERD, atrial fib FOLLOWS FOR: sitll on vaccine and doing well; would like to discuss labs and food allergies in detail. No longer dry cough or tongue swelling since switching from benazepril to Benicar. Continues allergy vaccine 1:10 GH. Feels well. Food IgE allergy profile 06/21/2013-Negative, total IgE 3.3. "Alpha-Gal" galactose-alpha-1, 3-galactose IgE elevated 1.14, significance unclear. Watch for reaction to meat. She had a total of 4 episodes of waking with tongue swelling. None recent. Has EpiPen and Benadryl. Discussed reaction to meat, preservatives in hot dogs?  01/30/14- 78 yo F followed for allergic rhinitis, angioedema and dyspnea, complicated by GERD, atrial fib FOLLOWS FOR:  Allergy Vaccine 1:10 GH doing well.  Allergies doing well some pressure in forehead area. We had called antibiotic and cleared her sinus infection since last here.Minor stuffiness lying down. Tongue has swollen again- most recently after grilled chicken cooked on same grill w/ beef- used Epipen. Can eat fish and chicken, consistent with elevated Alpha-Gal IgE documented last visit.  ROS-see HPI Constitutional:   No-   weight loss, night sweats, fevers, chills, fatigue, lassitude. HEENT:   No-  headaches, difficulty swallowing,  tooth/dental problems, sore throat,       No-  sneezing, itching, ear ache, +nasal congestion, post nasal drip,  CV:  No-   chest pain, orthopnea, PND, swelling in lower extremities, anasarca, dizziness, palpitations Resp: No-   shortness of breath with exertion or at rest.               No-   productive cough,  +non-productive cough,  No- coughing up of blood.              No-   change in color of mucus.  No- wheezing.   Skin: No-   rash or lesions. GI:  No-   heartburn, indigestion, abdominal pain, nausea, vomiting,  GU: . MS:  No-   joint pain or swelling.   Neuro-     nothing unusual Psych:  No- change in mood or affect. Some grieving, otherwise no depression or anxiety.  No memory loss.  OBJ- Physical Exam General- Alert, Oriented, Affect-appropriate, Distress- none acute Skin- rash-none, lesions- none, excoriation- none Lymphadenopathy- none Head- atraumatic            Eyes- Gross vision intact, PERRLA, conjunctivae and secretions clear            Ears- Hearing, canals-normal            Nose- Clear, no-Septal dev, mucus, polyps, erosion, perforation             Throat- Mallampati II , mucosa clear + dry, drainage- none, tonsils- atrophic Neck- flexible , trachea midline, no stridor , thyroid nl, carotid no bruit Chest - symmetrical excursion , unlabored           Heart/CV- +almost regular AFib , no murmur , no gallop  , no rub, nl s1 s2                           - JVD- none , edema- none, stasis changes- none, varices- none           Lung- clear to P&A, wheeze- none, cough- none , dullness-none, rub- none           Chest wall-  Abd-  Br/ Gen/ Rectal- Not done, not indicated Extrem- cyanosis- none, clubbing, none, atrophy- none, strength- nl Neuro- grossly intact to observation  Assessment & Plan:

## 2014-01-29 NOTE — Assessment & Plan Note (Signed)
Angioedema of tongue triggered by mammal meats/ elevated IgE for alpha-gal Plan- pretreat antihistamine, carry Epipen, avoid meat except chicken, seafood.

## 2014-01-29 NOTE — Assessment & Plan Note (Signed)
Continues to do well with allergy vaccine.

## 2014-01-29 NOTE — Patient Instructions (Signed)
You are doing right to carefully avoid meats except chicken and seafood.  If ever a question, pretreat with an antihistamine at least an hour ahead of exposure, and carry your Epipen  We can continue allergy vaccine 1:10 GH

## 2014-02-01 ENCOUNTER — Ambulatory Visit (INDEPENDENT_AMBULATORY_CARE_PROVIDER_SITE_OTHER): Payer: Medicare Other

## 2014-02-01 DIAGNOSIS — J309 Allergic rhinitis, unspecified: Secondary | ICD-10-CM

## 2014-02-06 ENCOUNTER — Ambulatory Visit (INDEPENDENT_AMBULATORY_CARE_PROVIDER_SITE_OTHER): Payer: Medicare Other

## 2014-02-06 DIAGNOSIS — J309 Allergic rhinitis, unspecified: Secondary | ICD-10-CM

## 2014-02-07 ENCOUNTER — Ambulatory Visit (INDEPENDENT_AMBULATORY_CARE_PROVIDER_SITE_OTHER): Payer: Medicare Other

## 2014-02-07 DIAGNOSIS — J309 Allergic rhinitis, unspecified: Secondary | ICD-10-CM

## 2014-02-09 ENCOUNTER — Ambulatory Visit: Payer: Medicare Other

## 2014-02-16 ENCOUNTER — Ambulatory Visit (INDEPENDENT_AMBULATORY_CARE_PROVIDER_SITE_OTHER): Payer: Medicare Other

## 2014-02-16 DIAGNOSIS — J309 Allergic rhinitis, unspecified: Secondary | ICD-10-CM

## 2014-02-23 ENCOUNTER — Ambulatory Visit (INDEPENDENT_AMBULATORY_CARE_PROVIDER_SITE_OTHER): Payer: Medicare Other

## 2014-02-23 DIAGNOSIS — J309 Allergic rhinitis, unspecified: Secondary | ICD-10-CM

## 2014-03-02 ENCOUNTER — Ambulatory Visit (INDEPENDENT_AMBULATORY_CARE_PROVIDER_SITE_OTHER): Payer: Medicare Other

## 2014-03-02 DIAGNOSIS — J309 Allergic rhinitis, unspecified: Secondary | ICD-10-CM

## 2014-03-08 ENCOUNTER — Ambulatory Visit (INDEPENDENT_AMBULATORY_CARE_PROVIDER_SITE_OTHER): Payer: Medicare Other

## 2014-03-08 ENCOUNTER — Telehealth: Payer: Self-pay | Admitting: Internal Medicine

## 2014-03-08 DIAGNOSIS — J309 Allergic rhinitis, unspecified: Secondary | ICD-10-CM

## 2014-03-08 MED ORDER — PREDNISONE 10 MG PO TABS: ORAL_TABLET | ORAL | Status: DC

## 2014-03-08 NOTE — Telephone Encounter (Signed)
Spoke with pt Pt c/o incr wheezing, dry cough x 25mo. and chest tightness  Feels like her chest tightness is worsening--finding it difficult to cough. Pt reports waking up during the night. All symptoms are worse at night.  [phone] pt cell #  Allergies  Allergen Reactions  . Clarithromycin   . Nabumetone   . Sulfonamide Derivatives   [facility] [place]   Please advise Dr Maple Hudson. Thanks.

## 2014-03-08 NOTE — Telephone Encounter (Signed)
Offer prednisone 10 mg, # 20, 4 X 2 DAYS, 3 X 2 DAYS, 2 X 2 DAYS, 1 X 2 DAYS Let us know if this doesn't help

## 2014-03-08 NOTE — Telephone Encounter (Signed)
Pt aware of recs. RX called in. Nothing further needed 

## 2014-03-09 ENCOUNTER — Ambulatory Visit: Payer: Medicare Other

## 2014-03-14 ENCOUNTER — Ambulatory Visit (INDEPENDENT_AMBULATORY_CARE_PROVIDER_SITE_OTHER): Payer: Medicare Other | Admitting: Physician Assistant

## 2014-03-14 ENCOUNTER — Encounter: Payer: Self-pay | Admitting: Physician Assistant

## 2014-03-14 VITALS — BP 158/82 | HR 56 | Ht 64.0 in | Wt 188.0 lb

## 2014-03-14 DIAGNOSIS — I4891 Unspecified atrial fibrillation: Secondary | ICD-10-CM

## 2014-03-14 DIAGNOSIS — R0989 Other specified symptoms and signs involving the circulatory and respiratory systems: Secondary | ICD-10-CM

## 2014-03-14 DIAGNOSIS — R0600 Dyspnea, unspecified: Secondary | ICD-10-CM

## 2014-03-14 DIAGNOSIS — R0602 Shortness of breath: Secondary | ICD-10-CM

## 2014-03-14 DIAGNOSIS — I10 Essential (primary) hypertension: Secondary | ICD-10-CM | POA: Insufficient documentation

## 2014-03-14 DIAGNOSIS — I519 Heart disease, unspecified: Secondary | ICD-10-CM

## 2014-03-14 DIAGNOSIS — R0609 Other forms of dyspnea: Secondary | ICD-10-CM

## 2014-03-14 DIAGNOSIS — I5189 Other ill-defined heart diseases: Secondary | ICD-10-CM

## 2014-03-14 DIAGNOSIS — N289 Disorder of kidney and ureter, unspecified: Secondary | ICD-10-CM

## 2014-03-14 DIAGNOSIS — I480 Paroxysmal atrial fibrillation: Secondary | ICD-10-CM

## 2014-03-14 MED ORDER — OLMESARTAN MEDOXOMIL-HCTZ 40-25 MG PO TABS: 1.0000 | ORAL_TABLET | Freq: Every day | ORAL | Status: DC

## 2014-03-14 NOTE — Patient Instructions (Signed)
Your physician has requested that you have a lexiscan myoview BEFORE APPOINTMENT WITH DR. Swaziland. Please follow instruction sheet, as given.  Your physician has requested that you have an echocardiogram BEFORE APPOINTMENT WITH DR. Swaziland. Echocardiography is a painless test that uses sound waves to create images of your heart. It provides your doctor with information about the size and shape of your heart and how well your heart's chambers and valves are working. This procedure takes approximately one hour. There are no restrictions for this procedure.  Your physician recommends that you return for lab work in: IN 2 WEEKS (BMET)  Your physician recommends that you schedule a follow-up appointment in: WITH DR. Swaziland IN 1 MONTH  Your physician has recommended you make the following change in your medication:   INCREASE YOUR BENICAR-HCT 40-25 MG ONCE A DAY

## 2014-03-14 NOTE — Assessment & Plan Note (Signed)
Patient complains of 3-4 month history of worsening dyspnea on exertion. She did have wheezing and is on prednisone. Her blood pressure is up. I will increase her Benicar HCT to 40 mg/25 mg daily. We'll check a 2-D echo to rule out diastolic dysfunction. We'll also order a Lexiscan Myoview to rule out ischemia appear

## 2014-03-14 NOTE — Assessment & Plan Note (Signed)
Maintaining normal sinus rhythm 

## 2014-03-14 NOTE — Assessment & Plan Note (Signed)
Patient's blood pressure is slightly elevated today. Will increase Benicar HCT 40 mg/25 mg daily.

## 2014-03-14 NOTE — Progress Notes (Signed)
HPI: This is a 78 year old female patient Dr. SwazilandJordan who has history of paroxysmal atrial fibrillation, hypertension, prior TIA, hyperlipidemia, GERD, and hypothyroidism. She is on chronic Coumadin. Stress test 2 years ago for dyspnea on exertion showed EF of 64%, apical thinning, small subendocardial scar could not be excluded but there was no ischemia.  Patient is here because of 3-4 month history of dyspnea on exertion. She also has some edema at night and has gained about 6 pounds since she was last seen. She is taking prednisone because of wheezing after an upper respiratory infection. She denies any significant palpitations with exertion but does notice some when she lays down at night. She denies any chest pain or tightness, dizziness, or presyncope.  Allergies -- Clarithromycin   -- Nabumetone   -- Sulfonamide Derivatives   Current Outpatient Prescriptions on File Prior to Visit: diltiazem (CARDIZEM CD) 240 MG 24 hr capsule, Take 1 capsule by mouth daily., Disp: , Rfl:  levothyroxine (SYNTHROID, LEVOTHROID) 100 MCG tablet, Take 100 mcg by mouth daily.  , Disp: , Rfl:  LORazepam (ATIVAN) 0.5 MG tablet, Take 0.5 mg by mouth every 8 (eight) hours as needed., Disp: , Rfl:  nitroGLYCERIN (NITROSTAT) 0.4 MG SL tablet, Place 1 tablet (0.4 mg total) under the tongue every 5 (five) minutes as needed for chest pain., Disp: 25 tablet, Rfl: 6 olmesartan-hydrochlorothiazide (BENICAR HCT) 20-12.5 MG per tablet, Take 1 tablet by mouth daily., Disp: 30 tablet, Rfl: 2 predniSONE (DELTASONE) 10 MG tablet, Take 4 tabs daily x 2 days, 3 tabs daily x 2 days, 2 tabs daily x 2 days, 1 tab daily x 2 days then stop, Disp: 20 tablet, Rfl: 0 rosuvastatin (CRESTOR) 10 MG tablet, Take 10 mg by mouth daily., Disp: , Rfl:  sertraline (ZOLOFT) 50 MG tablet, Take 50 mg by mouth daily.  , Disp: , Rfl:  warfarin (COUMADIN) 2 MG tablet, Take 2 mg by mouth daily. As directed , Disp: , Rfl:  EPINEPHrine (EPI-PEN) 0.3 mg/0.3 mL  SOAJ injection, Inject 0.3 mLs (0.3 mg total) into the muscle once., Disp: 1 Device, Rfl: 1  No current facility-administered medications on file prior to visit.   Past Medical History:   PAF (paroxysmal atrial fibrillation)                         Polyp of nasal cavity                                        Allergic rhinitis, cause unspecified                         GERD (gastroesophageal reflux disease)                       Hypothyroidism                                               Hyperlipidemia                                               History of TIAs  Chronic anticoagulation                                      Obesity                                                      Normal nuclear stress test                      March 2013     Comment:EF 64%, clinically and electrically negative               for ischemia. Myoview scan with apical               thinning, could not exclude small               subendocardial scar. No ischemia.   Past Surgical History:   NASAL SINUS SURGERY                                           TOTAL ABDOMINAL HYSTERECTOMY W/ BILATERAL SALP*               CARPAL TUNNEL RELEASE                                         US ECHOCARDIOGRAPHY                              01/23/2010      Comment:EF 55-60%  Review of patient's family history indicates:   Heart disease                  Mother                   Heart disease                  Father                   Heart attack                   Father                   Social History   Marital Status: Married             Spouse Name:                      Years of Education:                 Number of children:             Occupational History Occupation          Associate Professor            Comment              Retired  Social History Main Topics   Smoking Status: Never Smoker                     Smokeless Status: Never Used                        Alcohol Use: No             Drug Use: No             Sexual Activity: No                 Other Topics            Concern   None on file  Social History Narrative   None on file    ROS: Knee pain secondary to torn meniscus, See history of present illness otherwise negative   PHYSICAL EXAM: Well-nournished, in no acute distress. Neck: No JVD, HJR, Bruit, or thyroid enlargement  Lungs: No tachypnea, clear without wheezing, rales, or rhonchi  Cardiovascular: RRR, PMI not displaced, positive S4, no murmur, bruit, thrill, or heave.  Abdomen: BS normal. Soft without organomegaly, masses, lesions or tenderness.  Extremities: without cyanosis, clubbing or edema. Good distal pulses bilateral  SKin: Warm, no lesions or rashes   Musculoskeletal: No deformities  Neuro: no focal signs  BP 158/82  Pulse 56  Ht 5\' 4"  (1.626 m)  Wt 188 lb (85.276 kg)  BMI 32.25 kg/m2    EKG: Sinus bradycardia 56 beats per minute nonspecific ST-T wave changes, no acute change

## 2014-03-16 ENCOUNTER — Ambulatory Visit (INDEPENDENT_AMBULATORY_CARE_PROVIDER_SITE_OTHER): Payer: Medicare Other

## 2014-03-16 DIAGNOSIS — J309 Allergic rhinitis, unspecified: Secondary | ICD-10-CM

## 2014-03-28 ENCOUNTER — Ambulatory Visit (HOSPITAL_COMMUNITY): Payer: Medicare Other | Attending: Cardiology | Admitting: Radiology

## 2014-03-28 ENCOUNTER — Other Ambulatory Visit (INDEPENDENT_AMBULATORY_CARE_PROVIDER_SITE_OTHER): Payer: Medicare Other

## 2014-03-28 ENCOUNTER — Ambulatory Visit (INDEPENDENT_AMBULATORY_CARE_PROVIDER_SITE_OTHER): Payer: Medicare Other

## 2014-03-28 VITALS — BP 152/77 | HR 51 | Ht 64.0 in | Wt 188.0 lb

## 2014-03-28 DIAGNOSIS — N289 Disorder of kidney and ureter, unspecified: Secondary | ICD-10-CM

## 2014-03-28 DIAGNOSIS — R0602 Shortness of breath: Secondary | ICD-10-CM

## 2014-03-28 DIAGNOSIS — R0989 Other specified symptoms and signs involving the circulatory and respiratory systems: Secondary | ICD-10-CM | POA: Insufficient documentation

## 2014-03-28 DIAGNOSIS — R0789 Other chest pain: Secondary | ICD-10-CM | POA: Insufficient documentation

## 2014-03-28 DIAGNOSIS — R0609 Other forms of dyspnea: Secondary | ICD-10-CM | POA: Insufficient documentation

## 2014-03-28 DIAGNOSIS — R0600 Dyspnea, unspecified: Secondary | ICD-10-CM

## 2014-03-28 DIAGNOSIS — R079 Chest pain, unspecified: Secondary | ICD-10-CM

## 2014-03-28 DIAGNOSIS — J309 Allergic rhinitis, unspecified: Secondary | ICD-10-CM

## 2014-03-28 DIAGNOSIS — R002 Palpitations: Secondary | ICD-10-CM | POA: Insufficient documentation

## 2014-03-28 LAB — BASIC METABOLIC PANEL
BUN: 19 mg/dL (ref 6–23)
CHLORIDE: 104 meq/L (ref 96–112)
CO2: 28 meq/L (ref 19–32)
Calcium: 9 mg/dL (ref 8.4–10.5)
Creatinine, Ser: 0.7 mg/dL (ref 0.4–1.2)
GFR: 82.39 mL/min (ref >60.00)
GLUCOSE: 116 mg/dL — AB (ref 70–99)
POTASSIUM: 3.1 meq/L — AB (ref 3.5–5.1)
Sodium: 141 mEq/L (ref 135–145)

## 2014-03-28 MED ORDER — TECHNETIUM TC 99M SESTAMIBI GENERIC - CARDIOLITE
30.0000 | Freq: Once | INTRAVENOUS | Status: AC | PRN
  Administered 2014-03-28: 30 via INTRAVENOUS

## 2014-03-28 MED ORDER — REGADENOSON 0.4 MG/5ML IV SOLN
0.4000 mg | Freq: Once | INTRAVENOUS | Status: AC
  Administered 2014-03-28: 0.4 mg via INTRAVENOUS

## 2014-03-28 MED ORDER — TECHNETIUM TC 99M SESTAMIBI GENERIC - CARDIOLITE
10.0000 | Freq: Once | INTRAVENOUS | Status: AC | PRN
  Administered 2014-03-28: 10 via INTRAVENOUS

## 2014-03-28 NOTE — Progress Notes (Signed)
[facility] SITE 3 NUCLEAR MED [address] [place], [place] [address] [phone]    Cardiology Nuclear Med Study  Carollee MassedFrances C Morris is a 78 y.o. female     MRN : [id number]     DOB: 07/26/1936  Procedure Date: 03/28/2014  Nuclear Med Background Indication for Stress Test:  Evaluation for Ischemia History:  No known CAD, PAF, MPI 2013 (scar) EF 64% Cardiac Risk Factors: Family History - CAD, Hypertension and Lipids  Symptoms:  Chest Pressure, DOE and Palpitations   Nuclear Pre-Procedure Caffeine/Decaff Intake:  None NPO After: 5 pm   Lungs:  clear O2 Sat: 95% on room air. IV 0.9% NS with Angio Cath:  22g  IV Site: R Hand  IV Started by:  Bonnita LevanJackie Smith, RN  Chest Size (in):  38 Cup Size: DD  Height: 5\' 4"  (1.626 m)  Weight:  188 lb (85.276 kg)  BMI:  Body mass index is 32.25 kg/(m^2). Tech Comments:  N/A    Nuclear Med Study 1 or 2 day study: 1 day  Stress Test Type:  Lexiscan  Reading MD: N/A  Order Authorizing Anyiah Coverdale:  Peter SwazilandJordan, MD  Resting Radionuclide: Technetium 405m Sestamibi  Resting Radionuclide Dose: 11.0 mCi   Stress Radionuclide:  Technetium 395m Sestamibi  Stress Radionuclide Dose: 33.0 mCi           Stress Protocol Rest HR: 51 Stress HR: 75  Rest BP: 152/77 Stress BP: 132/77  Exercise Time (min): n/a METS: n/a           Dose of Adenosine (mg):  n/a Dose of Lexiscan: 0.4 mg  Dose of Atropine (mg): n/a Dose of Dobutamine: n/a mcg/kg/min (at max HR)  Stress Test Technologist: Nelson ChimesSharon Brooks, BS-ES  Nuclear Technologist:  Doyne Keelonya Yount, CNMT     Rest Procedure:  Myocardial perfusion imaging was performed at rest 45 minutes following the intravenous administration of Technetium 605m Sestamibi. Rest ECG: NSR - Normal EKG  Stress Procedure:  The patient received IV Lexiscan 0.4 mg over 15-seconds.  Technetium 335m Sestamibi injected at 30-seconds.  Quantitative spect images were obtained after a 45 minute delay.  During the infusion of Lexiscan  the patient had a cough, complained of chest discomfort and fatigue.  These symptoms began to resolve in recovery.  Stress ECG: No significant change from baseline ECG  QPS Raw Data Images:  There is a breast shadow that accounts for the anterior attenuation. Stress Images:  Fixed apical and lateral defects Rest Images:  Fixed apical and lateral defects Subtraction (SDS):  No evidence of ischemia. Transient Ischemic Dilatation (Normal <1.22):  1.06 Lung/Heart Ratio (Normal <0.45):  0.31  Quantitative Gated Spect Images QGS EDV:  107 ml QGS ESV:  41 ml  Impression Exercise Capacity:  Lexiscan with no exercise. BP Response:  Normal blood pressure response. Clinical Symptoms:  No significant symptoms noted. ECG Impression:  No significant ECG changes with Lexiscan. Comparison with Prior Nuclear Study: Prior study in 2013, suggestive of scar ?territory, EF 64%  Overall Impression:  Low risk stress nuclear study with apical/lateral breast attenuation artifacts.  LV Ejection Fraction: 62%.  LV Wall Motion:  NL LV Function; NL Wall Motion  Chrystie NoseKenneth C. Hilty, MD, [facility] HospitalFACC Board Certified in Nuclear Cardiology Attending Cardiologist [facility]

## 2014-03-30 ENCOUNTER — Other Ambulatory Visit: Payer: Self-pay

## 2014-03-30 DIAGNOSIS — E876 Hypokalemia: Secondary | ICD-10-CM

## 2014-03-30 MED ORDER — POTASSIUM CHLORIDE CRYS ER 20 MEQ PO TBCR: 20.0000 meq | EXTENDED_RELEASE_TABLET | Freq: Every day | ORAL | Status: DC

## 2014-04-02 ENCOUNTER — Ambulatory Visit (HOSPITAL_COMMUNITY): Payer: Medicare Other | Attending: Physician Assistant | Admitting: Radiology

## 2014-04-02 DIAGNOSIS — I10 Essential (primary) hypertension: Secondary | ICD-10-CM

## 2014-04-02 DIAGNOSIS — R0989 Other specified symptoms and signs involving the circulatory and respiratory systems: Secondary | ICD-10-CM | POA: Insufficient documentation

## 2014-04-02 DIAGNOSIS — I517 Cardiomegaly: Secondary | ICD-10-CM | POA: Insufficient documentation

## 2014-04-02 DIAGNOSIS — Z8673 Personal history of transient ischemic attack (TIA), and cerebral infarction without residual deficits: Secondary | ICD-10-CM | POA: Insufficient documentation

## 2014-04-02 DIAGNOSIS — R0609 Other forms of dyspnea: Secondary | ICD-10-CM | POA: Insufficient documentation

## 2014-04-02 DIAGNOSIS — I519 Heart disease, unspecified: Secondary | ICD-10-CM

## 2014-04-02 DIAGNOSIS — R0602 Shortness of breath: Secondary | ICD-10-CM

## 2014-04-02 DIAGNOSIS — E669 Obesity, unspecified: Secondary | ICD-10-CM | POA: Insufficient documentation

## 2014-04-02 DIAGNOSIS — E785 Hyperlipidemia, unspecified: Secondary | ICD-10-CM | POA: Insufficient documentation

## 2014-04-02 DIAGNOSIS — I059 Rheumatic mitral valve disease, unspecified: Secondary | ICD-10-CM | POA: Insufficient documentation

## 2014-04-02 DIAGNOSIS — I079 Rheumatic tricuspid valve disease, unspecified: Secondary | ICD-10-CM | POA: Insufficient documentation

## 2014-04-02 DIAGNOSIS — I5189 Other ill-defined heart diseases: Secondary | ICD-10-CM

## 2014-04-02 DIAGNOSIS — I4891 Unspecified atrial fibrillation: Secondary | ICD-10-CM | POA: Insufficient documentation

## 2014-04-02 NOTE — Progress Notes (Signed)
Echocardiogram performed.  

## 2014-04-03 ENCOUNTER — Ambulatory Visit (INDEPENDENT_AMBULATORY_CARE_PROVIDER_SITE_OTHER): Payer: Medicare Other

## 2014-04-03 DIAGNOSIS — J309 Allergic rhinitis, unspecified: Secondary | ICD-10-CM

## 2014-04-04 ENCOUNTER — Ambulatory Visit: Payer: Medicare Other

## 2014-04-04 ENCOUNTER — Telehealth: Payer: Self-pay | Admitting: Cardiology

## 2014-04-04 NOTE — Telephone Encounter (Signed)
Returned call to patient she stated she was returning Kim's call about echo.Patient was told 04/02/14 echo normal.

## 2014-04-04 NOTE — Telephone Encounter (Signed)
New problem ° ° °Pt returning a call.  °

## 2014-04-13 ENCOUNTER — Ambulatory Visit (INDEPENDENT_AMBULATORY_CARE_PROVIDER_SITE_OTHER): Payer: Medicare Other

## 2014-04-13 DIAGNOSIS — J309 Allergic rhinitis, unspecified: Secondary | ICD-10-CM

## 2014-04-18 IMAGING — CR DG CHEST 2V
2 series · 2 of 2 positions shown · non-contrast
Comparison: 08/31/2011.

CLINICAL DATA: Cough.

CHEST - 2 VIEW

[view not recorded (1 of 2)]
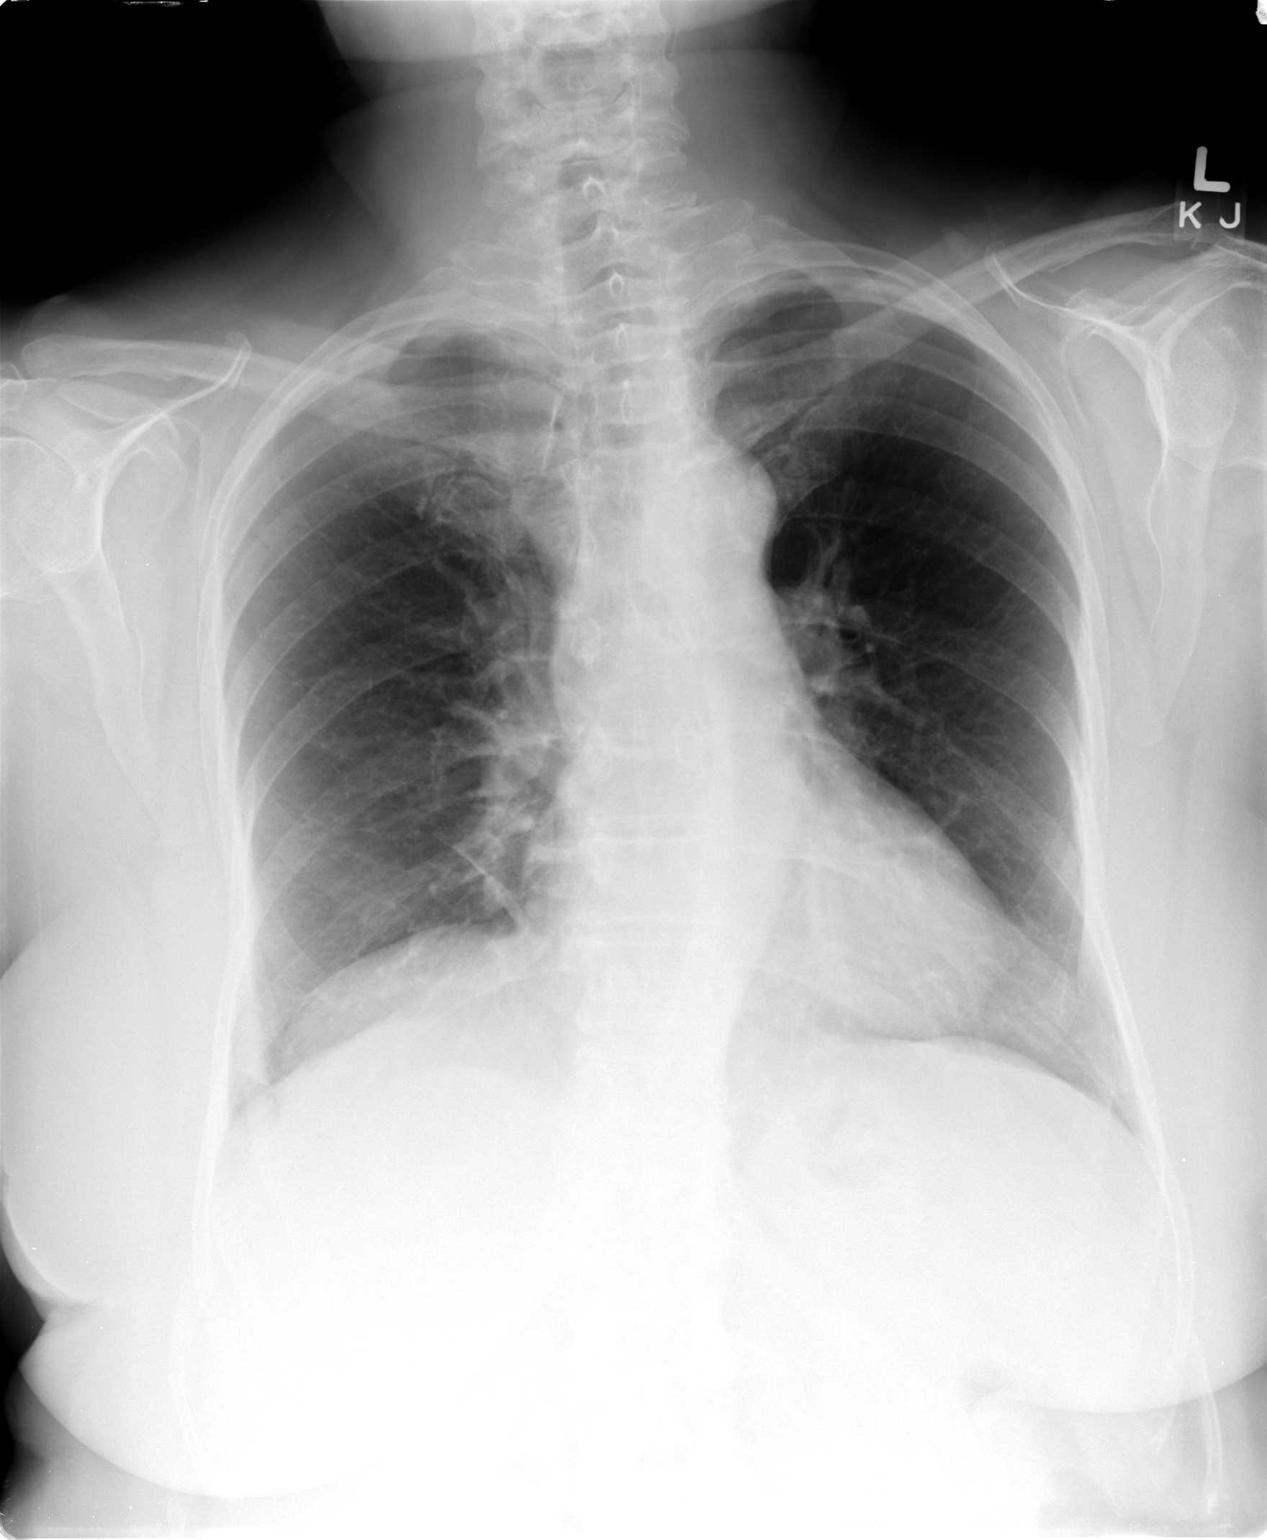

[view not recorded (2 of 2)]
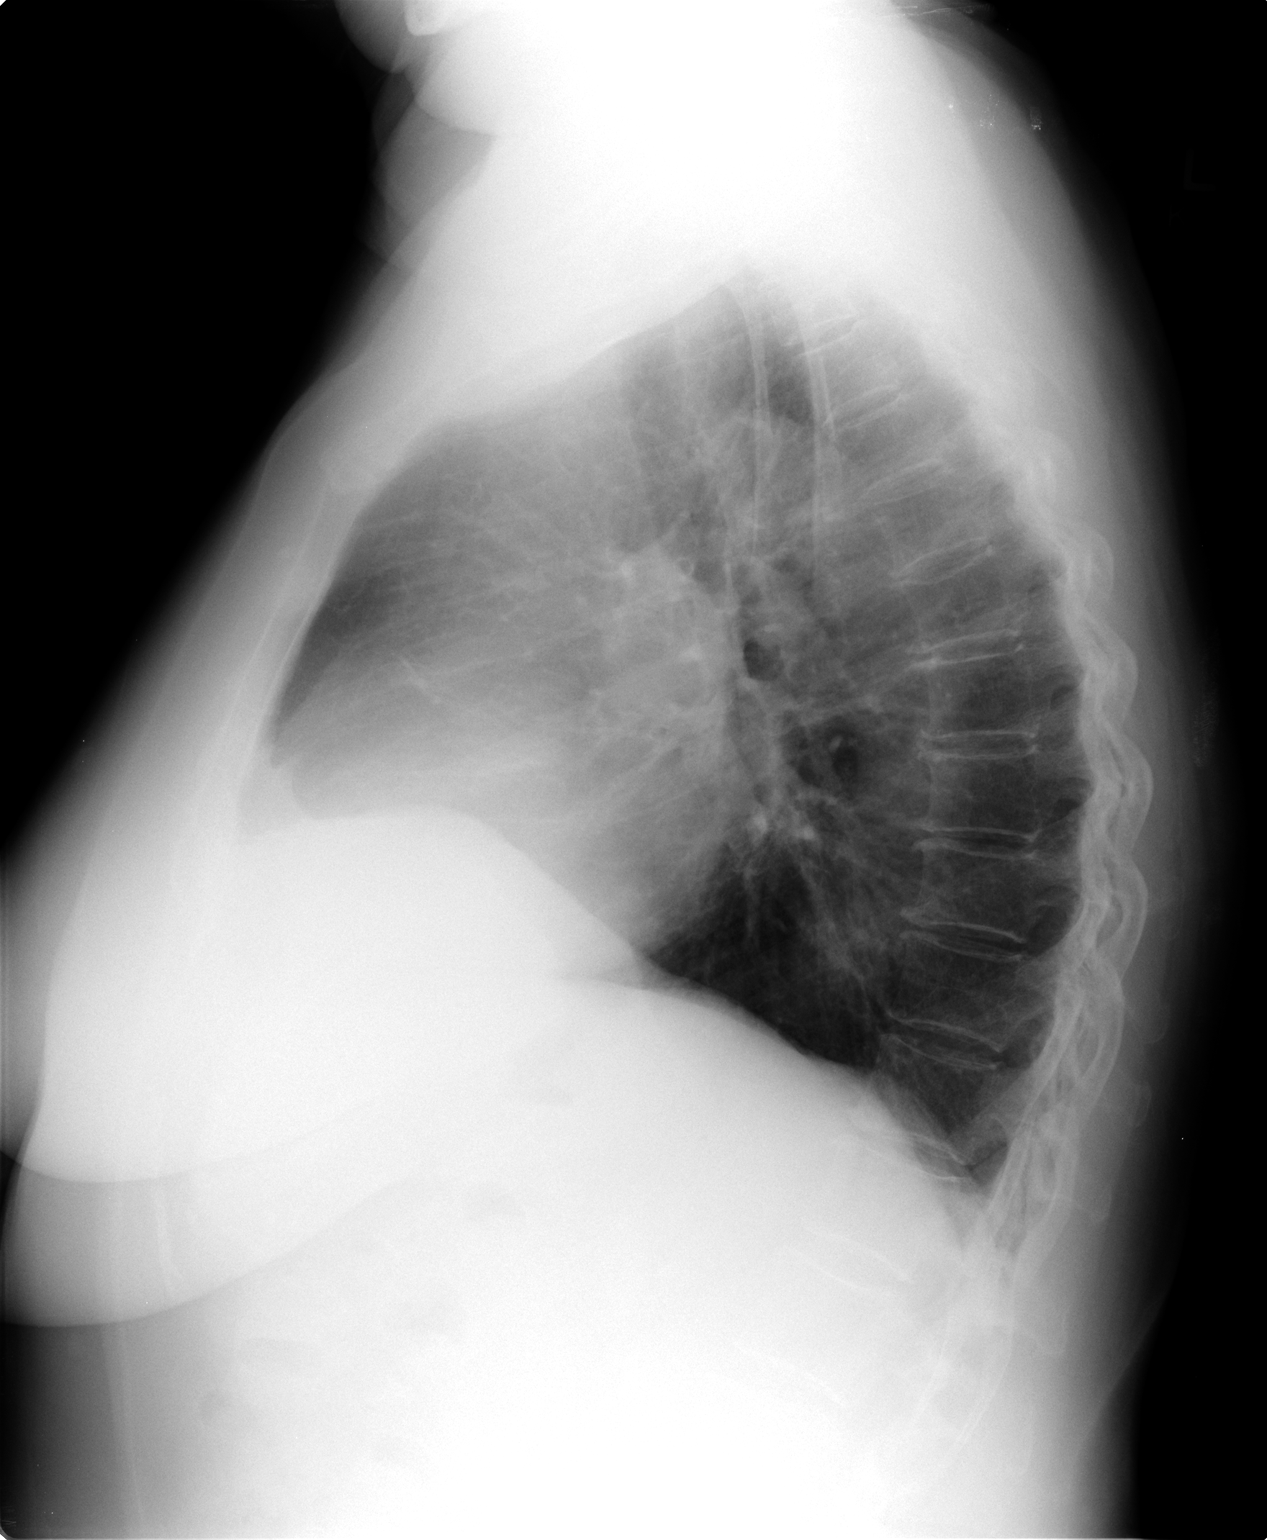

[2 of 2 positions shown; findings below may reference images not displayed]

FINDINGS: Trachea is midline.  Heart size is within normal limits.
Biapical pleural thickening.  Scattered bibasilar scarring.  No
pleural fluid.
IMPRESSION: No acute findings appear

## 2014-04-19 ENCOUNTER — Ambulatory Visit (INDEPENDENT_AMBULATORY_CARE_PROVIDER_SITE_OTHER): Payer: Medicare Other

## 2014-04-19 ENCOUNTER — Other Ambulatory Visit (INDEPENDENT_AMBULATORY_CARE_PROVIDER_SITE_OTHER): Payer: Medicare Other

## 2014-04-19 DIAGNOSIS — J309 Allergic rhinitis, unspecified: Secondary | ICD-10-CM

## 2014-04-19 DIAGNOSIS — E876 Hypokalemia: Secondary | ICD-10-CM

## 2014-04-19 LAB — BASIC METABOLIC PANEL
BUN: 15 mg/dL (ref 6–23)
CALCIUM: 9 mg/dL (ref 8.4–10.5)
CO2: 28 meq/L (ref 19–32)
CREATININE: 0.7 mg/dL (ref 0.4–1.2)
Chloride: 105 mEq/L (ref 96–112)
GFR: 81.09 mL/min (ref >60.00)
GLUCOSE: 152 mg/dL — AB (ref 70–99)
Potassium: 3.2 mEq/L — ABNORMAL LOW (ref 3.5–5.1)
Sodium: 139 mEq/L (ref 135–145)

## 2014-04-20 ENCOUNTER — Other Ambulatory Visit: Payer: Self-pay

## 2014-04-20 ENCOUNTER — Other Ambulatory Visit: Payer: Medicare Other

## 2014-04-27 ENCOUNTER — Ambulatory Visit (INDEPENDENT_AMBULATORY_CARE_PROVIDER_SITE_OTHER): Payer: Medicare Other

## 2014-04-27 DIAGNOSIS — J309 Allergic rhinitis, unspecified: Secondary | ICD-10-CM

## 2014-05-01 ENCOUNTER — Ambulatory Visit (INDEPENDENT_AMBULATORY_CARE_PROVIDER_SITE_OTHER): Payer: Medicare Other | Admitting: Cardiology

## 2014-05-01 ENCOUNTER — Encounter: Payer: Self-pay | Admitting: Cardiology

## 2014-05-01 VITALS — BP 130/72 | HR 68 | Ht 64.0 in | Wt 192.4 lb

## 2014-05-01 DIAGNOSIS — I4891 Unspecified atrial fibrillation: Secondary | ICD-10-CM

## 2014-05-01 DIAGNOSIS — I10 Essential (primary) hypertension: Secondary | ICD-10-CM

## 2014-05-01 DIAGNOSIS — R0602 Shortness of breath: Secondary | ICD-10-CM

## 2014-05-01 DIAGNOSIS — I480 Paroxysmal atrial fibrillation: Secondary | ICD-10-CM

## 2014-05-01 NOTE — Progress Notes (Signed)
Vicki Morris Date of Birth: Jul 08, 1936 Medical Record #[id number]  History of Present Illness: Vicki Morris is seen for a followup visit. She has PAF, GERD, hypothyroidism, HTN, HLD and a past TIA. She is on chronic coumadin. She was seen by Erroll Luna PA-C in June after an episode of chest pain and elevated BP. Benicar HCT was increased. A Nuclear stress test and Echo were done and are noted below. She has felt well since then without recurrent chest pain or SOB. Feels well. 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   Current Outpatient Prescriptions on File Prior to Visit  Medication Sig Dispense Refill  . diltiazem (CARDIZEM CD) 240 MG 24 hr capsule Take 1 capsule by mouth daily.      Marland Kitchen EPINEPHrine (EPI-PEN) 0.3 mg/0.3 mL SOAJ injection Inject 0.3 mLs (0.3 mg total) into the muscle once.  1 Device  1  . levothyroxine (SYNTHROID, LEVOTHROID) 100 MCG tablet Take 100 mcg by mouth daily.        Marland Kitchen LORazepam (ATIVAN) 0.5 MG tablet Take 0.5 mg by mouth every 8 (eight) hours as needed.      . nitroGLYCERIN (NITROSTAT) 0.4 MG SL tablet Place 1 tablet (0.4 mg total) under the tongue every 5 (five) minutes as needed for chest pain.  25 tablet  6  . olmesartan-hydrochlorothiazide (BENICAR HCT) 40-25 MG per tablet Take 1 tablet by mouth daily.  30 tablet  3  . potassium chloride SA (K-DUR,KLOR-CON) 20 MEQ tablet Take 20 meq twice a day  60 tablet  6  . rosuvastatin (CRESTOR) 10 MG tablet Take 10 mg by mouth daily.      . sertraline (ZOLOFT) 50 MG tablet Take 50 mg by mouth daily.        Marland Kitchen warfarin (COUMADIN) 2 MG tablet Take 2 mg by mouth daily. As directed        No current facility-administered medications on file prior to visit.    Allergies  Allergen Reactions  . Other Anaphylaxis    Red meat, Pork, Beef, Lamb  . Clarithromycin   . Nabumetone   . Sulfonamide Derivatives     Past Medical History  Diagnosis Date  . PAF (paroxysmal  atrial fibrillation)   . Polyp of nasal cavity   . Allergic rhinitis, cause unspecified   . GERD (gastroesophageal reflux disease)   . Hypothyroidism   . Hyperlipidemia   . History of TIAs   . Chronic anticoagulation   . Obesity   . Normal nuclear stress test March 2013    EF 64%, clinically and electrically negative for ischemia. Myoview scan with apical thinning, could not exclude small subendocardial scar. No ischemia.     Past Surgical History  Procedure Laterality Date  . Nasal sinus surgery    . Total abdominal hysterectomy w/ bilateral salpingoophorectomy    . Carpal tunnel release    . US echocardiography  01/23/2010    EF 55-60%    History  Smoking status  . Never Smoker   Smokeless tobacco  . Never Used    History  Alcohol Use No    Family History  Problem Relation Age of Onset  . Heart disease Mother   . Heart disease Father   . Heart attack Father     Review of Systems: The review of systems is per the HPI.  All other systems were reviewed and are negative.  Physical Exam: BP 130/72  Pulse 68  Ht 5\' 4"  (1.626 m)  Wt 192 lb 6.4 oz (87.272 kg)  BMI 33.01 kg/m2 Patient is very pleasant and in no acute distress. Skin is warm and dry. Color is normal.  HEENT is unremarkable. Normocephalic/atraumatic. PERRL. Sclera are nonicteric. Neck is supple. No masses. No JVD. Lungs are clear. Cardiac exam shows a regular rate and rhythm. Abdomen is soft. Extremities are without edema. Gait and ROM are intact. No gross neurologic deficits noted.  Lab data:   Echo: Study Conclusions  - Left ventricle: The cavity size was normal. Wall thickness was normal. Systolic function was normal. The estimated ejection fraction was in the range of 55% to 60%. Wall motion was normal; there were no regional wall motion abnormalities. - Left atrium: The atrium was mildly dilated.  Myoview:Cardiology Nuclear Med Study  Vicki MassedFrances C Morris is a 78 y.o. female MRN : [id number] DOB:  11/13/1935  Procedure Date: 03/28/2014  Nuclear Med Background  Indication for Stress Test: Evaluation for Ischemia  History: No known CAD, PAF, MPI 2013 (scar) EF 64%  Cardiac Risk Factors: Family History - CAD, Hypertension and Lipids  Symptoms: Chest Pressure, DOE and Palpitations  Nuclear Pre-Procedure  Caffeine/Decaff Intake: None  NPO After: 5 pm   Lungs: clear  O2 Sat: 95% on room air.  IV 0.9% NS with Angio Cath: 22g   IV Site: R Hand  IV Started by: Bonnita LevanJackie Smith, RN   Chest Size (in): 38  Cup Size: DD   Height: 5\' 4"  (1.626 m)  Weight: 188 lb (85.276 kg)   BMI: Body mass index is 32.25 kg/(m^2).  Tech Comments: N/A   Nuclear Med Study  1 or 2 day study: 1 day  Stress Test Type: Lexiscan   Reading MD: N/A  Order Authorizing Provider: Evetta Renner [place]az[place], MD   Resting Radionuclide: Technetium 4240m Sestamibi  Resting Radionuclide Dose: 11.0 mCi   Stress Radionuclide: Technetium 6640m Sestamibi  Stress Radionuclide Dose: 33.0 mCi   Stress Protocol  Rest HR: 51  Stress HR: 75   Rest BP: 152/77  Stress BP: 132/77   Exercise Time (min): n/a  METS: n/a    Dose of Adenosine (mg): n/a  Dose of Lexiscan: 0.4 mg   Dose of Atropine (mg): n/a  Dose of Dobutamine: n/a mcg/kg/min (at max HR)   Stress Test Technologist: Nelson ChimesSharon Brooks, BS-ES  Nuclear Technologist: Doyne Keelonya Yount, CNMT   Rest Procedure: Myocardial perfusion imaging was performed at rest 45 minutes following the intravenous administration of Technetium 4640m Sestamibi.  Rest ECG: NSR - Normal EKG  Stress Procedure: The patient received IV Lexiscan 0.4 mg over 15-seconds. Technetium 1240m Sestamibi injected at 30-seconds. Quantitative spect images were obtained after a 45 minute delay. During the infusion of Lexiscan the patient had a cough, complained of chest discomfort and fatigue. These symptoms began to resolve in recovery.  Stress ECG: No significant change from baseline ECG  QPS  Raw Data Images: There is a breast shadow that accounts for  the anterior attenuation.  Stress Images: Fixed apical and lateral defects  Rest Images: Fixed apical and lateral defects  Subtraction (SDS): No evidence of ischemia.  Transient Ischemic Dilatation (Normal <1.22): 1.06  Lung/Heart Ratio (Normal <0.45): 0.31  Quantitative Gated Spect Images  QGS EDV: 107 ml  QGS ESV: 41 ml  Impression  Exercise Capacity: Lexiscan with no exercise.  BP Response: Normal blood pressure response.  Clinical Symptoms: No significant symptoms noted.  ECG Impression: No significant ECG changes with  Lexiscan.  Comparison with Prior Nuclear Study: Prior study in 2013, suggestive of scar ?territory, EF 64%  Overall Impression: Low risk stress nuclear study with apical/lateral breast attenuation artifacts.  LV Ejection Fraction: 62%. LV Wall Motion: NL LV Function; NL Wall Motion  Chrystie Nose, MD, [facility]  Board Certified in Nuclear Cardiology  Attending Cardiologist  [facility]    Assessment / Plan: 1. Chest pain- resolved. Cardiac work up unremarkable. No ischemia. Normal LV function. Continue medical therapy  2. HTN- now well controlled with increase Benicar HCT. Will follow up BMET  3. Atrial fibrillation. In NSR. Continue coumadin.

## 2014-05-01 NOTE — Patient Instructions (Signed)
Continue your current therapy  I will see you in 6 months.   

## 2014-05-04 ENCOUNTER — Encounter: Payer: Self-pay | Admitting: Internal Medicine

## 2014-05-04 ENCOUNTER — Ambulatory Visit (INDEPENDENT_AMBULATORY_CARE_PROVIDER_SITE_OTHER): Payer: Medicare Other | Admitting: Internal Medicine

## 2014-05-04 ENCOUNTER — Ambulatory Visit (INDEPENDENT_AMBULATORY_CARE_PROVIDER_SITE_OTHER): Payer: Medicare Other

## 2014-05-04 VITALS — BP 120/74 | HR 59 | Ht 64.0 in | Wt 189.0 lb

## 2014-05-04 DIAGNOSIS — J3089 Other allergic rhinitis: Principal | ICD-10-CM

## 2014-05-04 DIAGNOSIS — J309 Allergic rhinitis, unspecified: Secondary | ICD-10-CM

## 2014-05-04 DIAGNOSIS — Z91018 Allergy to other foods: Secondary | ICD-10-CM

## 2014-05-04 DIAGNOSIS — J302 Other seasonal allergic rhinitis: Secondary | ICD-10-CM

## 2014-05-04 LAB — BASIC METABOLIC PANEL
BUN: 17 mg/dL (ref 6–23)
CO2: 29 mEq/L (ref 19–32)
CREATININE: 0.79 mg/dL (ref 0.50–1.10)
Calcium: 9.5 mg/dL (ref 8.4–10.5)
Chloride: 105 mEq/L (ref 96–112)
Glucose, Bld: 88 mg/dL (ref 70–99)
Potassium: 4 mEq/L (ref 3.5–5.3)
Sodium: 141 mEq/L (ref 135–145)

## 2014-05-04 NOTE — Patient Instructions (Signed)
We can continue allergy vaccine 1:10 GH  For the stuffy nose at night, try using otc Breathe Right nasal strips to hold your nose open  For the dry mouth in the morning, try rinsing your mouth with otc Biotene to help your mouth feel more comfortable

## 2014-05-04 NOTE — Progress Notes (Signed)
Subjective:    Patient ID: Vicki Morris, female    DOB: 04/03/1936, 78 y.o.   MRN: [id number]  HPI 78 yo F followed for allergic rhinitis and dyspnea, complicated by GERD, atrial fib.  Last here November 20, 2010. She wheezed a little last night after spreading mulch yesterday, but otherwise has done quite well considering the pollen season. She continues allergy vaccine here at 1:10. We discussed her vaccine, and meds.  05/03/12- 78 yo F followed for allergic rhinitis and dyspnea, complicated by GERD, atrial fib.  Widowed since last here- husband had metastatic lung cancer.  Cough in throat(tickle) gets worse at night and causes wheezing, Pt  is still getting allergy vaccine 1:10 and doing well on it. Ongoing reflux symptoms despite Prilosec. On an ACE inhibitor-we discussed potential for this to be the cause of her dry cough.  05/03/13- 78 yo F followed for allergic rhinitis and dyspnea, complicated by GERD, atrial fib FOLLOWS FOR: still on allergy vaccine 1:10 GH and doing well; Denies any flare ups at this time. Mild dry cough persists. CXR Neg last year. She and her PCP office realize this is likely from benazepril, but she doesn't mind it enough to change med.  Fewer sinus problems since husband (smoker) died. CXR 05/04/12 IMPRESSION:  No acute findings appear  Original Report Authenticated By: Reyes IvanMELINDA A. BLIETZ, M.D.  06/21/13- 78 yo F followed for allergic rhinitis and dyspnea, complicated by GERD, atrial fib ACUTE VISIT: had eaten chedder cheese stick and lil debbie oatmeal cookie Sunday morning; lunch was hot dog with slaw, chili, and mustard; snack was cinnamon roll(homemade); Went to sleep and about an hour later woke with swollen/cracked lips and tongue swelling. [facility] and prednisone.  Pt did pick up Epipen on Friday and did not use. On Benazepril HCT- discussed ACE inhibitors again. She she had 4 episodes: 2 months ago and 6 weeks ago the inside of her  mouth got sore. One week ago she woke with swollen tongue, right greater than left side. On another occasion she awoke from nap mouth sore and lips feeling swollen. Went to ER where she was treated with prednisone 40 mg daily. She questions if trigger might be cereal. No other rash or wheeze. Today feels well except tongue "tingle".  07/31/13- 78 yo F followed for allergic rhinitis and dyspnea, complicated by GERD, atrial fib FOLLOWS FOR: sitll on vaccine and doing well; would like to discuss labs and food allergies in detail. No longer dry cough or tongue swelling since switching from benazepril to Benicar. Continues allergy vaccine 1:10 GH. Feels well. Food IgE allergy profile 06/21/2013-Negative, total IgE 3.3. "Alpha-Gal" galactose-alpha-1, 3-galactose IgE elevated 1.14, significance unclear. Watch for reaction to meat. She had a total of 4 episodes of waking with tongue swelling. None recent. Has EpiPen and Benadryl. Discussed reaction to meat, preservatives in hot dogs?  01/30/14- 78 yo F followed for allergic rhinitis, angioedema and dyspnea, complicated by GERD, atrial fib FOLLOWS FOR:  Allergy Vaccine 1:10 GH doing well.  Allergies doing well some pressure in forehead area. We had called antibiotic and cleared her sinus infection since last here.Minor stuffiness lying down. Tongue has swollen again- most recently after grilled chicken cooked on same grill w/ beef- used Epipen. Can eat fish and chicken, consistent with elevated Alpha-Gal IgE documented last visit.  05/04/14- 78 yo F followed for allergic rhinitis, food allergy-meat, angioedema and dyspnea, complicated by GERD, atrial fib FOLLOWS FOR: still on allergy vaccine  1:10 GH and doing well. Alpha-Gal 06/2013 was positive indicating potential IgE allergy to mammalian meat. She finds she can eat fish or chicken cooked on aluminum foil restaurant to avoid contact with other meats on the grill. Dry mouth and stuffy nose at  night.  ROS-see HPI Constitutional:   No-   weight loss, night sweats, fevers, chills, fatigue, lassitude. HEENT:   No-  headaches, difficulty swallowing, tooth/dental problems, sore throat,       No-  sneezing, itching, ear ache, +nasal congestion, post nasal drip,  CV:  No-   chest pain, orthopnea, PND, swelling in lower extremities, anasarca, dizziness, palpitations Resp: No-   shortness of breath with exertion or at rest.              No-   productive cough,  No-non-productive cough,  No- coughing up of blood.              No-   change in color of mucus.  No- wheezing.   Skin: No-   rash or lesions. GI:  No-   heartburn, indigestion, abdominal pain, nausea, vomiting,  GU: . MS:  No-   joint pain or swelling.   Neuro-     nothing unusual Psych:  No- change in mood or affect. no-  depression or anxiety.  No memory loss. no OBJ- Physical Exam General- Alert, Oriented, Affect-appropriate, Distress- none acute Skin- rash-none, lesions- none, excoriation- none Lymphadenopathy- none Head- atraumatic            Eyes- Gross vision intact, PERRLA, conjunctivae and secretions clear            Ears- Hearing, canals-normal            Nose- Clear, no-Septal dev, mucus, polyps, erosion, perforation             Throat- Mallampati II , mucosa clear + dry, drainage- none, tonsils- atrophic Neck- flexible , trachea midline, no stridor , thyroid nl, carotid no bruit Chest - symmetrical excursion , unlabored           Heart/CV- +almost regular AFib , no murmur , no gallop  , no rub, nl s1 s2                           - JVD- none , edema+ trace, stasis changes- none, varices- none           Lung- clear to P&A, wheeze- none, cough- none , dullness-none, rub- none           Chest wall-  Abd-  Br/ Gen/ Rectal- Not done, not indicated Extrem- cyanosis- none, clubbing, none, atrophy- none, strength- nl Neuro- grossly intact to observation  Assessment & Plan:

## 2014-05-10 ENCOUNTER — Ambulatory Visit (INDEPENDENT_AMBULATORY_CARE_PROVIDER_SITE_OTHER): Payer: Medicare Other

## 2014-05-10 DIAGNOSIS — J309 Allergic rhinitis, unspecified: Secondary | ICD-10-CM

## 2014-05-11 ENCOUNTER — Ambulatory Visit: Payer: Medicare Other

## 2014-05-14 ENCOUNTER — Other Ambulatory Visit: Payer: Self-pay

## 2014-05-14 MED ORDER — POTASSIUM CHLORIDE CRYS ER 20 MEQ PO TBCR: EXTENDED_RELEASE_TABLET | ORAL | Status: DC

## 2014-05-18 ENCOUNTER — Ambulatory Visit (INDEPENDENT_AMBULATORY_CARE_PROVIDER_SITE_OTHER): Payer: Medicare Other

## 2014-05-18 DIAGNOSIS — J309 Allergic rhinitis, unspecified: Secondary | ICD-10-CM

## 2014-05-25 ENCOUNTER — Ambulatory Visit (INDEPENDENT_AMBULATORY_CARE_PROVIDER_SITE_OTHER): Payer: Medicare Other

## 2014-05-25 DIAGNOSIS — J309 Allergic rhinitis, unspecified: Secondary | ICD-10-CM

## 2014-06-01 ENCOUNTER — Ambulatory Visit (INDEPENDENT_AMBULATORY_CARE_PROVIDER_SITE_OTHER): Payer: Medicare Other

## 2014-06-01 DIAGNOSIS — J309 Allergic rhinitis, unspecified: Secondary | ICD-10-CM

## 2014-06-06 ENCOUNTER — Encounter: Payer: Self-pay | Admitting: Internal Medicine

## 2014-06-08 ENCOUNTER — Ambulatory Visit (INDEPENDENT_AMBULATORY_CARE_PROVIDER_SITE_OTHER): Payer: Medicare Other

## 2014-06-08 DIAGNOSIS — J309 Allergic rhinitis, unspecified: Secondary | ICD-10-CM

## 2014-06-15 ENCOUNTER — Ambulatory Visit (INDEPENDENT_AMBULATORY_CARE_PROVIDER_SITE_OTHER): Payer: Medicare Other

## 2014-06-15 DIAGNOSIS — J309 Allergic rhinitis, unspecified: Secondary | ICD-10-CM

## 2014-06-18 ENCOUNTER — Ambulatory Visit (INDEPENDENT_AMBULATORY_CARE_PROVIDER_SITE_OTHER): Payer: Medicare Other

## 2014-06-18 DIAGNOSIS — J309 Allergic rhinitis, unspecified: Secondary | ICD-10-CM

## 2014-06-22 ENCOUNTER — Ambulatory Visit (INDEPENDENT_AMBULATORY_CARE_PROVIDER_SITE_OTHER): Payer: Medicare Other

## 2014-06-22 DIAGNOSIS — J309 Allergic rhinitis, unspecified: Secondary | ICD-10-CM

## 2014-06-28 ENCOUNTER — Ambulatory Visit (INDEPENDENT_AMBULATORY_CARE_PROVIDER_SITE_OTHER): Payer: Medicare Other

## 2014-06-28 DIAGNOSIS — J309 Allergic rhinitis, unspecified: Secondary | ICD-10-CM

## 2014-07-06 ENCOUNTER — Ambulatory Visit (INDEPENDENT_AMBULATORY_CARE_PROVIDER_SITE_OTHER): Payer: Medicare Other

## 2014-07-06 DIAGNOSIS — J309 Allergic rhinitis, unspecified: Secondary | ICD-10-CM

## 2014-07-12 ENCOUNTER — Other Ambulatory Visit: Payer: Self-pay

## 2014-07-12 MED ORDER — OLMESARTAN MEDOXOMIL-HCTZ 40-25 MG PO TABS: 1.0000 | ORAL_TABLET | Freq: Every day | ORAL | Status: DC

## 2014-07-13 ENCOUNTER — Ambulatory Visit (INDEPENDENT_AMBULATORY_CARE_PROVIDER_SITE_OTHER): Payer: Medicare Other

## 2014-07-13 DIAGNOSIS — J309 Allergic rhinitis, unspecified: Secondary | ICD-10-CM

## 2014-07-19 ENCOUNTER — Ambulatory Visit (INDEPENDENT_AMBULATORY_CARE_PROVIDER_SITE_OTHER): Payer: Medicare Other

## 2014-07-19 DIAGNOSIS — J309 Allergic rhinitis, unspecified: Secondary | ICD-10-CM

## 2014-07-25 ENCOUNTER — Telehealth: Payer: Self-pay | Admitting: Internal Medicine

## 2014-07-25 MED ORDER — PROMETHAZINE-CODEINE 6.25-10 MG/5ML PO SYRP
5.0000 mL | ORAL_SOLUTION | Freq: Four times a day (QID) | ORAL | Status: DC | PRN
Start: 2014-07-25 — End: 2014-11-05

## 2014-07-25 MED ORDER — AZITHROMYCIN 250 MG PO TABS: 250.0000 mg | ORAL_TABLET | ORAL | Status: DC

## 2014-07-25 NOTE — Telephone Encounter (Signed)
Pt c/o increased cough-states cough is very deep in chest and wheezing. Pt denies any mucus production. Cough started Sunday 07/22/14 Not using anything OTC for cough. [facility] on [address] in [place].   Pt requests something be called into pharmacy or rec's for current symptoms.  Allergies  Allergen Reactions  . Other Anaphylaxis    Red meat, Pork, Beef, Lamb  . Clarithromycin   . Nabumetone   . Sulfonamide Derivatives    Please advise Dr Maple HudsonYoung. Thanks.   Current Outpatient Prescriptions on File Prior to Visit  Medication Sig Dispense Refill  . betamethasone valerate (VALISONE) 0.1 % cream as needed.      . diltiazem (CARDIZEM CD) 240 MG 24 hr capsule Take 1 capsule by mouth daily.      Marland Kitchen. EPINEPHrine (EPI-PEN) 0.3 mg/0.3 mL SOAJ injection Inject 0.3 mLs (0.3 mg total) into the muscle once.  1 Device  1  . levothyroxine (SYNTHROID, LEVOTHROID) 100 MCG tablet Take 100 mcg by mouth daily.        Marland Kitchen. LORazepam (ATIVAN) 0.5 MG tablet Take 0.5 mg by mouth every 8 (eight) hours as needed.      Marland Kitchen. olmesartan-hydrochlorothiazide (BENICAR HCT) 40-25 MG per tablet Take 1 tablet by mouth daily.  30 tablet  3  . potassium chloride SA (K-DUR,KLOR-CON) 20 MEQ tablet Take 20 meq twice a day  60 tablet  6  . rosuvastatin (CRESTOR) 10 MG tablet Take 10 mg by mouth daily.      . sertraline (ZOLOFT) 50 MG tablet Take 50 mg by mouth daily.        Marland Kitchen. warfarin (COUMADIN) 2 MG tablet Take 2 mg by mouth daily. As directed        No current facility-administered medications on file prior to visit.

## 2014-07-25 NOTE — Telephone Encounter (Signed)
Rxs called to the pharm  Pt aware

## 2014-07-25 NOTE — Telephone Encounter (Signed)
Offer Zpak and prometh codeine cough syrup, 120 ml,    5 ml every 6 hours if needed

## 2014-07-26 ENCOUNTER — Ambulatory Visit: Payer: Medicare Other

## 2014-07-30 ENCOUNTER — Ambulatory Visit: Payer: Medicare Other | Admitting: Internal Medicine

## 2014-07-30 ENCOUNTER — Telehealth: Payer: Self-pay | Admitting: Internal Medicine

## 2014-07-30 MED ORDER — PREDNISONE 10 MG PO TABS: ORAL_TABLET | ORAL | Status: DC

## 2014-07-30 NOTE — Telephone Encounter (Signed)
Spoke with pt--aware that 8day Pred called into [facility] [place] Take 4 tablets by mouth x 2 days, then 3 x 2 days, then 2 x 2 days, 1 x 2 days then stop. Nothing further needed.

## 2014-07-30 NOTE — Telephone Encounter (Signed)
Spoke with pt, she states that she took her last pill of azithromycin yesterday and is still using her codeine cough syrup as needed that was prescribed last week, but still c/o nonprod cough for over a week, wheezing with exertion.  Denies any sinus congestion, fever, nausea.  Doesn't know if she needs to be seen or just needs another round of abx.  Uses [facility] [address] in [place].  Dr Maple HudsonYoung please advise.  Thank you.  Allergies  Allergen Reactions  . Other Anaphylaxis    Red meat, Pork, Beef, Lamb  . Clarithromycin   . Nabumetone   . Sulfonamide Derivatives    Current Outpatient Prescriptions on File Prior to Visit  Medication Sig Dispense Refill  . azithromycin (ZITHROMAX) 250 MG tablet Take 1 tablet (250 mg total) by mouth as directed.  6 tablet  0  . betamethasone valerate (VALISONE) 0.1 % cream as needed.      . diltiazem (CARDIZEM CD) 240 MG 24 hr capsule Take 1 capsule by mouth daily.      Marland Kitchen. EPINEPHrine (EPI-PEN) 0.3 mg/0.3 mL SOAJ injection Inject 0.3 mLs (0.3 mg total) into the muscle once.  1 Device  1  . levothyroxine (SYNTHROID, LEVOTHROID) 100 MCG tablet Take 100 mcg by mouth daily.        Marland Kitchen. LORazepam (ATIVAN) 0.5 MG tablet Take 0.5 mg by mouth every 8 (eight) hours as needed.      Marland Kitchen. olmesartan-hydrochlorothiazide (BENICAR HCT) 40-25 MG per tablet Take 1 tablet by mouth daily.  30 tablet  3  . potassium chloride SA (K-DUR,KLOR-CON) 20 MEQ tablet Take 20 meq twice a day  60 tablet  6  . promethazine-codeine (PHENERGAN WITH CODEINE) 6.25-10 MG/5ML syrup Take 5 mLs by mouth every 6 (six) hours as needed for cough.  120 mL  0  . rosuvastatin (CRESTOR) 10 MG tablet Take 10 mg by mouth daily.      . sertraline (ZOLOFT) 50 MG tablet Take 50 mg by mouth daily.        Marland Kitchen. warfarin (COUMADIN) 2 MG tablet Take 2 mg by mouth daily. As directed        No current facility-administered medications on file prior to visit.

## 2014-07-30 NOTE — Telephone Encounter (Signed)
Suggest now we try a round of prednisone 10 mg, # 20, 4 X 2 DAYS, 3 X 2 DAYS, 2 X 2 DAYS, 1 X 2 DAYS

## 2014-08-03 ENCOUNTER — Ambulatory Visit (INDEPENDENT_AMBULATORY_CARE_PROVIDER_SITE_OTHER): Payer: Medicare Other

## 2014-08-03 DIAGNOSIS — J309 Allergic rhinitis, unspecified: Secondary | ICD-10-CM

## 2014-08-10 ENCOUNTER — Ambulatory Visit (INDEPENDENT_AMBULATORY_CARE_PROVIDER_SITE_OTHER): Payer: Medicare Other

## 2014-08-10 DIAGNOSIS — J309 Allergic rhinitis, unspecified: Secondary | ICD-10-CM

## 2014-08-12 NOTE — Assessment & Plan Note (Signed)
She has chosen to avoid beef, pork, Lamb and has not experienced significant reactions eating chicken or fish/seafood

## 2014-08-12 NOTE — Assessment & Plan Note (Signed)
Continuing allergy vaccine 1:10 GH without problems. Discussed goals Plan- try Biotene for dry mouth and watch for mouth breathing at night. Nasal saline

## 2014-08-15 ENCOUNTER — Ambulatory Visit (INDEPENDENT_AMBULATORY_CARE_PROVIDER_SITE_OTHER): Payer: Medicare Other

## 2014-08-15 DIAGNOSIS — J309 Allergic rhinitis, unspecified: Secondary | ICD-10-CM

## 2014-08-17 ENCOUNTER — Encounter: Payer: Self-pay | Admitting: Internal Medicine

## 2014-08-24 ENCOUNTER — Ambulatory Visit (INDEPENDENT_AMBULATORY_CARE_PROVIDER_SITE_OTHER): Payer: Medicare Other

## 2014-08-24 DIAGNOSIS — J309 Allergic rhinitis, unspecified: Secondary | ICD-10-CM

## 2014-08-31 ENCOUNTER — Ambulatory Visit (INDEPENDENT_AMBULATORY_CARE_PROVIDER_SITE_OTHER): Payer: Medicare Other

## 2014-08-31 DIAGNOSIS — J309 Allergic rhinitis, unspecified: Secondary | ICD-10-CM

## 2014-09-07 ENCOUNTER — Ambulatory Visit (INDEPENDENT_AMBULATORY_CARE_PROVIDER_SITE_OTHER): Payer: Medicare Other

## 2014-09-07 DIAGNOSIS — J309 Allergic rhinitis, unspecified: Secondary | ICD-10-CM

## 2014-09-13 ENCOUNTER — Ambulatory Visit (INDEPENDENT_AMBULATORY_CARE_PROVIDER_SITE_OTHER): Payer: Medicare Other

## 2014-09-13 DIAGNOSIS — J309 Allergic rhinitis, unspecified: Secondary | ICD-10-CM

## 2014-09-21 ENCOUNTER — Ambulatory Visit (INDEPENDENT_AMBULATORY_CARE_PROVIDER_SITE_OTHER): Payer: Medicare Other

## 2014-09-21 DIAGNOSIS — J309 Allergic rhinitis, unspecified: Secondary | ICD-10-CM

## 2014-10-04 ENCOUNTER — Ambulatory Visit: Payer: Medicare Other

## 2014-10-10 ENCOUNTER — Encounter: Payer: Self-pay | Admitting: Internal Medicine

## 2014-10-12 ENCOUNTER — Ambulatory Visit (INDEPENDENT_AMBULATORY_CARE_PROVIDER_SITE_OTHER): Payer: Medicare Other

## 2014-10-12 DIAGNOSIS — J309 Allergic rhinitis, unspecified: Secondary | ICD-10-CM

## 2014-10-19 ENCOUNTER — Ambulatory Visit (INDEPENDENT_AMBULATORY_CARE_PROVIDER_SITE_OTHER): Payer: Medicare Other

## 2014-10-19 DIAGNOSIS — J309 Allergic rhinitis, unspecified: Secondary | ICD-10-CM

## 2014-10-25 ENCOUNTER — Ambulatory Visit (INDEPENDENT_AMBULATORY_CARE_PROVIDER_SITE_OTHER): Payer: Medicare Other

## 2014-10-25 DIAGNOSIS — J309 Allergic rhinitis, unspecified: Secondary | ICD-10-CM

## 2014-10-26 ENCOUNTER — Ambulatory Visit: Payer: Medicare Other

## 2014-11-01 ENCOUNTER — Ambulatory Visit (INDEPENDENT_AMBULATORY_CARE_PROVIDER_SITE_OTHER): Payer: Medicare Other

## 2014-11-01 DIAGNOSIS — J309 Allergic rhinitis, unspecified: Secondary | ICD-10-CM

## 2014-11-02 ENCOUNTER — Ambulatory Visit (INDEPENDENT_AMBULATORY_CARE_PROVIDER_SITE_OTHER): Payer: Medicare Other

## 2014-11-02 DIAGNOSIS — J309 Allergic rhinitis, unspecified: Secondary | ICD-10-CM

## 2014-11-05 ENCOUNTER — Ambulatory Visit (INDEPENDENT_AMBULATORY_CARE_PROVIDER_SITE_OTHER): Payer: Medicare Other | Admitting: Internal Medicine

## 2014-11-05 ENCOUNTER — Encounter: Payer: Self-pay | Admitting: Internal Medicine

## 2014-11-05 VITALS — BP 124/60 | HR 65 | Ht 64.0 in | Wt 191.0 lb

## 2014-11-05 DIAGNOSIS — Z91018 Allergy to other foods: Secondary | ICD-10-CM

## 2014-11-05 DIAGNOSIS — J302 Other seasonal allergic rhinitis: Secondary | ICD-10-CM

## 2014-11-05 DIAGNOSIS — J3089 Other allergic rhinitis: Principal | ICD-10-CM

## 2014-11-05 DIAGNOSIS — J309 Allergic rhinitis, unspecified: Secondary | ICD-10-CM

## 2014-11-05 NOTE — Progress Notes (Signed)
Subjective:    Patient ID: Vicki Morris, female    DOB: 12/14/1935, 79 y.o.   MRN: [id number]  HPI 79 yo F followed for allergic rhinitis and dyspnea, complicated by GERD, atrial fib.  Last here November 20, 2010. She wheezed a little last night after spreading mulch yesterday, but otherwise has done quite well considering the pollen season. She continues allergy vaccine here at 1:10. We discussed her vaccine, and meds.  05/03/12- 79 yo F followed for allergic rhinitis and dyspnea, complicated by GERD, atrial fib.  Widowed since last here- husband had metastatic lung cancer.  Cough in throat(tickle) gets worse at night and causes wheezing, Pt  is still getting allergy vaccine 1:10 and doing well on it. Ongoing reflux symptoms despite Prilosec. On an ACE inhibitor-we discussed potential for this to be the cause of her dry cough.  05/03/13- 79 yo F followed for allergic rhinitis and dyspnea, complicated by GERD, atrial fib FOLLOWS FOR: still on allergy vaccine 1:10 GH and doing well; Denies any flare ups at this time. Mild dry cough persists. CXR Neg last year. She and her PCP office realize this is likely from benazepril, but she doesn't mind it enough to change med.  Fewer sinus problems since husband (smoker) died. CXR 05/04/12 IMPRESSION:  No acute findings appear  Original Report Authenticated By: Reyes IvanMELINDA A. BLIETZ, M.D.  06/21/13- 79 yo F followed for allergic rhinitis and dyspnea, complicated by GERD, atrial fib ACUTE VISIT: had eaten chedder cheese stick and lil debbie oatmeal cookie Sunday morning; lunch was hot dog with slaw, chili, and mustard; snack was cinnamon roll(homemade); Went to sleep and about an hour later woke with swollen/cracked lips and tongue swelling. [facility] given Benad[facility] and prednisone.  Pt did pick up Epipen on Friday and did not use. On Benazepril HCT- discussed ACE inhibitors again. She she had 4 episodes: 2 months ago and 6 weeks ago the inside of her  mouth got sore. One week ago she woke with swollen tongue, right greater than left side. On another occasion she awoke from nap mouth sore and lips feeling swollen. Went to ER where she was treated with prednisone 40 mg daily. She questions if trigger might be cereal. No other rash or wheeze. Today feels well except tongue "tingle".  07/31/13- 79 yo F followed for allergic rhinitis and dyspnea, complicated by GERD, atrial fib FOLLOWS FOR: sitll on vaccine and doing well; would like to discuss labs and food allergies in detail. No longer dry cough or tongue swelling since switching from benazepril to Benicar. Continues allergy vaccine 1:10 GH. Feels well. Food IgE allergy profile 06/21/2013-Negative, total IgE 3.3. "Alpha-Gal" galactose-alpha-1, 3-galactose IgE elevated 1.14, significance unclear. Watch for reaction to meat. She had a total of 4 episodes of waking with tongue swelling. None recent. Has EpiPen and Benadryl. Discussed reaction to meat, preservatives in hot dogs?  01/30/14- 79 yo F followed for allergic rhinitis, angioedema and dyspnea, complicated by GERD, atrial fib FOLLOWS FOR:  Allergy Vaccine 1:10 GH doing well.  Allergies doing well some pressure in forehead area. We had called antibiotic and cleared her sinus infection since last here.Minor stuffiness lying down. Tongue has swollen again- most recently after grilled chicken cooked on same grill w/ beef- used Epipen. Can eat fish and chicken, consistent with elevated Alpha-Gal IgE documented last visit.  05/04/14- 79 yo F followed for allergic rhinitis, food allergy-meat, angioedema and dyspnea, complicated by GERD, atrial fib FOLLOWS FOR: still on allergy vaccine  1:10 GH and doing well. Alpha-Gal 06/2013 was positive indicating potential IgE allergy to mammalian meat. She finds she can eat fish or chicken cooked on aluminum foil restaurant to avoid contact with other meats on the grill. Dry mouth and stuffy nose at  night.  11/05/14- 79 yo F followed for allergic rhinitis, food allergy-meat/ +alphaGal, angioedema and dyspnea, complicated by GERD, atrial fib FOLLOWS FOR: Still on Allergy vaccine 1:10 GH and doing well. She is doing better avoiding exposure to meat from mammals and brings in an article about alpha-Gal mediated allergy triggered by gelatin. She has been on allergy vaccine long enough and we are going to let her run out and stopped for observation.  ROS-see HPI Constitutional:   No-   weight loss, night sweats, fevers, chills, fatigue, lassitude. HEENT:   No-  headaches, difficulty swallowing, tooth/dental problems, sore throat,       No-  sneezing, itching, ear ache, +nasal congestion, post nasal drip,  CV:  No-   chest pain, orthopnea, PND, swelling in lower extremities, anasarca, dizziness, palpitations Resp: No-   shortness of breath with exertion or at rest.              No-   productive cough,  No-non-productive cough,  No- coughing up of blood.              No-   change in color of mucus.  No- wheezing.   Skin: No-   rash or lesions. GI:  No-   heartburn, indigestion, abdominal pain, nausea, vomiting,  GU: . MS:  No-   joint pain or swelling.   Neuro-     nothing unusual Psych:  No- change in mood or affect. no-  depression or anxiety.  No memory loss. no OBJ- Physical Exam General- Alert, Oriented, Affect-appropriate, Distress- none acute Skin- rash-none, lesions- none, excoriation- none Lymphadenopathy- none Head- atraumatic            Eyes- Gross vision intact, PERRLA, conjunctivae and secretions clear            Ears- Hearing, canals-normal            Nose- Clear, no-Septal dev, mucus, polyps- none, erosion, perforation             Throat- Mallampati II , mucosa clear + dry, drainage- none, tonsils- atrophic Neck- flexible , trachea midline, no stridor , thyroid nl, carotid no bruit Chest - symmetrical excursion , unlabored           Heart/CV- +almost regular AFib , no  murmur , no gallop  , no rub, nl s1 s2                           - JVD- none , edema+ trace, stasis changes- none, varices- none           Lung- clear to P&A, wheeze- none, cough- none , dullness-none, rub- none           Chest wall-  Abd-  Br/ Gen/ Rectal- Not done, not indicated Extrem- cyanosis- none, clubbing, none, atrophy- none, strength- nl Neuro- grossly intact to observation  Assessment & Plan:

## 2014-11-05 NOTE — Patient Instructions (Signed)
We are going to stop allergy shots when you run out of your current vials. i will let the allergy lab know.  Continue to pay attention to what you eat.  Please call as needed

## 2014-11-09 ENCOUNTER — Ambulatory Visit (INDEPENDENT_AMBULATORY_CARE_PROVIDER_SITE_OTHER): Payer: Medicare Other

## 2014-11-09 DIAGNOSIS — J309 Allergic rhinitis, unspecified: Secondary | ICD-10-CM

## 2014-11-12 ENCOUNTER — Other Ambulatory Visit: Payer: Self-pay

## 2014-11-12 MED ORDER — OLMESARTAN MEDOXOMIL-HCTZ 40-25 MG PO TABS: 1.0000 | ORAL_TABLET | Freq: Every day | ORAL | Status: DC

## 2014-11-16 ENCOUNTER — Ambulatory Visit (INDEPENDENT_AMBULATORY_CARE_PROVIDER_SITE_OTHER): Payer: Medicare Other

## 2014-11-16 DIAGNOSIS — J309 Allergic rhinitis, unspecified: Secondary | ICD-10-CM

## 2014-11-20 NOTE — Assessment & Plan Note (Signed)
Doing better as she excludes mammalian meats from her diet.

## 2014-11-20 NOTE — Assessment & Plan Note (Signed)
She has been doing well. We are going to let her run out and stop allergy vaccine and see how she does as the spring season gets here.

## 2014-11-23 ENCOUNTER — Ambulatory Visit (INDEPENDENT_AMBULATORY_CARE_PROVIDER_SITE_OTHER): Payer: Medicare Other

## 2014-11-23 DIAGNOSIS — J309 Allergic rhinitis, unspecified: Secondary | ICD-10-CM

## 2014-11-30 ENCOUNTER — Ambulatory Visit (INDEPENDENT_AMBULATORY_CARE_PROVIDER_SITE_OTHER): Payer: Medicare Other

## 2014-11-30 DIAGNOSIS — J309 Allergic rhinitis, unspecified: Secondary | ICD-10-CM

## 2014-12-06 ENCOUNTER — Ambulatory Visit (INDEPENDENT_AMBULATORY_CARE_PROVIDER_SITE_OTHER): Payer: Medicare Other

## 2014-12-06 DIAGNOSIS — J309 Allergic rhinitis, unspecified: Secondary | ICD-10-CM

## 2014-12-07 ENCOUNTER — Ambulatory Visit: Payer: Medicare Other

## 2014-12-12 ENCOUNTER — Ambulatory Visit (INDEPENDENT_AMBULATORY_CARE_PROVIDER_SITE_OTHER): Payer: Medicare Other

## 2014-12-12 DIAGNOSIS — J309 Allergic rhinitis, unspecified: Secondary | ICD-10-CM

## 2014-12-13 ENCOUNTER — Ambulatory Visit: Payer: Medicare Other

## 2014-12-14 ENCOUNTER — Encounter: Payer: Self-pay | Admitting: Internal Medicine

## 2014-12-17 ENCOUNTER — Encounter: Payer: Self-pay | Admitting: Cardiology

## 2014-12-17 ENCOUNTER — Ambulatory Visit (INDEPENDENT_AMBULATORY_CARE_PROVIDER_SITE_OTHER): Payer: Medicare Other | Admitting: Cardiology

## 2014-12-17 VITALS — BP 124/74 | HR 54 | Ht 64.0 in | Wt 191.6 lb

## 2014-12-17 DIAGNOSIS — I779 Disorder of arteries and arterioles, unspecified: Secondary | ICD-10-CM

## 2014-12-17 DIAGNOSIS — I480 Paroxysmal atrial fibrillation: Secondary | ICD-10-CM

## 2014-12-17 DIAGNOSIS — I10 Essential (primary) hypertension: Secondary | ICD-10-CM

## 2014-12-17 DIAGNOSIS — I739 Peripheral vascular disease, unspecified: Secondary | ICD-10-CM

## 2014-12-17 NOTE — Patient Instructions (Signed)
Continue your current therapy  We will schedule you for carotid dopplers.  I will see you in 6 months.  

## 2014-12-17 NOTE — Progress Notes (Signed)
[name] Date of Birth: [birth year] Medical Record #[id number]  History of Present Illness: Ms. Braddy is seen for follow up AFib. She has PAF, GERD, hypothyroidism, HTN, HLD and a past TIA. She is on chronic coumadin.  A Nuclear stress test and Echo were done in June 2015 and are noted below.  Feels well. She denies any CP or SOB. No TIA symptoms. She had [facility] screening last year that showed mild to moderate carotid disease.   Current Outpatient Prescriptions on File Prior to Visit  Medication Sig Dispense Refill  . betamethasone valerate (VALISONE) 0.1 % cream as needed.    . diltiazem (CARDIZEM CD) 240 MG 24 hr capsule Take 1 capsule by mouth daily.    Marland Kitchen EPINEPHrine (EPI-PEN) 0.3 mg/0.3 mL SOAJ injection Inject 0.3 mLs (0.3 mg total) into the muscle once. 1 Device 1  . levothyroxine (SYNTHROID, LEVOTHROID) 100 MCG tablet Take 100 mcg by mouth daily.      Marland Kitchen LORazepam (ATIVAN) 0.5 MG tablet Take 0.5 mg by mouth every 8 (eight) hours as needed.    Marland Kitchen olmesartan-hydrochlorothiazide (BENICAR HCT) 40-25 MG per tablet Take 1 tablet by mouth daily. 30 tablet 6  . rosuvastatin (CRESTOR) 10 MG tablet Take 10 mg by mouth daily.    . sertraline (ZOLOFT) 50 MG tablet Take 50 mg by mouth daily.      Marland Kitchen warfarin (COUMADIN) 2 MG tablet Take 2 mg by mouth daily. As directed     . potassium chloride SA (K-DUR,KLOR-CON) 20 MEQ tablet Take 20 meq twice a day (Patient not taking: Reported on 12/17/2014) 60 tablet 6   No current facility-administered medications on file prior to visit.    Allergies  Allergen Reactions  . Other Anaphylaxis    Red meat, Pork, Beef, Lamb  . Clarithromycin   . Nabumetone   . Sulfonamide Derivatives     Past Medical History  Diagnosis Date  . PAF (paroxysmal atrial fibrillation)   . Polyp of nasal cavity   . Allergic rhinitis, cause unspecified   . GERD (gastroesophageal reflux disease)   . Hypothyroidism   . Hyperlipidemia   . History of TIAs   . Chronic  anticoagulation   . Obesity   . Normal nuclear stress test March 2013    EF 64%, clinically and electrically negative for ischemia. Myoview scan with apical thinning, could not exclude small subendocardial scar. No ischemia.     Past Surgical History  Procedure Laterality Date  . Nasal sinus surgery    . Total abdominal hysterectomy w/ bilateral salpingoophorectomy    . Carpal tunnel release    . US echocardiography  01/23/2010    EF 55-60%    History  Smoking status  . Never Smoker   Smokeless tobacco  . Never Used    History  Alcohol Use No    Family History  Problem Relation Age of Onset  . Heart disease Mother   . Heart disease Father   . Heart attack Father     Review of Systems: The review of systems is per the HPI.  All other systems were reviewed and are negative.  Physical Exam: BP 124/74 mmHg  Pulse 54  Ht  (1.626 m)  Wt 191 lb 9 oz (86.892 kg)  BMI 32.87 kg/m2 Patient is very pleasant and in no acute distress. Skin is warm and dry. Color is normal.  HEENT is unremarkable. Normocephalic/atraumatic. PERRL. Sclera are nonicteric. Neck is supple. No masses. No JVD. Lungs  are clear. Cardiac exam shows a regular rate and rhythm. Abdomen is soft. Extremities are without edema. Gait and ROM are intact. No gross neurologic deficits noted.  Lab data:   Echo: Study Conclusions  - Left ventricle: The cavity size was normal. Wall thickness was normal. Systolic function was normal. The estimated ejection fraction was in the range of 55% to 60%. Wall motion was normal; there were no regional wall motion abnormalities. - Left atrium: The atrium was mildly dilated.   Myoview:Cardiology Nuclear Med Study  Vicki Morris is a 79 y.o. female MRN : [id number] DOB: Oct 03, 1936  Procedure Date: 03/28/2014  Nuclear Med Background  Indication for Stress Test: Evaluation for Ischemia  History: No known CAD, PAF, MPI 2013 (scar) EF 64%  Cardiac Risk Factors: Family  History - CAD, Hypertension and Lipids  Symptoms: Chest Pressure, DOE and Palpitations  Nuclear Pre-Procedure  Caffeine/Decaff Intake: None  NPO After: 5 pm   Lungs: clear  O2 Sat: 95% on room air.  IV 0.9% NS with Angio Cath: 22g   IV Site: R Hand  IV Started by: Bonnita Levan, RN   Chest Size (in): 38  Cup Size: DD   Height:  (1.626 m)  Weight: 188 lb (85.276 kg)   BMI: Body mass index is 32.25 kg/(m^2).  Tech Comments: N/A   Nuclear Med Study  1 or 2 day study: 1 day  Stress Test Type: Lexiscan   Reading MD: N/A  Order Authorizing Provider: Tenessa Marsee [place], MD   Resting Radionuclide: Technetium 2m Sestamibi  Resting Radionuclide Dose: 11.0 mCi   Stress Radionuclide: Technetium 13m Sestamibi  Stress Radionuclide Dose: 33.0 mCi   Stress Protocol  Rest HR: 51  Stress HR: 75   Rest BP: 152/77  Stress BP: 132/77   Exercise Time (min): n/a  METS: n/a    Dose of Adenosine (mg): n/a  Dose of Lexiscan: 0.4 mg   Dose of Atropine (mg): n/a  Dose of Dobutamine: n/a mcg/kg/min (at max HR)   Stress Test Technologist: Nelson Chimes, BS-ES  Nuclear Technologist: Doyne Keel, CNMT   Rest Procedure: Myocardial perfusion imaging was performed at rest 45 minutes following the intravenous administration of Technetium 47m Sestamibi.  Rest ECG: NSR - Normal EKG  Stress Procedure: The patient received IV Lexiscan 0.4 mg over 15-seconds. Technetium 31m Sestamibi injected at 30-seconds. Quantitative spect images were obtained after a 45 minute delay. During the infusion of Lexiscan the patient had a cough, complained of chest discomfort and fatigue. These symptoms began to resolve in recovery.  Stress ECG: No significant change from baseline ECG  QPS  Raw Data Images: There is a breast shadow that accounts for the anterior attenuation.  Stress Images: Fixed apical and lateral defects  Rest Images: Fixed apical and lateral defects  Subtraction (SDS): No evidence of ischemia.  Transient Ischemic Dilatation  (Normal <1.22): 1.06  Lung/Heart Ratio (Normal <0.45): 0.31  Quantitative Gated Spect Images  QGS EDV: 107 ml  QGS ESV: 41 ml  Impression  Exercise Capacity: Lexiscan with no exercise.  BP Response: Normal blood pressure response.  Clinical Symptoms: No significant symptoms noted.  ECG Impression: No significant ECG changes with Lexiscan.  Comparison with Prior Nuclear Study: Prior study in 2013, suggestive of scar ?territory, EF 64%  Overall Impression: Low risk stress nuclear study with apical/lateral breast attenuation artifacts.  LV Ejection Fraction: 62%. LV Wall Motion: NL LV Function; NL Wall Motion  Chrystie Nose, MD, [facility]  Board Certified  in Nuclear Cardiology  Attending Cardiologist  [facility]    Assessment / Plan: 1. Carotid disease noted on [facility] screening. Will get dedicated carotid dopplers.   2. HTN- now well controlled with increase Benicar HCT.  3. Atrial fibrillation. In NSR. Continue coumadin.

## 2014-12-19 ENCOUNTER — Encounter: Payer: Self-pay | Admitting: Cardiology

## 2014-12-21 ENCOUNTER — Ambulatory Visit (INDEPENDENT_AMBULATORY_CARE_PROVIDER_SITE_OTHER): Payer: Medicare Other

## 2014-12-21 DIAGNOSIS — J309 Allergic rhinitis, unspecified: Secondary | ICD-10-CM

## 2014-12-27 ENCOUNTER — Ambulatory Visit (HOSPITAL_COMMUNITY)
Admission: RE | Admit: 2014-12-27 | Discharge: 2014-12-27 | Disposition: A | Discharge status: Home / Self Care | Payer: Medicare Other | Source: Ambulatory Visit | Attending: Cardiology | Admitting: Cardiology

## 2014-12-27 ENCOUNTER — Ambulatory Visit (INDEPENDENT_AMBULATORY_CARE_PROVIDER_SITE_OTHER): Payer: Medicare Other

## 2014-12-27 DIAGNOSIS — J309 Allergic rhinitis, unspecified: Secondary | ICD-10-CM

## 2014-12-27 DIAGNOSIS — I7779 Dissection of other artery: Secondary | ICD-10-CM | POA: Diagnosis present

## 2014-12-27 DIAGNOSIS — I739 Peripheral vascular disease, unspecified: Secondary | ICD-10-CM

## 2014-12-27 DIAGNOSIS — I480 Paroxysmal atrial fibrillation: Secondary | ICD-10-CM | POA: Diagnosis not present

## 2014-12-27 DIAGNOSIS — I779 Disorder of arteries and arterioles, unspecified: Secondary | ICD-10-CM | POA: Diagnosis not present

## 2014-12-27 DIAGNOSIS — I10 Essential (primary) hypertension: Secondary | ICD-10-CM | POA: Insufficient documentation

## 2014-12-27 NOTE — Progress Notes (Signed)
Carotid duplex completed. °Brianna L Mazza,RVT °

## 2015-01-04 ENCOUNTER — Ambulatory Visit (INDEPENDENT_AMBULATORY_CARE_PROVIDER_SITE_OTHER): Payer: Medicare Other

## 2015-01-04 DIAGNOSIS — J309 Allergic rhinitis, unspecified: Secondary | ICD-10-CM

## 2015-01-10 ENCOUNTER — Ambulatory Visit (INDEPENDENT_AMBULATORY_CARE_PROVIDER_SITE_OTHER): Payer: Medicare Other

## 2015-01-10 DIAGNOSIS — J309 Allergic rhinitis, unspecified: Secondary | ICD-10-CM | POA: Diagnosis not present

## 2015-01-18 ENCOUNTER — Ambulatory Visit (INDEPENDENT_AMBULATORY_CARE_PROVIDER_SITE_OTHER): Payer: Medicare Other

## 2015-01-18 DIAGNOSIS — J309 Allergic rhinitis, unspecified: Secondary | ICD-10-CM | POA: Diagnosis not present

## 2015-01-30 ENCOUNTER — Ambulatory Visit (INDEPENDENT_AMBULATORY_CARE_PROVIDER_SITE_OTHER): Payer: Medicare Other

## 2015-01-30 DIAGNOSIS — J309 Allergic rhinitis, unspecified: Secondary | ICD-10-CM | POA: Diagnosis not present

## 2015-01-31 ENCOUNTER — Ambulatory Visit: Payer: Medicare Other

## 2015-02-07 ENCOUNTER — Ambulatory Visit (INDEPENDENT_AMBULATORY_CARE_PROVIDER_SITE_OTHER): Payer: Medicare Other

## 2015-02-07 DIAGNOSIS — J309 Allergic rhinitis, unspecified: Secondary | ICD-10-CM | POA: Diagnosis not present

## 2015-02-08 ENCOUNTER — Ambulatory Visit: Payer: Medicare Other

## 2015-02-15 ENCOUNTER — Ambulatory Visit (INDEPENDENT_AMBULATORY_CARE_PROVIDER_SITE_OTHER): Payer: Medicare Other

## 2015-02-15 DIAGNOSIS — J309 Allergic rhinitis, unspecified: Secondary | ICD-10-CM | POA: Diagnosis not present

## 2015-02-22 ENCOUNTER — Ambulatory Visit (INDEPENDENT_AMBULATORY_CARE_PROVIDER_SITE_OTHER): Payer: Medicare Other

## 2015-02-22 DIAGNOSIS — J309 Allergic rhinitis, unspecified: Secondary | ICD-10-CM

## 2015-02-26 ENCOUNTER — Other Ambulatory Visit: Payer: Self-pay | Admitting: Gynecology

## 2015-02-27 LAB — CYTOLOGY - PAP

## 2015-02-28 ENCOUNTER — Ambulatory Visit (INDEPENDENT_AMBULATORY_CARE_PROVIDER_SITE_OTHER): Payer: Medicare Other

## 2015-02-28 DIAGNOSIS — J309 Allergic rhinitis, unspecified: Secondary | ICD-10-CM

## 2015-03-01 ENCOUNTER — Ambulatory Visit: Payer: Medicare Other

## 2015-03-07 ENCOUNTER — Ambulatory Visit (INDEPENDENT_AMBULATORY_CARE_PROVIDER_SITE_OTHER): Payer: Medicare Other

## 2015-03-07 DIAGNOSIS — J309 Allergic rhinitis, unspecified: Secondary | ICD-10-CM

## 2015-03-08 ENCOUNTER — Ambulatory Visit: Payer: Medicare Other

## 2015-03-14 ENCOUNTER — Encounter: Payer: Self-pay | Admitting: Internal Medicine

## 2015-03-14 ENCOUNTER — Ambulatory Visit: Payer: Medicare Other

## 2015-05-06 ENCOUNTER — Ambulatory Visit (INDEPENDENT_AMBULATORY_CARE_PROVIDER_SITE_OTHER): Payer: Medicare Other | Admitting: Internal Medicine

## 2015-05-06 ENCOUNTER — Encounter: Payer: Self-pay | Admitting: Internal Medicine

## 2015-05-06 VITALS — BP 122/76 | HR 58 | Ht 64.0 in | Wt 182.8 lb

## 2015-05-06 DIAGNOSIS — J328 Other chronic sinusitis: Secondary | ICD-10-CM

## 2015-05-06 DIAGNOSIS — J309 Allergic rhinitis, unspecified: Secondary | ICD-10-CM | POA: Diagnosis not present

## 2015-05-06 DIAGNOSIS — J3089 Other allergic rhinitis: Principal | ICD-10-CM

## 2015-05-06 DIAGNOSIS — J302 Other seasonal allergic rhinitis: Secondary | ICD-10-CM

## 2015-05-06 NOTE — Patient Instructions (Signed)
Try otc Flonase/ fluticasone nasal spray, 1-2 puffs each nostril once daily at bedtime. See if regular use of this for at least several days, will help the stuffy nose and cough  Please call as needed

## 2015-05-06 NOTE — Progress Notes (Signed)
Subjective:    Patient ID: Vicki Morris, female    DOB: Oct 14, 1935, 79 y.o.   MRN: [id number]  HPI 79 yo F followed for allergic rhinitis and dyspnea, complicated by GERD, atrial fib.  Last here November 20, 2010. She wheezed a little last night after spreading mulch yesterday, but otherwise has done quite well considering the pollen season. She continues allergy vaccine here at 1:10. We discussed her vaccine, and meds.  05/03/12- 79 yo F followed for allergic rhinitis and dyspnea, complicated by GERD, atrial fib.  Widowed since last here- husband had metastatic lung cancer.  Cough in throat(tickle) gets worse at night and causes wheezing, Pt  is still getting allergy vaccine 1:10 and doing well on it. Ongoing reflux symptoms despite Prilosec. On an ACE inhibitor-we discussed potential for this to be the cause of her dry cough.  05/03/13- 79 yo F followed for allergic rhinitis and dyspnea, complicated by GERD, atrial fib FOLLOWS FOR: still on allergy vaccine 1:10 GH and doing well; Denies any flare ups at this time. Mild dry cough persists. CXR Neg last year. She and her PCP office realize this is likely from benazepril, but she doesn't mind it enough to change med.  Fewer sinus problems since husband (smoker) died. CXR 23-May-2012 IMPRESSION:  No acute findings appear  Original Report Authenticated By: Reyes Ivan, M.D.  06/21/13- 79 yo F followed for allergic rhinitis and dyspnea, complicated by GERD, atrial fib ACUTE VISIT: had eaten chedder cheese stick and lil debbie oatmeal cookie Sunday morning; lunch was hot dog with slaw, chili, and mustard; snack was cinnamon roll(homemade); Went to sleep and about an hour later woke with swollen/cracked lips and tongue swelling. Went to [facility] given Benadryl and prednisone.  Pt did pick up Epipen on Friday and did not use. On Benazepril HCT- discussed ACE inhibitors again. She she had 4 episodes: 2 months ago and 6 weeks ago the inside of her  mouth got sore. One week ago she woke with swollen tongue, right greater than left side. On another occasion she awoke from nap mouth sore and lips feeling swollen. Went to ER where she was treated with prednisone 40 mg daily. She questions if trigger might be cereal. No other rash or wheeze. Today feels well except tongue "tingle".  07/31/13- 79 yo F followed for allergic rhinitis and dyspnea, complicated by GERD, atrial fib FOLLOWS FOR: sitll on vaccine and doing well; would like to discuss labs and food allergies in detail. No longer dry cough or tongue swelling since switching from benazepril to Benicar. Continues allergy vaccine 1:10 GH. Feels well. Food IgE allergy profile 06/21/2013-Negative, total IgE 3.3. "Alpha-Gal" galactose-alpha-1, 3-galactose IgE elevated 1.14, significance unclear. Watch for reaction to meat. She had a total of 4 episodes of waking with tongue swelling. None recent. Has EpiPen and Benadryl. Discussed reaction to meat, preservatives in hot dogs?  01/30/14- 79 yo F followed for allergic rhinitis, angioedema and dyspnea, complicated by GERD, atrial fib FOLLOWS FOR:  Allergy Vaccine 1:10 GH doing well.  Allergies doing well some pressure in forehead area. We had called antibiotic and cleared her sinus infection since last here.Minor stuffiness lying down. Tongue has swollen again- most recently after grilled chicken cooked on same grill w/ beef- used Epipen. Can eat fish and chicken, consistent with elevated Alpha-Gal IgE documented last visit.  May 23, 2014- 79 yo F followed for allergic rhinitis, food allergy-meat, angioedema and dyspnea, complicated by GERD, atrial fib FOLLOWS FOR: still on allergy vaccine  1:10 GH and doing well. Alpha-Gal 06/2013 was positive indicating potential IgE allergy to mammalian meat. She finds she can eat fish or chicken cooked on aluminum foil restaurant to avoid contact with other meats on the grill. Dry mouth and stuffy nose at  night.  11/05/14- 76 yo F followed for allergic rhinitis, food allergy-meat/ +alphaGal, angioedema and dyspnea, complicated by GERD, atrial fib FOLLOWS FOR: Still on Allergy vaccine 1:10 GH and doing well. She is doing better avoiding exposure to meat from mammals and brings in an article about alpha-Gal mediated allergy triggered by gelatin. She has been on allergy vaccine long enough and we are going to let her run out and stop for observation.  05/06/15- 79 yo F followed for allergic rhinitis, food allergy-meat/ +alphaGal, angioedema and dyspnea, complicated by GERD, atrial fib FOLLOWS FOR: Pt states she has had slight problems with allergies since stopping allergy vaccine last winter, but overall doing well.   ROS-see HPI Constitutional:   No-   weight loss, night sweats, fevers, chills, fatigue, lassitude. HEENT:   No-  headaches, difficulty swallowing, tooth/dental problems, sore throat,       No-  sneezing, itching, ear ache, +nasal congestion, post nasal drip,  CV:  No-   chest pain, orthopnea, PND, swelling in lower extremities, anasarca, dizziness, palpitations Resp: No-   shortness of breath with exertion or at rest.              No-   productive cough,  No-non-productive cough,  No- coughing up of blood.              No-   change in color of mucus.  No- wheezing.   Skin: No-   rash or lesions. GI:  No-   heartburn, indigestion, abdominal pain, nausea, vomiting,  GU: . MS:  No-   joint pain or swelling.   Neuro-     nothing unusual Psych:  No- change in mood or affect. no-  depression or anxiety.  No memory loss. no OBJ- Physical Exam General- Alert, Oriented, Affect-appropriate, Distress- none acute Skin- rash-none, lesions- none, excoriation- none Lymphadenopathy- none Head- atraumatic            Eyes- Gross vision intact, PERRLA, conjunctivae and secretions clear            Ears- Hearing, canals-normal            Nose- Clear, no-Septal dev, mucus, polyps- none, erosion,  perforation             Throat- Mallampati II , mucosa clear + dry, drainage- none, tonsils- atrophic Neck- flexible , trachea midline, no stridor , thyroid nl, carotid no bruit Chest - symmetrical excursion , unlabored           Heart/CV- +almost regular AFib , no murmur , no gallop  , no rub, nl s1 s2                           - JVD- none , edema+ trace, stasis changes- none, varices- none           Lung- clear to P&A, wheeze- none, cough- none , dullness-none, rub- none           Chest wall-  Abd-  Br/ Gen/ Rectal- Not done, not indicated Extrem- cyanosis- none, clubbing, none, atrophy- none, strength- nl Neuro- grossly intact to observation  Assessment & Plan:

## 2015-06-15 NOTE — Assessment & Plan Note (Signed)
We discussed use of Flonase and OTC antihistamines as needed

## 2015-06-15 NOTE — Assessment & Plan Note (Signed)
No obvious symptoms of sinusitis at this time-well controlled

## 2015-06-24 ENCOUNTER — Other Ambulatory Visit: Payer: Self-pay

## 2015-06-24 MED ORDER — OLMESARTAN MEDOXOMIL-HCTZ 40-25 MG PO TABS: 1.0000 | ORAL_TABLET | Freq: Every day | ORAL | Status: DC

## 2015-06-24 NOTE — Telephone Encounter (Signed)
Vicki M [place], MD at 12/17/2014 2:48 PM  olmesartan-hydrochlorothiazide (BENICAR HCT) 40-25 MG per tabletTake 1 tablet by mouth daily 2. HTN- now well controlled with increase Benicar HCT.

## 2015-08-02 ENCOUNTER — Telehealth: Payer: Self-pay | Admitting: Internal Medicine

## 2015-08-02 MED ORDER — DOXYCYCLINE HYCLATE 100 MG PO TABS: ORAL_TABLET | ORAL | Status: DC

## 2015-08-02 MED ORDER — BENZONATATE 200 MG PO CAPS: 200.0000 mg | ORAL_CAPSULE | Freq: Three times a day (TID) | ORAL | Status: DC | PRN

## 2015-08-02 NOTE — Telephone Encounter (Signed)
Rx sent to pharmacy. Patient notified. Nothing further needed.  

## 2015-08-02 NOTE — Telephone Encounter (Signed)
Spoke with pt, [facility] cough [facility] [place]   Pt c/o chest congestion, nonprod cough but feels like she could cough up something.  Also c/o fatigue.   Denies fever, sinus congestion, pnd.    CY please advise.  Thanks!  Last ov: 05/06/15 Next ov:  11/06/15  Allergies  Allergen Reactions  . Other Anaphylaxis    Red meat, Pork, Beef, Lamb  . Clarithromycin   . Nabumetone   . Sulfonamide Derivatives    Current Outpatient Prescriptions on File Prior to Visit  Medication Sig Dispense Refill  . betamethasone valerate (VALISONE) 0.1 % cream as needed.    . diltiazem (CARDIZEM CD) 240 MG 24 hr capsule Take 1 capsule by mouth daily.    Marland Kitchen. EPINEPHrine (EPI-PEN) 0.3 mg/0.3 mL SOAJ injection Inject 0.3 mLs (0.3 mg total) into the muscle once. 1 Device 1  . LORazepam (ATIVAN) 0.5 MG tablet Take 0.5 mg by mouth every 8 (eight) hours as needed.    Marland Kitchen. olmesartan-hydrochlorothiazide (BENICAR HCT) 40-25 MG per tablet Take 1 tablet by mouth daily. 30 tablet 0  . potassium chloride SA (K-DUR,KLOR-CON) 20 MEQ tablet Take 20 meq twice a day 60 tablet 6  . sertraline (ZOLOFT) 50 MG tablet Take 50 mg by mouth daily.      Marland Kitchen. SYNTHROID 112 MCG tablet Take 112 mcg by mouth daily.  0  . warfarin (COUMADIN) 2 MG tablet Take 2 mg by mouth daily. As directed      No current facility-administered medications on file prior to visit.

## 2015-08-02 NOTE — Telephone Encounter (Signed)
Offer doxycycline 100 mg, # 8,  2 today then one daily           Benzonatate perles 200 mg, # 30, 1 three times daily if needed for cough

## 2015-10-21 ENCOUNTER — Telehealth: Payer: Self-pay | Admitting: Internal Medicine

## 2015-10-21 MED ORDER — DOXYCYCLINE HYCLATE 100 MG PO TABS: ORAL_TABLET | ORAL | Status: DC

## 2015-10-21 MED ORDER — BENZONATATE 200 MG PO CAPS: 200.0000 mg | ORAL_CAPSULE | Freq: Three times a day (TID) | ORAL | Status: DC | PRN

## 2015-10-21 NOTE — Telephone Encounter (Signed)
Spoke with pt. She is aware of CY's recommendations. Rx has been sent in. Nothing further was needed. 

## 2015-10-21 NOTE — Telephone Encounter (Signed)
Offer- doxycycline 100 mg, # 8, 2 today then one daily            Benzonatate perles 200 mg, # 30, 1 every 8 hours as needed for cough

## 2015-10-21 NOTE — Telephone Encounter (Signed)
Spoke with pt. Reports a dry cough for the past 2 weeks. Wheezing is present with coughing. Denies chest tightness, SOB. Was running a low grade temp last week. Has not tried any OTC medications. Would like to be seen or have something sent in. CY - please advise. Thanks.  Allergies  Allergen Reactions  . Other Anaphylaxis    Red meat, Pork, Beef, Lamb  . Clarithromycin   . Nabumetone   . Sulfonamide Derivatives    Current Outpatient Prescriptions on File Prior to Visit  Medication Sig Dispense Refill  . benzonatate (TESSALON) 200 MG capsule Take 1 capsule (200 mg total) by mouth 3 (three) times daily as needed for cough. 30 capsule 0  . betamethasone valerate (VALISONE) 0.1 % cream as needed.    . diltiazem (CARDIZEM CD) 240 MG 24 hr capsule Take 1 capsule by mouth daily.    Marland Kitchen. doxycycline (VIBRA-TABS) 100 MG tablet Take 2 tablets today, then 1 tablet daily 8 tablet 0  . EPINEPHrine (EPI-PEN) 0.3 mg/0.3 mL SOAJ injection Inject 0.3 mLs (0.3 mg total) into the muscle once. 1 Device 1  . LORazepam (ATIVAN) 0.5 MG tablet Take 0.5 mg by mouth every 8 (eight) hours as needed.    Marland Kitchen. olmesartan-hydrochlorothiazide (BENICAR HCT) 40-25 MG per tablet Take 1 tablet by mouth daily. 30 tablet 0  . potassium chloride SA (K-DUR,KLOR-CON) 20 MEQ tablet Take 20 meq twice a day 60 tablet 6  . sertraline (ZOLOFT) 50 MG tablet Take 50 mg by mouth daily.      Marland Kitchen. SYNTHROID 112 MCG tablet Take 112 mcg by mouth daily.  0  . warfarin (COUMADIN) 2 MG tablet Take 2 mg by mouth daily. As directed      No current facility-administered medications on file prior to visit.

## 2015-11-06 ENCOUNTER — Encounter: Payer: Self-pay | Admitting: Internal Medicine

## 2015-11-06 ENCOUNTER — Ambulatory Visit (INDEPENDENT_AMBULATORY_CARE_PROVIDER_SITE_OTHER): Payer: Medicare Other | Admitting: Internal Medicine

## 2015-11-06 ENCOUNTER — Ambulatory Visit (INDEPENDENT_AMBULATORY_CARE_PROVIDER_SITE_OTHER)
Admission: RE | Admit: 2015-11-06 | Discharge: 2015-11-06 | Disposition: A | Discharge status: Home / Self Care | Payer: Medicare Other | Source: Ambulatory Visit | Attending: Internal Medicine | Admitting: Internal Medicine

## 2015-11-06 VITALS — BP 116/74 | HR 61 | Ht 64.0 in | Wt 190.0 lb

## 2015-11-06 DIAGNOSIS — J328 Other chronic sinusitis: Secondary | ICD-10-CM | POA: Diagnosis not present

## 2015-11-06 DIAGNOSIS — I482 Chronic atrial fibrillation, unspecified: Secondary | ICD-10-CM

## 2015-11-06 DIAGNOSIS — K219 Gastro-esophageal reflux disease without esophagitis: Secondary | ICD-10-CM | POA: Diagnosis not present

## 2015-11-06 DIAGNOSIS — R05 Cough: Secondary | ICD-10-CM

## 2015-11-06 DIAGNOSIS — R059 Cough, unspecified: Secondary | ICD-10-CM

## 2015-11-06 NOTE — Assessment & Plan Note (Signed)
Good heart rate control without pacemaker

## 2015-11-06 NOTE — Progress Notes (Signed)
Subjective:    Patient ID: Vicki Morris, female    DOB: September 03, 1936, 80 y.o.   MRN: [id number]  HPI 80 yo F followed for allergic rhinitis and dyspnea, complicated by GERD, atrial fib.  Last here November 20, 2010. She wheezed a little last night after spreading mulch yesterday, but otherwise has done quite well considering the pollen season. She continues allergy vaccine here at 1:10. We discussed her vaccine, and meds.  05/03/12- 54 yo F followed for allergic rhinitis and dyspnea, complicated by GERD, atrial fib.  Widowed since last here- husband had metastatic lung cancer.  Cough in throat(tickle) gets worse at night and causes wheezing, Pt  is still getting allergy vaccine 1:10 and doing well on it. Ongoing reflux symptoms despite Prilosec. On an ACE inhibitor-we discussed potential for this to be the cause of her dry cough.  05/03/13- 86 yo F followed for allergic rhinitis and dyspnea, complicated by GERD, atrial fib FOLLOWS FOR: still on allergy vaccine 1:10 GH and doing well; Denies any flare ups at this time. Mild dry cough persists. CXR Neg last year. She and her PCP office realize this is likely from benazepril, but she doesn't mind it enough to change med.  Fewer sinus problems since husband (smoker) died. CXR 05/31/2012 IMPRESSION:  No acute findings appear  Original Report Authenticated By: Reyes Ivan, M.D.  06/21/13- 89 yo F followed for allergic rhinitis and dyspnea, complicated by GERD, atrial fib ACUTE VISIT: had eaten chedder cheese stick and lil debbie oatmeal cookie Sunday morning; lunch was hot dog with slaw, chili, and mustard; snack was cinnamon roll(homemade); Went to sleep and about an hour later woke with swollen/cracked lips and tongue swelling. Went to [employer] given Benadryl and prednisone.  Pt did pick up Epipen on Friday and did not use. On Benazepril HCT- discussed ACE inhibitors again. She she had 4 episodes: 2 months ago and 6 weeks ago the inside of her  mouth got sore. One week ago she woke with swollen tongue, right greater than left side. On another occasion she awoke from nap mouth sore and lips feeling swollen. Went to ER where she was treated with prednisone 40 mg daily. She questions if trigger might be cereal. No other rash or wheeze. Today feels well except tongue "tingle".  07/31/13- 35 yo F followed for allergic rhinitis and dyspnea, complicated by GERD, atrial fib FOLLOWS FOR: sitll on vaccine and doing well; would like to discuss labs and food allergies in detail. No longer dry cough or tongue swelling since switching from benazepril to Benicar. Continues allergy vaccine 1:10 GH. Feels well. Food IgE allergy profile 06/21/2013-Negative, total IgE 3.3. "Alpha-Gal" galactose-alpha-1, 3-galactose IgE elevated 1.14, significance unclear. Watch for reaction to meat. She had a total of 4 episodes of waking with tongue swelling. None recent. Has EpiPen and Benadryl. Discussed reaction to meat, preservatives in hot dogs?  01/30/14- 72 yo F followed for allergic rhinitis, angioedema and dyspnea, complicated by GERD, atrial fib FOLLOWS FOR:  Allergy Vaccine 1:10 GH doing well.  Allergies doing well some pressure in forehead area. We had called antibiotic and cleared her sinus infection since last here.Minor stuffiness lying down. Tongue has swollen again- most recently after grilled chicken cooked on same grill w/ beef- used Epipen. Can eat fish and chicken, consistent with elevated Alpha-Gal IgE documented last visit.  05-31-2014- 89 yo F followed for allergic rhinitis, food allergy-meat, angioedema and dyspnea, complicated by GERD, atrial fib FOLLOWS FOR: still on allergy vaccine  1:10 GH and doing well. Alpha-Gal 06/2013 was positive indicating potential IgE allergy to mammalian meat. She finds she can eat fish or chicken cooked on aluminum foil restaurant to avoid contact with other meats on the grill. Dry mouth and stuffy nose at  night.  11/05/14- 17 yo F followed for allergic rhinitis, food allergy-meat/ +alphaGal, angioedema and dyspnea, complicated by GERD, atrial fib FOLLOWS FOR: Still on Allergy vaccine 1:10 GH and doing well. She is doing better avoiding exposure to meat from mammals and brings in an article about alpha-Gal mediated allergy triggered by gelatin. She has been on allergy vaccine long enough and we are going to let her run out and stop for observation.  05/06/15- 49 yo F followed for allergic rhinitis, food allergy-meat/ +alphaGal, angioedema and dyspnea, complicated by GERD, atrial fib FOLLOWS FOR: Pt states she has had slight problems with allergies since stopping allergy vaccine last winter, but overall doing well.   11/06/2015-80 year old female never smoker followed for allergic rhinitis, food allergy-meat/+ alpha gal, angioedema, dyspnea, complicated by GERD, atrial fib Allergy vaccine 1:10 GH was stopped in August for observation FOLLOWS FOR: Pt states that allergies have been doing okay since last OV- reports having bronchitis a few times in the last 6 months. Some postnasal drip without sneezing. A couple of episodes of bronchitis over the fall season. Dry cough is worse lying down and probably began with minimal fever and malaise suggesting a viral syndrome initially. She admits having frequent reflux and sleeps on 2 pillows. Without Prilosec daily she has significant indigestion and has awakened choking and coughing.  ROS-see HPI Constitutional:   No-   weight loss, night sweats, fevers, chills, fatigue, lassitude. HEENT:   No-  headaches, difficulty swallowing, tooth/dental problems, sore throat,       No-  sneezing, itching, ear ache, +nasal congestion, post nasal drip,  CV:  No-   chest pain, orthopnea, PND, swelling in lower extremities, anasarca, dizziness, palpitations Resp: No-   shortness of breath with exertion or at rest.              No-   productive cough,  +non-productive cough,   No- coughing up of blood.              No-   change in color of mucus.  No- wheezing.   Skin: No-   rash or lesions. GI:  +  heartburn, indigestion, abdominal pain, nausea, vomiting,  GU: . MS:  No-   joint pain or swelling.   Neuro-     nothing unusual Psych:  No- change in mood or affect. no-  depression or anxiety.  No memory loss. no OBJ- Physical Exam General- Alert, Oriented, Affect-appropriate, Distress- none acute Skin- rash-none, lesions- none, excoriation- none Lymphadenopathy- none Head- atraumatic            Eyes- Gross vision intact, PERRLA, conjunctivae and secretions clear            Ears- Hearing, canals-normal            Nose- Clear, no-Septal dev, mucus, polyps- none, erosion, perforation             Throat- Mallampati II , mucosa clear + dry, drainage- none, tonsils- atrophic Neck- flexible , trachea midline, no stridor , thyroid nl, carotid no bruit Chest - symmetrical excursion , unlabored           Heart/CV- +almost regular AFib , no murmur , no gallop  , no rub, nl  s1 s2                           - JVD- none , edema+ trace, stasis changes- none, varices- none           Lung- clear to P&A, wheeze- none, cough- none , dullness-none, rub- none           Chest wall-  Abd-  Br/ Gen/ Rectal- Not done, not indicated Extrem- cyanosis- none, clubbing, none, atrophy- none, strength- nl Neuro- grossly intact to observation  Assessment & Plan:

## 2015-11-06 NOTE — Assessment & Plan Note (Signed)
There may have been some post bronchitis cough but most of this is reflux induced. Plan-elevate head of bed with bricks, chest x-ray

## 2015-11-06 NOTE — Assessment & Plan Note (Signed)
Some postnasal drip but without acute change, headache or purulent nasal discharge Consider antihistamines especially spring pollen season comes in. She is off of allergy vaccine now.

## 2015-11-06 NOTE — Assessment & Plan Note (Signed)
Plan-strong emphasis on reflux precautions

## 2015-11-06 NOTE — Patient Instructions (Addendum)
Order- CXR   Dx Cough  Suggest you raise the head of your bed frame by putting 1 brick under each of the head legs, to reduce reflux.

## 2015-11-21 ENCOUNTER — Telehealth: Payer: Self-pay | Admitting: Internal Medicine

## 2015-11-21 NOTE — Telephone Encounter (Signed)
Notes Recorded by Waymon Budge, MD on 11/06/2015 at 8:17 PM CXR- Mild old scarring with no new or active process. ---------------------- lmtcb X1 for pt.

## 2015-11-22 NOTE — Telephone Encounter (Signed)
Spoke with pt. She is aware of her results.

## 2015-11-22 NOTE — Telephone Encounter (Signed)
Pt returning call.Vicki Morris ° °

## 2015-11-22 NOTE — Telephone Encounter (Signed)
lmtcb X2 for pt.  

## 2016-05-05 ENCOUNTER — Encounter: Payer: Self-pay | Admitting: Internal Medicine

## 2016-05-05 ENCOUNTER — Ambulatory Visit (INDEPENDENT_AMBULATORY_CARE_PROVIDER_SITE_OTHER): Payer: Medicare Other | Admitting: Internal Medicine

## 2016-05-05 DIAGNOSIS — J330 Polyp of nasal cavity: Secondary | ICD-10-CM | POA: Diagnosis not present

## 2016-05-05 DIAGNOSIS — J328 Other chronic sinusitis: Secondary | ICD-10-CM | POA: Diagnosis not present

## 2016-05-05 DIAGNOSIS — J309 Allergic rhinitis, unspecified: Secondary | ICD-10-CM | POA: Diagnosis not present

## 2016-05-05 DIAGNOSIS — J3089 Other allergic rhinitis: Secondary | ICD-10-CM

## 2016-05-05 DIAGNOSIS — J302 Other seasonal allergic rhinitis: Secondary | ICD-10-CM

## 2016-05-05 MED ORDER — AMOXICILLIN-POT CLAVULANATE 875-125 MG PO TABS: 1.0000 | ORAL_TABLET | Freq: Two times a day (BID) | ORAL | 0 refills | Status: DC

## 2016-05-05 NOTE — Assessment & Plan Note (Signed)
No polyps are seen on anterior exam today

## 2016-05-05 NOTE — Assessment & Plan Note (Signed)
Incomplete resolution after initial course of antibiotic. Plan-Augmentin 10 days nasal nebulizer decongestant, use saline nasal rinse

## 2016-05-05 NOTE — Patient Instructions (Signed)
Script sent for augmentin antibiotic  Neb neo nasal  Acute maxillary sinusitis  If the antibiotic bothers your stomach, it may help to take an otc probiotic like Florastor or Align  Please call as needed

## 2016-05-05 NOTE — Progress Notes (Signed)
Subjective:    Patient ID: Vicki Morris, female    DOB: 04-29-1936, 80 y.o.   MRN: [id number]  HPI 80 yo F followed for allergic rhinitis and dyspnea, complicated by GERD, atrial fib.    07/31/13- 73 yo F followed for allergic rhinitis and dyspnea, complicated by GERD, atrial fib FOLLOWS FOR: sitll on vaccine and doing well; would like to discuss labs and food allergies in detail. No longer dry cough or tongue swelling since switching from benazepril to Benicar. Continues allergy vaccine 1:10 GH. Feels well. Food IgE allergy profile 06/21/2013-Negative, total IgE 3.3. "Alpha-Gal" galactose-alpha-1, 3-galactose IgE elevated 1.14, significance unclear. Watch for reaction to meat. She had a total of 4 episodes of waking with tongue swelling. None recent. Has EpiPen and Benadryl. Discussed reaction to meat, preservatives in hot dogs?  01/30/14- 81 yo F followed for allergic rhinitis, angioedema and dyspnea, complicated by GERD, atrial fib FOLLOWS FOR:  Allergy Vaccine 1:10 GH doing well.  Allergies doing well some pressure in forehead area. We had called antibiotic and cleared her sinus infection since last here.Minor stuffiness lying down. Tongue has swollen again- most recently after grilled chicken cooked on same grill w/ beef- used Epipen. Can eat fish and chicken, consistent with elevated Alpha-Gal IgE documented last visit.  05/04/14- 39 yo F followed for allergic rhinitis, food allergy-meat, angioedema and dyspnea, complicated by GERD, atrial fib FOLLOWS FOR: still on allergy vaccine 1:10 GH and doing well. Alpha-Gal 06/2013 was positive indicating potential IgE allergy to mammalian meat. She finds she can eat fish or chicken cooked on aluminum foil restaurant to avoid contact with other meats on the grill. Dry mouth and stuffy nose at night.  11/05/14- 8 yo F followed for allergic rhinitis, food allergy-meat/ +alphaGal, angioedema and dyspnea, complicated by GERD, atrial fib FOLLOWS  FOR: Still on Allergy vaccine 1:10 GH and doing well. She is doing better avoiding exposure to meat from mammals and brings in an article about alpha-Gal mediated allergy triggered by gelatin. She has been on allergy vaccine long enough and we are going to let her run out and stop for observation.  05/06/15- 78 yo F followed for allergic rhinitis, food allergy-meat/ +alphaGal, angioedema and dyspnea, complicated by GERD, atrial fib FOLLOWS FOR: Pt states she has had slight problems with allergies since stopping allergy vaccine last winter, but overall doing well.   11/06/2015-80 year old female never smoker followed for allergic rhinitis, food allergy-meat/+ alpha gal, angioedema, dyspnea, complicated by GERD, atrial fib Allergy vaccine 1:10 GH was stopped in August for observation FOLLOWS FOR: Pt states that allergies have been doing okay since last OV- reports having bronchitis a few times in the last 6 months. Some postnasal drip without sneezing. A couple of episodes of bronchitis over the fall season. Dry cough is worse lying down and probably began with minimal fever and malaise suggesting a viral syndrome initially. She admits having frequent reflux and sleeps on 2 pillows. Without Prilosec daily she has significant indigestion and has awakened choking and coughing.  05/05/2016-80 year old female never smoker followed for Allergic rhinitis, food allergy-meat/+ alpha gal, angioedema, dyspnea, complicated by GERD, A. Fib Off allergy vaccine since 05/2015 FOLLOWS FOR: Pt states she continues to cough; was at PCP recently-PCP sent pt to Dr Newman(ENT)-was told sinus infection and drainage-was put on steroid and abx as well. Pt states she got better then cough/drainage has come back-worse at night. Head congestion and postnasal drainage returned 3 weeks after clearing and response to one week of  unknown antibiotic and some prednisone which were taken for a tick bite and sinus infection. Chest feels  clear. She describes upper throat "croupy" cough that first began with bronchitis in February. Never productive. Also incidental wasp sting right lateral trunk which is still red.  ROS-see HPI Constitutional:   No-   weight loss, night sweats, fevers, chills, fatigue, lassitude. HEENT:   No-  headaches, difficulty swallowing, tooth/dental problems, sore throat,       No-  sneezing, itching, ear ache, +nasal congestion, post nasal drip,  CV:  No-   chest pain, orthopnea, PND, swelling in lower extremities, anasarca, dizziness, palpitations Resp: No-   shortness of breath with exertion or at rest.              No-   productive cough,  no-non-productive cough,  No- coughing up of blood.              No-   change in color of mucus.  No- wheezing.   Skin: + rash or lesions. GI:  +  heartburn, indigestion, abdominal pain, nausea, vomiting,  GU: . MS:  No-   joint pain or swelling.   Neuro-     nothing unusual Psych:  No- change in mood or affect. no-  depression or anxiety.  No memory loss. no OBJ- Physical Exam General- Alert, Oriented, Affect-appropriate, Distress- none acute, + overweight  Skin- rash-none, lesions- none, excoriation- none Lymphadenopathy- none Head- atraumatic            Eyes- Gross vision intact, PERRLA, conjunctivae and secretions clear            Ears- Hearing, canals-normal            Nose- + turbinate edema, no-Septal dev, mucus, polyps- none, erosion, perforation             Throat- Mallampati II , mucosa clear + dry, drainage- none, tonsils- atrophic Neck- flexible , trachea midline, no stridor , thyroid nl, carotid no bruit Chest - symmetrical excursion , unlabored           Heart/CV- +almost regular AFib , no murmur , no gallop  , no rub, nl s1 s2                           - JVD- none , edema-none, stasis changes- none, varices- none           Lung- clear to P&A, wheeze- none, cough- none , dullness-none, rub- none           Chest wall-  Abd-  Br/ Gen/ Rectal-  Not done, not indicated Extrem- cyanosis- none, clubbing, none, atrophy- none, strength- nl Neuro- grossly intact to observation  Assessment & Plan:

## 2016-05-05 NOTE — Assessment & Plan Note (Signed)
I think her current nasal complaints reflect a recurrence acute bilateral maxillary sinusitis which is part of a long-term problem for her. I don't think this is because she has been off of allergy vaccine.

## 2016-05-07 MED ORDER — PHENYLEPHRINE HCL 1 % NA SOLN
3.0000 [drp] | Freq: Once | NASAL | Status: AC
  Administered 2016-05-05: 3 [drp] via NASAL

## 2016-05-07 NOTE — Addendum Note (Signed)
Addended by: Reynaldo Minium C on: 05/07/2016 09:20 AM   Modules accepted: Orders

## 2016-10-12 NOTE — Progress Notes (Signed)
Vicki Morris Date of Birth: 10/17/1935 Medical Record #[id number]  History of Present Illness: Vicki Morris is seen for follow up AFib. She has PAF, GERD, hypothyroidism, HTN, HLD and a past TIA. She is on chronic coumadin.  A Nuclear stress test and Echo were done in June 2015 and were unremarkable.   On follow up today she states she just doesn't feel well at times. Gets some nausea. Fatigues more easily.  She denies any CP or SOB. No TIA symptoms. She had [facility] screening last year that showed mild to moderate carotid disease. Subsequent dedicated carotid dopplers showed no significant disease. She does have a mild cough. Saw ENT who thought it was more allergy related. She does have GERD.   Current Outpatient Prescriptions on File Prior to Visit  Medication Sig Dispense Refill  . amoxicillin-clavulanate (AUGMENTIN) 875-125 MG tablet Take 1 tablet by mouth 2 (two) times daily. 20 tablet 0  . benzonatate (TESSALON) 200 MG capsule Take 1 capsule (200 mg total) by mouth every 8 (eight) hours as needed for cough. 30 capsule 0  . betamethasone valerate (VALISONE) 0.1 % cream as needed.    . diltiazem (CARDIZEM CD) 240 MG 24 hr capsule Take 1 capsule by mouth daily.    Marland Kitchen. EPINEPHrine (EPI-PEN) 0.3 mg/0.3 mL SOAJ injection Inject 0.3 mLs (0.3 mg total) into the muscle once. 1 Device 1  . LORazepam (ATIVAN) 0.5 MG tablet Take 0.5 mg by mouth every 8 (eight) hours as needed.    Marland Kitchen. olmesartan-hydrochlorothiazide (BENICAR HCT) 40-25 MG per tablet Take 1 tablet by mouth daily. 30 tablet 0  . potassium chloride SA (K-DUR,KLOR-CON) 20 MEQ tablet Take 20 meq twice a day 60 tablet 6  . sertraline (ZOLOFT) 50 MG tablet Take 50 mg by mouth daily.      Marland Kitchen. SYNTHROID 112 MCG tablet Take 112 mcg by mouth daily.  0  . warfarin (COUMADIN) 2 MG tablet Take 2 mg by mouth daily. As directed      No current facility-administered medications on file prior to visit.     Allergies  Allergen Reactions  . Other  Anaphylaxis    Red meat, Pork, Beef, Lamb  . Sulfonamide Derivatives   . Clarithromycin Nausea And Vomiting  . Nabumetone Rash    Past Medical History:  Diagnosis Date  . Allergic rhinitis, cause unspecified   . Chronic anticoagulation   . GERD (gastroesophageal reflux disease)   . History of TIAs   . Hyperlipidemia   . Hypothyroidism   . Normal nuclear stress test March 2013   EF 64%, clinically and electrically negative for ischemia. Myoview scan with apical thinning, could not exclude small subendocardial scar. No ischemia.   . Obesity   . PAF (paroxysmal atrial fibrillation) (HCC)   . Polyp of nasal cavity     Past Surgical History:  Procedure Laterality Date  . CARPAL TUNNEL RELEASE    . NASAL SINUS SURGERY    . TOTAL ABDOMINAL HYSTERECTOMY W/ BILATERAL SALPINGOOPHORECTOMY    . US ECHOCARDIOGRAPHY  01/23/2010   EF 55-60%    History  Smoking Status  . Never Smoker  Smokeless Tobacco  . Never Used    History  Alcohol Use No    Family History  Problem Relation Age of Onset  . Heart disease Mother   . Heart disease Father   . Heart attack Father     Review of Systems: The review of systems is per the HPI.  All other systems  were reviewed and are negative.  Physical Exam: BP 130/70 (BP Location: Left Arm, Patient Position: Sitting, Cuff Size: Large)   Pulse 60   Wt 200 lb 9.6 oz (91 kg)   SpO2 96%   BMI 34.43 kg/m  Patient is very pleasant and in no acute distress. Skin is warm and dry. Color is normal.  HEENT is unremarkable. Normocephalic/atraumatic. PERRL. Sclera are nonicteric. Neck is supple. No masses. No JVD. Lungs are clear. Cardiac exam shows a regular rate and rhythm. Abdomen is soft. Extremities are without edema. Gait and ROM are intact. No gross neurologic deficits noted.  Lab data:  Dated 06/17/16: cholesterol 167, triglycerides 191, LDL 90, HDL 49. CMET normal.  Ecg today shows NSR with normal Ecg. I have personally reviewed and interpreted  this study.  Assessment / Plan: 1. HTN- now well controlled   2.  Atrial fibrillation. In NSR. Continue coumadin and diltiazem.  I will follow up in one year.

## 2016-10-13 ENCOUNTER — Ambulatory Visit (INDEPENDENT_AMBULATORY_CARE_PROVIDER_SITE_OTHER): Payer: Medicare Other | Admitting: Cardiology

## 2016-10-13 VITALS — BP 130/70 | HR 60 | Wt 200.6 lb

## 2016-10-13 DIAGNOSIS — I10 Essential (primary) hypertension: Secondary | ICD-10-CM

## 2016-10-13 DIAGNOSIS — I480 Paroxysmal atrial fibrillation: Secondary | ICD-10-CM

## 2016-10-13 NOTE — Patient Instructions (Signed)
Continue your current therapy  I will see you in one year   

## 2016-11-11 ENCOUNTER — Other Ambulatory Visit: Payer: Self-pay | Admitting: Obstetrics and Gynecology

## 2016-11-11 DIAGNOSIS — R928 Other abnormal and inconclusive findings on diagnostic imaging of breast: Secondary | ICD-10-CM

## 2016-11-13 ENCOUNTER — Ambulatory Visit
Admission: RE | Admit: 2016-11-13 | Discharge: 2016-11-13 | Disposition: A | Discharge status: Home / Self Care | Payer: Medicare Other | Source: Ambulatory Visit | Attending: Obstetrics and Gynecology | Admitting: Obstetrics and Gynecology

## 2016-11-13 DIAGNOSIS — R928 Other abnormal and inconclusive findings on diagnostic imaging of breast: Secondary | ICD-10-CM

## 2017-07-13 ENCOUNTER — Telehealth: Payer: Self-pay | Admitting: Cardiology

## 2017-07-13 NOTE — Telephone Encounter (Signed)
Received records from [facility] for appointment on 07/16/17 with Dr [place].  Records put with Dr Elvis Coil schedule for 07/16/17. lp

## 2017-07-14 NOTE — Progress Notes (Signed)
Vicki Morris Date of Birth: 09-04-1936 Medical Record #[id number]  History of Present Illness: Vicki Morris is seen for follow up AFib. She has PAF, GERD, hypothyroidism, HTN, HLD and a past TIA. She is on chronic coumadin.  A Nuclear stress test and Echo were done in June 2015 and were unremarkable.    On follow up today she states she just doesn't feel well at times. Gets some nausea with simple activity such as getting dressed or combing her hair. Feels weak and tired. Doesn't have the energy to keep up with her flowers.   She denies any CP or SOB. No TIA symptoms. Reports recent lab work showed mild anemia and elevated WBC. She has a UTI and bladder prolapse. Scheduled to see urology.   Current Outpatient Prescriptions on File Prior to Visit  Medication Sig Dispense Refill  . betamethasone valerate (VALISONE) 0.1 % cream as needed.    . diltiazem (CARDIZEM CD) 240 MG 24 hr capsule Take 1 capsule by mouth daily.    Marland Kitchen EPINEPHrine (EPI-PEN) 0.3 mg/0.3 mL SOAJ injection Inject 0.3 mLs (0.3 mg total) into the muscle once. 1 Device 1  . LORazepam (ATIVAN) 0.5 MG tablet Take 0.5 mg by mouth every 8 (eight) hours as needed.    Marland Kitchen olmesartan-hydrochlorothiazide (BENICAR HCT) 40-25 MG per tablet Take 1 tablet by mouth daily. 30 tablet 0  . omeprazole (PRILOSEC) 20 MG capsule Take 20 mg by mouth daily.  0  . rosuvastatin (CRESTOR) 10 MG tablet Take 10 mg by mouth daily.  0  . sertraline (ZOLOFT) 50 MG tablet Take 50 mg by mouth daily.      Marland Kitchen warfarin (COUMADIN) 2 MG tablet Take 2 mg by mouth daily. As directed      No current facility-administered medications on file prior to visit.     Allergies  Allergen Reactions  . Other Anaphylaxis    Red meat, Pork, Beef, Lamb  . Sulfonamide Derivatives   . Clarithromycin Nausea And Vomiting  . Nabumetone Rash    Past Medical History:  Diagnosis Date  . Allergic rhinitis, cause unspecified   . Chronic anticoagulation   . GERD (gastroesophageal  reflux disease)   . History of TIAs   . Hyperlipidemia   . Hypothyroidism   . Normal nuclear stress test March 2013   EF 64%, clinically and electrically negative for ischemia. Myoview scan with apical thinning, could not exclude small subendocardial scar. No ischemia.   . Obesity   . PAF (paroxysmal atrial fibrillation) (HCC)   . Polyp of nasal cavity     Past Surgical History:  Procedure Laterality Date  . CARPAL TUNNEL RELEASE    . NASAL SINUS SURGERY    . TOTAL ABDOMINAL HYSTERECTOMY W/ BILATERAL SALPINGOOPHORECTOMY    . US ECHOCARDIOGRAPHY  01/23/2010   EF 55-60%    History  Smoking Status  . Never Smoker  Smokeless Tobacco  . Never Used    History  Alcohol Use No    Family History  Problem Relation Age of Onset  . Heart disease Mother   . Heart disease Father   . Heart attack Father   . Breast cancer Paternal Aunt     Review of Systems: The review of systems is per the HPI.  All other systems were reviewed and are negative.  Physical Exam: BP (!) 145/81   Pulse (!) 59   Ht  (1.626 m)   Wt 193 lb 9.6 oz (87.8 kg)   BMI  33.23 kg/m  GENERAL:  Well appearing, overweight WF HEENT:  PERRL, EOMI, sclera are clear. Oropharynx is clear. NECK:  No jugular venous distention, carotid upstroke brisk and symmetric, no bruits, no thyromegaly or adenopathy LUNGS:  Clear to auscultation bilaterally CHEST:  Unremarkable HEART:  RRR,  PMI not displaced or sustained,S1 and S2 within normal limits, no S3, no S4: no clicks, no rubs, no murmurs ABD:  Soft, nontender. BS +, no masses or bruits. No hepatomegaly, no splenomegaly EXT:  2 + pulses throughout, no edema, no cyanosis no clubbing SKIN:  Warm and dry.  No rashes NEURO:  Alert and oriented x 3. Cranial nerves II through XII intact. PSYCH:  Cognitively intact     Lab data:  Dated 06/17/16: cholesterol 167, triglycerides 191, LDL 90, HDL 49. CMET normal. Dated 01/28/17: cholesterol 166, triglycerides 94, HDL 62,  LDL 85.  Dated 03/29/17: Normal CMET and CBC   Assessment / Plan: 1. HTN-  well controlled - continue current therapy  2.  Atrial fibrillation. In NSR. Continue coumadin and diltiazem.  3. UTI being treated.   I will tentatively follow up in one year. If symptoms persist despite treatment of medical issues would consider a stress test

## 2017-07-16 ENCOUNTER — Encounter: Payer: Self-pay | Admitting: Cardiology

## 2017-07-16 ENCOUNTER — Ambulatory Visit (INDEPENDENT_AMBULATORY_CARE_PROVIDER_SITE_OTHER): Payer: Medicare Other | Admitting: Cardiology

## 2017-07-16 VITALS — BP 145/81 | HR 59 | Ht 64.0 in | Wt 193.6 lb

## 2017-07-16 DIAGNOSIS — I480 Paroxysmal atrial fibrillation: Secondary | ICD-10-CM

## 2017-07-16 DIAGNOSIS — I10 Essential (primary) hypertension: Secondary | ICD-10-CM | POA: Diagnosis not present

## 2017-07-16 NOTE — Patient Instructions (Addendum)
Continue your current therapy  I will see you in one year   

## 2017-09-21 ENCOUNTER — Observation Stay (HOSPITAL_COMMUNITY)
Admission: EM | Admit: 2017-09-21 | Discharge: 2017-09-21 | Disposition: A | Discharge status: Home / Self Care | Payer: Medicare Other | Attending: Family Medicine | Admitting: Family Medicine

## 2017-09-21 ENCOUNTER — Other Ambulatory Visit: Payer: Self-pay | Admitting: Cardiology

## 2017-09-21 ENCOUNTER — Other Ambulatory Visit: Payer: Self-pay

## 2017-09-21 ENCOUNTER — Emergency Department (HOSPITAL_COMMUNITY): Payer: Medicare Other

## 2017-09-21 ENCOUNTER — Encounter (HOSPITAL_COMMUNITY): Payer: Self-pay | Admitting: Family Medicine

## 2017-09-21 ENCOUNTER — Observation Stay (HOSPITAL_BASED_OUTPATIENT_CLINIC_OR_DEPARTMENT_OTHER): Payer: Medicare Other

## 2017-09-21 DIAGNOSIS — E876 Hypokalemia: Secondary | ICD-10-CM | POA: Insufficient documentation

## 2017-09-21 DIAGNOSIS — R0789 Other chest pain: Secondary | ICD-10-CM | POA: Diagnosis not present

## 2017-09-21 DIAGNOSIS — I200 Unstable angina: Secondary | ICD-10-CM

## 2017-09-21 DIAGNOSIS — Z7901 Long term (current) use of anticoagulants: Secondary | ICD-10-CM | POA: Insufficient documentation

## 2017-09-21 DIAGNOSIS — Z79899 Other long term (current) drug therapy: Secondary | ICD-10-CM | POA: Insufficient documentation

## 2017-09-21 DIAGNOSIS — F419 Anxiety disorder, unspecified: Secondary | ICD-10-CM | POA: Insufficient documentation

## 2017-09-21 DIAGNOSIS — I10 Essential (primary) hypertension: Secondary | ICD-10-CM | POA: Insufficient documentation

## 2017-09-21 DIAGNOSIS — I700 Atherosclerosis of aorta: Secondary | ICD-10-CM | POA: Insufficient documentation

## 2017-09-21 DIAGNOSIS — E669 Obesity, unspecified: Secondary | ICD-10-CM | POA: Diagnosis not present

## 2017-09-21 DIAGNOSIS — Z6833 Body mass index (BMI) 33.0-33.9, adult: Secondary | ICD-10-CM | POA: Diagnosis not present

## 2017-09-21 DIAGNOSIS — R079 Chest pain, unspecified: Secondary | ICD-10-CM | POA: Diagnosis not present

## 2017-09-21 DIAGNOSIS — E785 Hyperlipidemia, unspecified: Secondary | ICD-10-CM | POA: Diagnosis not present

## 2017-09-21 DIAGNOSIS — E039 Hypothyroidism, unspecified: Secondary | ICD-10-CM | POA: Insufficient documentation

## 2017-09-21 DIAGNOSIS — I480 Paroxysmal atrial fibrillation: Secondary | ICD-10-CM | POA: Diagnosis not present

## 2017-09-21 DIAGNOSIS — Z882 Allergy status to sulfonamides status: Secondary | ICD-10-CM | POA: Diagnosis not present

## 2017-09-21 DIAGNOSIS — K219 Gastro-esophageal reflux disease without esophagitis: Secondary | ICD-10-CM | POA: Diagnosis not present

## 2017-09-21 DIAGNOSIS — I4891 Unspecified atrial fibrillation: Secondary | ICD-10-CM | POA: Diagnosis present

## 2017-09-21 DIAGNOSIS — Z8673 Personal history of transient ischemic attack (TIA), and cerebral infarction without residual deficits: Secondary | ICD-10-CM | POA: Diagnosis not present

## 2017-09-21 LAB — BASIC METABOLIC PANEL
ANION GAP: 9 (ref 5–15)
ANION GAP: 5 (ref 5–15)
BUN: 16 mg/dL (ref 6–20)
BUN: 12 mg/dL (ref 6–20)
CHLORIDE: 103 mmol/L (ref 101–111)
CO2: 27 mmol/L (ref 22–32)
CO2: 27 mmol/L (ref 22–32)
CREATININE: 0.69 mg/dL (ref 0.44–1.00)
Calcium: 9.1 mg/dL (ref 8.9–10.3)
Calcium: 8.7 mg/dL — ABNORMAL LOW (ref 8.9–10.3)
Chloride: 110 mmol/L (ref 101–111)
Creatinine, Ser: 0.63 mg/dL (ref 0.44–1.00)
GFR calc Af Amer: >60 mL/min (ref >60)
GFR calc Af Amer: >60 mL/min (ref >60)
GFR calc non Af Amer: >60 mL/min (ref >60)
GFR calc non Af Amer: >60 mL/min (ref >60)
GLUCOSE: 109 mg/dL — AB (ref 65–99)
Glucose, Bld: 157 mg/dL — ABNORMAL HIGH (ref 65–99)
POTASSIUM: 2.5 mmol/L — AB (ref 3.5–5.1)
POTASSIUM: 3.3 mmol/L — AB (ref 3.5–5.1)
Sodium: 139 mmol/L (ref 135–145)
Sodium: 142 mmol/L (ref 135–145)

## 2017-09-21 LAB — CBC
HCT: 35.3 % — ABNORMAL LOW (ref 36.0–46.0)
HEMOGLOBIN: 11.2 g/dL — AB (ref 12.0–15.0)
MCH: 26.5 pg (ref 26.0–34.0)
MCHC: 31.7 g/dL (ref 30.0–36.0)
MCV: 83.5 fL (ref 78.0–100.0)
PLATELETS: 256 10*3/uL (ref 150–400)
RBC: 4.23 MIL/uL (ref 3.87–5.11)
RDW: 14.3 % (ref 11.5–15.5)
WBC: 7.2 10*3/uL (ref 4.0–10.5)

## 2017-09-21 LAB — PROTIME-INR
INR: 1.86
PROTHROMBIN TIME: 21.3 s — AB (ref 11.4–15.2)

## 2017-09-21 LAB — ECHOCARDIOGRAM COMPLETE
HEIGHTINCHES: 64 in
Weight: 3104 oz

## 2017-09-21 LAB — MAGNESIUM: Magnesium: 2.2 mg/dL (ref 1.7–2.4)

## 2017-09-21 LAB — TROPONIN I: Troponin I: <0.03 ng/mL (ref <0.03)

## 2017-09-21 LAB — I-STAT TROPONIN, ED: TROPONIN I, POC: 0.01 ng/mL (ref 0.00–0.08)

## 2017-09-21 MED ORDER — LEVOTHYROXINE SODIUM 100 MCG PO TABS
100.0000 ug | ORAL_TABLET | Freq: Every day | ORAL | Status: DC
  Administered 2017-09-21: 100 ug via ORAL
  Filled 2017-09-21: qty 1

## 2017-09-21 MED ORDER — HYDRALAZINE HCL 20 MG/ML IJ SOLN: 10.0000 mg | INTRAMUSCULAR | Status: DC | PRN

## 2017-09-21 MED ORDER — HYDROCHLOROTHIAZIDE 12.5 MG PO CAPS: 12.5000 mg | ORAL_CAPSULE | Freq: Every day | ORAL | Status: DC

## 2017-09-21 MED ORDER — IRBESARTAN 150 MG PO TABS
150.0000 mg | ORAL_TABLET | Freq: Every day | ORAL | Status: DC
  Filled 2017-09-21: qty 1

## 2017-09-21 MED ORDER — DILTIAZEM HCL ER COATED BEADS 240 MG PO CP24: 240.0000 mg | ORAL_CAPSULE | Freq: Every day | ORAL | Status: DC

## 2017-09-21 MED ORDER — WARFARIN - PHARMACIST DOSING INPATIENT: Freq: Every day | Status: DC

## 2017-09-21 MED ORDER — SERTRALINE HCL 50 MG PO TABS: 50.0000 mg | ORAL_TABLET | Freq: Every day | ORAL | Status: DC

## 2017-09-21 MED ORDER — PANTOPRAZOLE SODIUM 40 MG PO TBEC
40.0000 mg | DELAYED_RELEASE_TABLET | Freq: Every day | ORAL | Status: DC
  Filled 2017-09-21: qty 1

## 2017-09-21 MED ORDER — POTASSIUM CHLORIDE 10 MEQ/100ML IV SOLN
10.0000 meq | Freq: Once | INTRAVENOUS | Status: AC
Start: 2017-09-21 — End: 2017-09-21
  Administered 2017-09-21: 10 meq via INTRAVENOUS
  Filled 2017-09-21: qty 100

## 2017-09-21 MED ORDER — POTASSIUM CHLORIDE CRYS ER 20 MEQ PO TBCR
40.0000 meq | EXTENDED_RELEASE_TABLET | Freq: Once | ORAL | Status: AC
  Administered 2017-09-21: 40 meq via ORAL
  Filled 2017-09-21: qty 2

## 2017-09-21 MED ORDER — POTASSIUM CHLORIDE CRYS ER 20 MEQ PO TBCR: 20.0000 meq | EXTENDED_RELEASE_TABLET | Freq: Every day | ORAL | 0 refills | Status: DC

## 2017-09-21 MED ORDER — ACETAMINOPHEN 325 MG PO TABS
650.0000 mg | ORAL_TABLET | ORAL | Status: DC | PRN
Start: 2017-09-21 — End: 2017-09-21

## 2017-09-21 MED ORDER — OLMESARTAN MEDOXOMIL-HCTZ 20-12.5 MG PO TABS: 1.0000 | ORAL_TABLET | Freq: Every day | ORAL | Status: DC

## 2017-09-21 MED ORDER — ONDANSETRON HCL 4 MG/2ML IJ SOLN: 4.0000 mg | Freq: Four times a day (QID) | INTRAMUSCULAR | Status: DC | PRN

## 2017-09-21 MED ORDER — ROSUVASTATIN CALCIUM 10 MG PO TABS: 10.0000 mg | ORAL_TABLET | Freq: Every day | ORAL | Status: DC

## 2017-09-21 MED ORDER — LORAZEPAM 0.5 MG PO TABS: 0.5000 mg | ORAL_TABLET | Freq: Every evening | ORAL | Status: DC | PRN

## 2017-09-21 MED ORDER — WARFARIN SODIUM 2 MG PO TABS
2.0000 mg | ORAL_TABLET | Freq: Once | ORAL | Status: DC
Start: 2017-09-21 — End: 2017-09-21
  Filled 2017-09-21: qty 1

## 2017-09-21 NOTE — ED Triage Notes (Signed)
Patient is complaining of mid sternal chest pain that radiates to her jaw. Pain started around 21:30 while sitting down. Patient started feeling palpitations and took her blood pressure. BP was elevated at home. Describes pain as intermittent. Other associated symptoms are back pain and weakness.

## 2017-09-21 NOTE — Discharge Instructions (Signed)
° °  You have a Stress Test scheduled at [facility]. Your doctor has ordered this test to check the blood flow in your heart arteries. ° °Please arrive 15 minutes early for paperwork. The whole test will take several hours. You may want to bring reading material to remain occupied while undergoing different parts of the test. ° °Instructions: °· No food/drink after midnight the night before. °· It is OK to take your morning meds with a sip of water EXCEPT for those types of medicines listed below or otherwise instructed. °· No caffeine/decaf products 24 hours before, including medicines such as Excedrin or Goody Powders. Call if there are any questions.  °· Wear comfortable clothes and shoes.  ° °Special Medication Instructions: °· Beta blockers such as metoprolol (Lopressor/Toprol XL), atenolol (Tenormin), carvedilol (Coreg), nebivolol (Bystolic), bisoprolol (Zebeta), propranolol (Inderal) should not be taken for 24 hours before the test. °· Calcium channel blockers such as diltiazem (Cardizem) or verapmil (Calan) should not be taken for 24 hours before the test. °· Remove nitroglycerin patches and do not take nitrate preparations such as Imdur/isosorbide the day of your test. °· No Persantine/Theophylline or Aggrenox medicines should be used within 24 hours of the test.  °· If you are diabetic, please ask which medications to hold the day of the test ° °What To Expect: °When you arrive in the lab, the technician will inject a small amount of radioactive tracer into your arm through an IV while you are resting quietly. This helps us to form pictures of your heart. You will likely only feel a sting from the IV. After a waiting period, resting pictures will be obtained under a big camera. These are the "before" pictures. ° °Next, you will be prepped for the stress portion of the test. This may include either walking on a treadmill or receiving a medicine that helps to dilate blood vessels in  your heart to simulate the effect of exercise on your heart. If you are walking on a treadmill, you will walk at different paces to try to get your heart rate to a goal number that is based on your age. If your doctor has chosen the pharmacologic test, then you will receive a medicine through your IV that may cause temporary nausea, flushing, shortness of breath and sometimes chest discomfort or vomiting. This is typically short-lived and usually resolves quickly. If you experience symptoms, that does not automatically mean the test is abnormal. Some patients do not experience any symptoms at all. Your blood pressure and heart rate will be monitored, and we will be watching your EKG on a computer screen for any changes. During this portion of the test, the radiologist will inject another small amount of radioactive tracer into your IV. After a waiting period, you will undergo a second set of pictures. These are the "after" pictures. ° °The doctor reading the test will compare the before-and-after images to look for evidence of heart blockages or heart weakness. The test usually takes 1 day to complete, but in certain instances (for example, if a patient is over a certain weight limit), the test may be done over the span of 2 days. ° ° °

## 2017-09-21 NOTE — H&P (Signed)
History and Physical    Vicki Morris ZOX:[id number] DOB: 03/18/1936 DOA: 09/21/2017  PCP: Merri BrunettePharr, Walter, MD  Patient coming from: Home.  Chief Complaint: Chest pain.  HPI: Vicki Morris is a 81 y.o. female with history of paroxysmal atrial fibrillation, hypertension, hypothyroidism, TIA presents to the ER after patient has been experiencing chest pain last night.  Patient chest pain is retrosternal sometime feeling like choking sensation in the neck.  Patient had associated palpitations.  Pain was lasting a few minutes each time and resolve without any intervention.  Recommend multiple times.  Denies any associated diaphoresis shortness of breath radiation.  Patient also check the blood pressure which was found to be elevated more than the usual.  Denies any abdominal pain nausea vomiting or diarrhea.  ED Course: In the ER EKG was unremarkable showing nonspecific findings chest x-ray was unremarkable troponin was negative patient was chest pain-free and patient admitted for further observation.  Review of Systems: As per HPI, rest all negative.   Past Medical History:  Diagnosis Date  . Allergic rhinitis, cause unspecified   . Chronic anticoagulation   . GERD (gastroesophageal reflux disease)   . History of TIAs   . Hyperlipidemia   . Hypothyroidism   . Normal nuclear stress test March 2013   EF 64%, clinically and electrically negative for ischemia. Myoview scan with apical thinning, could not exclude small subendocardial scar. No ischemia.   . Obesity   . PAF (paroxysmal atrial fibrillation) (HCC)   . Polyp of nasal cavity     Past Surgical History:  Procedure Laterality Date  . BLADDER SURGERY    . CARPAL TUNNEL RELEASE    . NASAL SINUS SURGERY    . TOTAL ABDOMINAL HYSTERECTOMY W/ BILATERAL SALPINGOOPHORECTOMY    . US ECHOCARDIOGRAPHY  01/23/2010   EF 55-60%     reports that  has never smoked. she has never used smokeless tobacco. She reports that she does not  drink alcohol or use drugs.  Allergies  Allergen Reactions  . Other Anaphylaxis    Red meat, Pork, Beef, Lamb  . Sulfonamide Derivatives   . Clarithromycin Nausea And Vomiting  . Nabumetone Rash    Family History  Problem Relation Age of Onset  . Heart disease Mother   . Heart disease Father   . Heart attack Father   . Breast cancer Paternal Aunt     Prior to Admission medications   Medication Sig Start Date End Date Taking? Authorizing Provider  betamethasone valerate (VALISONE) 0.1 % cream Apply 1 application topically daily. Apply to left foot 02/13/14  Yes [provider]  cholecalciferol (VITAMIN D) 1000 units tablet Take 1,000 Units by mouth daily.   Yes [provider]  diltiazem (CARDIZEM CD) 240 MG 24 hr capsule Take 240 mg by mouth at bedtime.  02/28/13  Yes [provider]  EPINEPHrine (EPI-PEN) 0.3 mg/0.3 mL SOAJ injection Inject 0.3 mLs (0.3 mg total) into the muscle once. 06/16/13  Yes Young, Joni Fearslinton D, MD  LORazepam (ATIVAN) 0.5 MG tablet Take 0.5 mg by mouth at bedtime as needed for sleep.    Yes [provider]  olmesartan-hydrochlorothiazide (BENICAR HCT) 20-12.5 MG tablet Take 1 tablet by mouth daily. 09/03/17  Yes [provider]  omeprazole (PRILOSEC) 20 MG capsule Take 20 mg by mouth daily after breakfast.  07/28/16  Yes [provider]  rosuvastatin (CRESTOR) 10 MG tablet Take 10 mg by mouth at bedtime.  09/26/16  Yes [provider]  sertraline (ZOLOFT) 50 MG tablet Take 50 mg by mouth at bedtime.    Yes [provider]  SYNTHROID 100 MCG tablet Take 100 mcg by mouth daily before breakfast.  07/12/17  Yes [provider]  warfarin (COUMADIN) 2 MG tablet Take 1-2 mg by mouth daily. Take 1 mg MWF, 2 mg on all other days   Yes [provider]  olmesartan-hydrochlorothiazide (BENICAR HCT) 40-25 MG per tablet Take 1 tablet by mouth daily. Patient not taking: Reported on 09/21/2017  06/24/15   [place], [name], MD    Physical Exam: Vitals:   09/21/17 0107 09/21/17 0237 09/21/17 0300 09/21/17 0330  BP:  (!) 174/80 (!) 166/70 (!) 164/71  Pulse:  60 (!) 56 (!) 50  Resp:  16 20 17   Temp:      TempSrc:      SpO2:  95% 98% 97%  Weight: 88 kg (194 lb)     Height: 5\' 4"  (1.626 m)         Constitutional: Moderately built and nourished. Vitals:   09/21/17 0107 09/21/17 0237 09/21/17 0300 09/21/17 0330  BP:  (!) 174/80 (!) 166/70 (!) 164/71  Pulse:  60 (!) 56 (!) 50  Resp:  16 20 17   Temp:      TempSrc:      SpO2:  95% 98% 97%  Weight: 88 kg (194 lb)     Height: 5\' 4"  (1.626 m)      Eyes: Anicteric no pallor. ENMT: No discharge from the ears eyes nose or mouth. Neck: No mass felt.  No JVD appreciated. Respiratory: No rhonchi or crepitations. Cardiovascular: S1-S2 heard no murmurs appreciated. Abdomen: Soft nontender bowel sounds present. Musculoskeletal: No edema.  No joint effusion. Skin: No rash.  Skin appears warm. Neurologic: Alert awake oriented to time place and person.  Moves all extremities 5 x 5. Psychiatric: Appears normal.  Normal affect.   Labs on Admission: I have personally reviewed following labs and imaging studies  CBC: Recent Labs  Lab 09/21/17 0144  WBC 7.2  HGB 11.2*  HCT 35.3*  MCV 83.5  PLT 256   Basic Metabolic Panel: Recent Labs  Lab 09/21/17 0144  NA 139  K 2.5*  CL 103  CO2 27  GLUCOSE 157*  BUN 16  CREATININE 0.69  CALCIUM 9.1  MG 2.2   GFR: Estimated Creatinine Clearance: 61.2 mL/min (by C-G formula based on SCr of 0.69 mg/dL). Liver Function Tests: No results for input(s): AST, ALT, ALKPHOS, BILITOT, PROT, ALBUMIN in the last 168 hours. No results for input(s): LIPASE, AMYLASE in the last 168 hours. No results for input(s): AMMONIA in the last 168 hours. Coagulation Profile: Recent Labs  Lab 09/21/17 0144  INR 1.86   Cardiac Enzymes: No results for input(s): CKTOTAL, CKMB, CKMBINDEX, TROPONINI in  the last 168 hours. BNP (last 3 results) No results for input(s): PROBNP in the last 8760 hours. HbA1C: No results for input(s): HGBA1C in the last 72 hours. CBG: No results for input(s): GLUCAP in the last 168 hours. Lipid Profile: No results for input(s): CHOL, HDL, LDLCALC, TRIG, CHOLHDL, LDLDIRECT in the last 72 hours. Thyroid Function Tests: No results for input(s): TSH, T4TOTAL, FREET4, T3FREE, THYROIDAB in the last 72 hours. Anemia Panel: No results for input(s): VITAMINB12, FOLATE, FERRITIN, TIBC, IRON, RETICCTPCT in the last 72 hours. Urine analysis:    Component Value Date/Time   COLORURINE YELLOW 04/27/2009 2037   APPEARANCEUR CLOUDY (A) 04/27/2009 2037  LABSPEC 1.008 04/27/2009 2037   PHURINE 7.0 04/27/2009 2037   GLUCOSEU NEGATIVE 04/27/2009 2037   HGBUR TRACE (A) 04/27/2009 2037   BILIRUBINUR NEGATIVE 04/27/2009 2037   KETONESUR NEGATIVE 04/27/2009 2037   PROTEINUR NEGATIVE 04/27/2009 2037   UROBILINOGEN 0.2 04/27/2009 2037   NITRITE POSITIVE (A) 04/27/2009 2037   LEUKOCYTESUR MODERATE (A) 04/27/2009 2037   Sepsis Labs: @LABRCNTIP (procalcitonin:4,lacticidven:4) )No results found for this or any previous visit (from the past 240 hour(s)).   Radiological Exams on Admission: Dg Chest 2 View  Result Date: 09/21/2017 CLINICAL DATA:  Initial evaluation for acute onset upper chest pain. EXAM: CHEST  2 VIEW COMPARISON:  Prior radiograph from 07/13/2017. FINDINGS: The cardiac and mediastinal silhouettes are stable in size and contour, and remain within normal limits. Aortic tortuosity with atherosclerosis noted, stable. The lungs are normally inflated. No airspace consolidation, pleural effusion, or pulmonary edema is identified. There is no pneumothorax. No acute osseous abnormality identified. IMPRESSION: 1. No active cardiopulmonary disease. 2. Aortic atherosclerosis. Electronically Signed   By: Rise MuBenjamin  McClintock M.D.   On: 09/21/2017 01:52    EKG: Independently  reviewed.  Normal sinus rhythm.  Assessment/Plan Principal Problem:   Chest pain Active Problems:   ATRIAL FIBRILLATION   Essential hypertension    1. Chest pain -cycle cardiac markers to rule out ACS.  Patient will be kept n.p.o. except medications in anticipation of possible cardiac procedure.  Cardiology has been notified.  Patient is on Coumadin statins and Cardizem. 2. Hypertension uncontrolled -likely contributing to patient's symptoms.  Patient is on ARB which will be continued along with Cardizem.  Will hold hydrochlorothiazide until patient's potassium is corrected.  PRN IV hydralazine for systolic blood pressure more than 160. 3. History of TIA on Coumadin and statins. 4. Paroxysmal atrial fibrillation on Cardizem and Coumadin. 5. Hypokalemia -will hold hydrochlorothiazide for now until we correct.  Follow metabolic panel. 6. Hyperlipidemia on statins.   DVT prophylaxis: Coumadin. Code Status: Full code. Family Communication: Discussed with patient. Disposition Plan: Home. Consults called: Cardiology. Admission status: Observation.   Eduard ClosArshad N Darnette Lampron MD [facility] Pager [phone].  If 7PM-7AM, please contact night-coverage [email or web address] Password [email or web address]  09/21/2017, 3:59 AM

## 2017-09-21 NOTE — ED Provider Notes (Signed)
[facility]-EMERGENCY DEPT Provider Note   CSN: [id number] Arrival date & time: 09/21/17  0040     History   Chief Complaint Chief Complaint  Patient presents with  . Chest Pain    HPI Vicki Morris is a 81 y.o. female.  The history is provided by the patient.  Chest Pain   This is a new problem. The current episode started 3 to 5 hours ago. The problem occurs constantly. The problem has been resolved. The quality of the pain is described as burning. Radiates to: neck. Pertinent negatives include no abdominal pain, no diaphoresis, no shortness of breath and no vomiting. Risk factors include being elderly.  Her past medical history is significant for hypertension.  Pertinent negatives for past medical history include no CAD.  Patient with history of paroxysmal A. fib, history of hypertension, presents with chest pain Reports onset of burning chest pain several hours ago, with associated feeling flushed, mild dizziness She is now improved, chest pain is resolved She has not had these pain episodes recently  Past Medical History:  Diagnosis Date  . Allergic rhinitis, cause unspecified   . Chronic anticoagulation   . GERD (gastroesophageal reflux disease)   . History of TIAs   . Hyperlipidemia   . Hypothyroidism   . Normal nuclear stress test March 2013   EF 64%, clinically and electrically negative for ischemia. Myoview scan with apical thinning, could not exclude small subendocardial scar. No ischemia.   . Obesity   . PAF (paroxysmal atrial fibrillation) (HCC)   . Polyp of nasal cavity     Patient Active Problem List   Diagnosis Date Noted  . Carotid arterial disease (HCC) 12/17/2014  . Essential hypertension 03/14/2014  . Food allergy 06/29/2013  . Hypokalemia 05/01/2012  . GERD 11/20/2010  . DYSPNEA 11/20/2010  . RHINOSINUSITIS, CHRONIC 04/28/2010  . HYPOTHYROIDISM 02/07/2009  . COUGH 02/07/2009  . ATRIAL FIBRILLATION 07/27/2007  . NASAL  POLYP 07/27/2007  . Seasonal and perennial allergic rhinitis 07/27/2007    Past Surgical History:  Procedure Laterality Date  . BLADDER SURGERY    . CARPAL TUNNEL RELEASE    . NASAL SINUS SURGERY    . TOTAL ABDOMINAL HYSTERECTOMY W/ BILATERAL SALPINGOOPHORECTOMY    . US ECHOCARDIOGRAPHY  01/23/2010   EF 55-60%    OB History    No data available       Home Medications    Prior to Admission medications   Medication Sig Start Date End Date Taking? Authorizing Provider  betamethasone valerate (VALISONE) 0.1 % cream as needed. 02/13/14   [provider]  cholecalciferol (VITAMIN D) 1000 units tablet Take 1,000 Units by mouth daily.    [provider]  diltiazem (CARDIZEM CD) 240 MG 24 hr capsule Take 1 capsule by mouth daily. 02/28/13   [provider]  EPINEPHrine (EPI-PEN) 0.3 mg/0.3 mL SOAJ injection Inject 0.3 mLs (0.3 mg total) into the muscle once. 06/16/13   [name], [name], MD  LORazepam (ATIVAN) 0.5 MG tablet Take 0.5 mg by mouth every 8 (eight) hours as needed.    [provider]  nitrofurantoin, macrocrystal-monohydrate, (MACROBID) 100 MG capsule Take 100 mg by mouth 2 (two) times daily.    [provider]  olmesartan-hydrochlorothiazide (BENICAR HCT) 40-25 MG per tablet Take 1 tablet by mouth daily. 06/24/15   SwazilandJordan, [name], MD  omeprazole (PRILOSEC) 20 MG capsule Take 20 mg by mouth daily. 07/28/16   [provider]  rosuvastatin (CRESTOR)  10 MG tablet Take 10 mg by mouth daily. 09/26/16   [provider]  sertraline (ZOLOFT) 50 MG tablet Take 50 mg by mouth daily.      [provider]  SYNTHROID 100 MCG tablet Take 100 mcg by mouth daily. 07/12/17   [provider]  warfarin (COUMADIN) 2 MG tablet Take 2 mg by mouth daily. As directed     [provider]    Family History Family History  Problem Relation Age of Onset  . Heart disease Mother   . Heart disease Father   . Heart  attack Father   . Breast cancer Paternal Aunt     Social History Social History   Tobacco Use  . Smoking status: Never Smoker  . Smokeless tobacco: Never Used  Substance Use Topics  . Alcohol use: No  . Drug use: No     Allergies   Other; Sulfonamide derivatives; Clarithromycin; and Nabumetone   Review of Systems Review of Systems  Constitutional: Negative for diaphoresis.  Respiratory: Negative for shortness of breath.   Cardiovascular: Positive for chest pain.  Gastrointestinal: Negative for abdominal pain and vomiting.  All other systems reviewed and are negative.    Physical Exam Updated Vital Signs BP (!) 174/80 (BP Location: Left Arm)   Pulse 60   Temp 97.6 F (36.4 C) (Oral)   Resp 16   Ht 1.626 m (5\' 4" )   Wt 88 kg (194 lb)   SpO2 95%   BMI 33.30 kg/m   Physical Exam CONSTITUTIONAL: Well developed/well nourished HEAD: Normocephalic/atraumatic EYES: EOMI/PERRL ENMT: Mucous membranes moist NECK: supple no meningeal signs SPINE/BACK:entire spine nontender CV: S1/S2 noted, no murmurs/rubs/gallops noted LUNGS: Lungs are clear to auscultation bilaterally, no apparent distress ABDOMEN: soft, nontender, no rebound or guarding, bowel sounds noted throughout abdomen GU:no cva tenderness NEURO: Pt is awake/alert/appropriate, moves all extremitiesx4.  No facial droop.   EXTREMITIES: pulses normal/equal, full ROM SKIN: warm, color normal PSYCH: no abnormalities of mood noted, alert and oriented to situation   ED Treatments / Results  Labs (all labs ordered are listed, but only abnormal results are displayed) Labs Reviewed  BASIC METABOLIC PANEL - Abnormal; Notable for the following components:      Result Value   Potassium 2.5 (*)    Glucose, Bld 157 (*)    All other components within normal limits  CBC - Abnormal; Notable for the following components:   Hemoglobin 11.2 (*)    HCT 35.3 (*)    All other components within normal limits  PROTIME-INR -  Abnormal; Notable for the following components:   Prothrombin Time 21.3 (*)    All other components within normal limits  MAGNESIUM  I-STAT TROPONIN, ED    EKG  EKG Interpretation  Date/Time:  Tuesday September 21 2017 01:01:18 EST Ventricular Rate:  63 PR Interval:    QRS Duration: 106 QT Interval:  517 QTC Calculation: 530 R Axis:   5 Text Interpretation:  Sinus rhythm Low voltage, precordial leads Borderline repolarization abnormality Prolonged QT interval Abnormal ekg Confirmed by Zadie RhineWickline, Harlowe Dowler ([id number]) on 09/21/2017 1:45:40 AM       Radiology Dg Chest 2 View  Result Date: 09/21/2017 CLINICAL DATA:  Initial evaluation for acute onset upper chest pain. EXAM: CHEST  2 VIEW COMPARISON:  Prior radiograph from 07/13/2017. FINDINGS: The cardiac and mediastinal silhouettes are stable in size and contour, and remain within normal limits. Aortic tortuosity with atherosclerosis noted, stable. The lungs are normally inflated. No airspace  consolidation, pleural effusion, or pulmonary edema is identified. There is no pneumothorax. No acute osseous abnormality identified. IMPRESSION: 1. No active cardiopulmonary disease. 2. Aortic atherosclerosis. Electronically Signed   By: Rise Mu M.D.   On: 09/21/2017 01:52    Procedures Procedures (including critical care time)  Medications Ordered in ED Medications  potassium chloride 10 mEq in 100 mL IVPB (10 mEq Intravenous New Bag/Given 09/21/17 0310)  potassium chloride SA (K-DUR,KLOR-CON) CR tablet 40 mEq (40 mEq Oral Given 09/21/17 0311)     Initial Impression / Assessment and Plan / ED Course  I have reviewed the triage vital signs and the nursing notes.  Pertinent labs & imaging results that were available during my care of the patient were reviewed by me and considered in my medical decision making (see chart for details).     We will admit, for chest pain rule out as well as replenishment of her hypokalemia To have  evidence of prolonged QT on EKG probably due to her hypokalemia 3:58 AM Discussed with Dr. Toniann Fail for admission Patient stable at this time Final Clinical Impressions(s) / ED Diagnoses   Final diagnoses:  Unstable angina [facility])  Hypokalemia    ED Discharge Orders    None       Zadie Rhine, MD 09/21/17 [phone]

## 2017-09-21 NOTE — Consult Note (Signed)
Cardiology Consultation:   Patient ID: Vicki MassedFrances C Llerena; [id number]; 09/13/1936   Admit date: 09/21/2017 Date of Consult: 09/21/2017  Primary Care Provider: Merri BrunettePharr, Walter, MD Primary Cardiologist: Dr. Peter SwazilandJordan   Patient Profile:   Vicki Morris is a 81 y.o. female with a hx of PAF on coumadin, HTN, hypothyroidism, TIA, GERD, HLD who is being seen today for the evaluation of Chest pain at the request of Dr. Jarvis NewcomerGrunz.  History of Present Illness:   [name] went to a Christmas party last night about 7pm.  After she was home at about 9:30 she developed an upper chest discomfort that went up towards her throat. She also noted an elevated BP in the 180's/84. The discomfort was vague, "felt like my heart just didn't feel right". She also had some palpitations and is unable to say if this felt like her afib in the past. Her discomfort was intermittent, lasting a few minutes at a time and no associated with dyspnea, lightheadedness, nausea, diaphoresis. After about 30 minutes she checked her BP again and it was 190/90. She became concerned and [facility]. She has had no chest discomfort since she arrived.   [name] notes that she has been getting fatigued and nauseated with activities like getting bathed and dressed in the morning. This usually only bothers her in the morning. At Thanksgiving she felt that she was needing to stop and rest more than usual while preparing the meal. She also notes that she has been having bladder issues and is seeing a urologist. She has had a CT and today was scheduled to have a scope look in her bladder.   [name] was last seen in our office on 07/16/2017 by Dr. SwazilandJordan at which time she was feeling weak and tired and having some nausea with activities. At the time she reported that she was recently diagnosed with mild anemia and a UTI. She was maintaining sinus rhythm. Dr SwazilandJordan noted that if her symptoms persisted a stress test would be  considered.   Pt had essentially normal echo and lexiscan myoview in 03/2014.   The patient does not smoke or drink alcohol.   Past Medical History:  Diagnosis Date  . Allergic rhinitis, cause unspecified   . Chronic anticoagulation   . GERD (gastroesophageal reflux disease)   . History of TIAs   . Hyperlipidemia   . Hypothyroidism   . Normal nuclear stress test March 2013   EF 64%, clinically and electrically negative for ischemia. Myoview scan with apical thinning, could not exclude small subendocardial scar. No ischemia.   . Obesity   . PAF (paroxysmal atrial fibrillation) (HCC)   . Polyp of nasal cavity     Past Surgical History:  Procedure Laterality Date  . BLADDER SURGERY    . CARPAL TUNNEL RELEASE    . NASAL SINUS SURGERY    . TOTAL ABDOMINAL HYSTERECTOMY W/ BILATERAL SALPINGOOPHORECTOMY    . US ECHOCARDIOGRAPHY  01/23/2010   EF 55-60%     Home Medications:  Prior to Admission medications   Medication Sig Start Date End Date Taking? Authorizing Provider  betamethasone valerate (VALISONE) 0.1 % cream Apply 1 application topically daily. Apply to left foot 02/13/14  Yes [provider]  cholecalciferol (VITAMIN D) 1000 units tablet Take 1,000 Units by mouth daily.   Yes [provider]  diltiazem (CARDIZEM CD) 240 MG 24 hr capsule Take 240 mg by mouth at bedtime.  02/28/13  Yes [provider]  EPINEPHrine (EPI-PEN) 0.3 mg/0.3 mL SOAJ injection Inject 0.3 mLs (0.3 mg total) into the muscle once. 06/16/13  Yes Young, Joni Fears D, MD  LORazepam (ATIVAN) 0.5 MG tablet Take 0.5 mg by mouth at bedtime as needed for sleep.    Yes [provider]  olmesartan-hydrochlorothiazide (BENICAR HCT) 20-12.5 MG tablet Take 1 tablet by mouth daily. 09/03/17  Yes [provider]  omeprazole (PRILOSEC) 20 MG capsule Take 20 mg by mouth daily after breakfast.  07/28/16  Yes [provider]  rosuvastatin (CRESTOR) 10 MG tablet Take 10 mg by  mouth at bedtime.  09/26/16  Yes [provider]  sertraline (ZOLOFT) 50 MG tablet Take 50 mg by mouth at bedtime.    Yes [provider]  SYNTHROID 100 MCG tablet Take 100 mcg by mouth daily before breakfast.  07/12/17  Yes [provider]  warfarin (COUMADIN) 2 MG tablet Take 1-2 mg by mouth daily. Take 1 mg MWF, 2 mg on all other days   Yes [provider]  olmesartan-hydrochlorothiazide (BENICAR HCT) 40-25 MG per tablet Take 1 tablet by mouth daily. Patient not taking: Reported on 09/21/2017 06/24/15   Swaziland, [name], MD    Inpatient Medications: Scheduled Meds: . diltiazem  240 mg Oral QHS  . irbesartan  150 mg Oral Daily  . levothyroxine  100 mcg Oral QAC breakfast  . pantoprazole  40 mg Oral Daily  . rosuvastatin  10 mg Oral QHS  . sertraline  50 mg Oral QHS  . warfarin  2 mg Oral ONCE-1800  . Warfarin - Pharmacist Dosing Inpatient   Does not apply q1800   Continuous Infusions:  PRN Meds: acetaminophen, hydrALAZINE, LORazepam, ondansetron (ZOFRAN) IV  Allergies:    Allergies  Allergen Reactions  . Other Anaphylaxis    Red meat, Pork, Beef, Lamb  . Sulfonamide Derivatives   . Clarithromycin Nausea And Vomiting  . Nabumetone Rash    Social History:   Social History   Socioeconomic History  . Marital status: Married    Spouse name: Not on file  . Number of children: Not on file  . Years of education: Not on file  . Highest education level: Not on file  Social Needs  . Financial resource strain: Not on file  . Food insecurity - worry: Not on file  . Food insecurity - inability: Not on file  . Transportation needs - medical: Not on file  . Transportation needs - non-medical: Not on file  Occupational History  . Occupation: Retired  Tobacco Use  . Smoking status: Never Smoker  . Smokeless tobacco: Never Used  Substance and Sexual Activity  . Alcohol use: No  . Drug use: No  . Sexual activity: No  Other Topics Concern  .  Not on file  Social History Narrative  . Not on file    Family History:    Family History  Problem Relation Age of Onset  . Heart disease Mother   . Heart disease Father   . Heart attack Father   . Breast cancer Paternal Aunt      ROS:  Please see the history of present illness.  ROS  All other ROS reviewed and negative.     Physical Exam/Data:   Vitals:   09/21/17 0530 09/21/17 0600 09/21/17 0630 09/21/17 0707  BP: (!) 152/61 (!) 142/68 136/68 134/64  Pulse:  (!) 55 (!) 50 (!) 50  Resp: 20 15 18 16   Temp:  TempSrc:      SpO2:  93% 93% 91%  Weight:      Height:        Intake/Output Summary (Last 24 hours) at 09/21/2017 0740 Last data filed at 09/21/2017 0410 Gross per 24 hour  Intake 100 ml  Output -  Net 100 ml   Filed Weights   09/21/17 0107  Weight: 194 lb (88 kg)   Body mass index is 33.3 kg/m.  General:  Well nourished, well developed, in no acute distress HEENT: normal Lymph: no adenopathy Neck: no JVD Endocrine:  No thryomegaly Vascular: No carotid bruits; FA pulses 2+ bilaterally without bruits  Cardiac:  normal S1, S2; RRR; no murmur  Lungs:  clear to auscultation bilaterally, no wheezing, rhonchi or rales  Abd: soft, nontender, no hepatomegaly  Ext: no edema Musculoskeletal:  No deformities, BUE and BLE strength normal and equal Skin: warm and dry  Neuro:  CNs 2-12 intact, no focal abnormalities noted Psych:  Normal affect   EKG:  The EKG was personally reviewed and demonstrates:  Sinus rhythm 63 bpm, Low voltage, precordial leads, Borderline repolarization abnormality, Prolonged QT interval Telemetry:  Telemetry was personally reviewed and demonstrates:  Sinus bradycardia in the 50's  Relevant CV Studies:  Echocardiogram 04/02/2014 Study Conclusions  - Left ventricle: The cavity size was normal. Wall thickness was normal. Systolic function was normal. The estimated ejection fraction was in the range of 55% to 60%. Wall motion  was normal; there were no regional wall motion abnormalities. - Left atrium: The atrium was mildly dilated.  Steffanie Dunn  03/28/2014 Impression Exercise Capacity:  Lexiscan with no exercise. BP Response:  Normal blood pressure response. Clinical Symptoms:  No significant symptoms noted. ECG Impression:  No significant ECG changes with Lexiscan. Comparison with Prior Nuclear Study: Prior study in 2013, suggestive of scar ?territory, EF 64%  Overall Impression:  Low risk stress nuclear study with apical/lateral breast attenuation artifacts.  LV Ejection Fraction: 62%.  LV Wall Motion:  NL LV Function; NL Wall Motion   Laboratory Data:  Chemistry Recent Labs  Lab 09/21/17 0144  NA 139  K 2.5*  CL 103  CO2 27  GLUCOSE 157*  BUN 16  CREATININE 0.69  CALCIUM 9.1  GFRNONAA >60  GFRAA >60  ANIONGAP 9    No results for input(s): PROT, ALBUMIN, AST, ALT, ALKPHOS, BILITOT in the last 168 hours. Hematology Recent Labs  Lab 09/21/17 0144  WBC 7.2  RBC 4.23  HGB 11.2*  HCT 35.3*  MCV 83.5  MCH 26.5  MCHC 31.7  RDW 14.3  PLT 256   Cardiac EnzymesNo results for input(s): TROPONINI in the last 168 hours.  Recent Labs  Lab 09/21/17 0154  TROPIPOC 0.01    BNPNo results for input(s): BNP, PROBNP in the last 168 hours.  DDimer No results for input(s): DDIMER in the last 168 hours.  Radiology/Studies:  Dg Chest 2 View  Result Date: 09/21/2017 CLINICAL DATA:  Initial evaluation for acute onset upper chest pain. EXAM: CHEST  2 VIEW COMPARISON:  Prior radiograph from 07/13/2017. FINDINGS: The cardiac and mediastinal silhouettes are stable in size and contour, and remain within normal limits. Aortic tortuosity with atherosclerosis noted, stable. The lungs are normally inflated. No airspace consolidation, pleural effusion, or pulmonary edema is identified. There is no pneumothorax. No acute osseous abnormality identified. IMPRESSION: 1. No active cardiopulmonary disease.  2. Aortic atherosclerosis. Electronically Signed   By: Rise Mu M.D.   On: 09/21/2017 01:52  Assessment and Plan:   1. Chest pain -Initial Troponin 0.01 -occurred in setting of uncontrolled hypertension -CXR shows no active cardiopulmonary disease, aortic atherosclerosis -EKG without ischemic changes, non-specific changes likely related to hypokalemia -The patient's discomfort is atypical, may be related to elevated blood pressure. Also could be related to an episode of afib, although she has had no evidence of afib while here. Also she is bradycardic and may be having feelings of fatigue related to low heart rate. GERD is also a differential as she had been at a holiday party earlier in the evening eating party snacks.  -So far there is no objective evidence of myocardial ischemia and the patient is chest pain free since arrival -Would check the second troponin and recheck serum potassium. If both are normal would plan for a stress test. The patient is feeling much better and would like to consider an outpatient stress test. Will discuss with Dr. Mayford Knifeurner.   2. Hypertension-uncontrolled -Home meds include olmesartan-HCTZ 20-12.5mg  daily -BP on admission was 183/87. Now improved at 134/64 without intervention. The patient has been compliant with her medications and says that her BP has been well controlled.   3. PAF -Controlled on Cardizem CD 240 mg, maintaining sinus rhythm -CHA2DS2/VAS Stroke Risk Score is 6 ( HTN, age (2), TIA (2), female)  And the patient is anticoagulated with coumadin for stroke risk reduction. INR is subtherapeutic at 1.86. Pharmacy to dose.   4. Hypokalemia -K+ 2.5,  SCr 0.69 -Was given potassium supplementation. Monitor labs.  -Likely related to HCTZ which is currently on hold until hypokalemia resolved.  -Will need potassium supplementation.    5. Hyperlipidemia -On crestor 10 mg -Followed by her PCP in [place], not in Epic  6. Hx of  GERD -continue PPI   For questions or updates, please contact [facility] Please consult [email or web address] for contact info under Cardiology/STEMI.   Signed, Berton BonJanine Maham Quintin, NP  09/21/2017 7:40 AM

## 2017-09-21 NOTE — Progress Notes (Signed)
ANTICOAGULATION CONSULT NOTE - Initial Consult  Pharmacy Consult for warfarin Indication: atrial fibrillation  Allergies  Allergen Reactions  . Other Anaphylaxis    Red meat, Pork, Beef, Lamb  . Sulfonamide Derivatives   . Clarithromycin Nausea And Vomiting  . Nabumetone Rash    Patient Measurements: Height: 5\' 4"  (162.6 cm) Weight: 194 lb (88 kg) IBW/kg (Calculated) : 54.7 Heparin Dosing Weight:   Vital Signs: Temp: 97.6 F (36.4 C) (12/18 0101) Temp Source: Oral (12/18 0101) BP: 164/71 (12/18 0330) Pulse Rate: 50 (12/18 0330)  Labs: Recent Labs    09/21/17 0144  HGB 11.2*  HCT 35.3*  PLT 256  LABPROT 21.3*  INR 1.86  CREATININE 0.69    Estimated Creatinine Clearance: 61.2 mL/min (by C-G formula based on SCr of 0.69 mg/dL).   Medical History: Past Medical History:  Diagnosis Date  . Allergic rhinitis, cause unspecified   . Chronic anticoagulation   . GERD (gastroesophageal reflux disease)   . History of TIAs   . Hyperlipidemia   . Hypothyroidism   . Normal nuclear stress test March 2013   EF 64%, clinically and electrically negative for ischemia. Myoview scan with apical thinning, could not exclude small subendocardial scar. No ischemia.   . Obesity   . PAF (paroxysmal atrial fibrillation) (HCC)   . Polyp of nasal cavity     Medications:   (Not in a hospital admission) Scheduled:  . diltiazem  240 mg Oral QHS  . levothyroxine  100 mcg Oral QAC breakfast  . olmesartan-hydrochlorothiazide  1 tablet Oral Daily  . pantoprazole  40 mg Oral Daily  . rosuvastatin  10 mg Oral QHS  . sertraline  50 mg Oral QHS  . warfarin  2 mg Oral ONCE-1800  . Warfarin - Pharmacist Dosing Inpatient   Does not apply q1800    Assessment: Patient with chronic warfarin use for afib.  INR < 2 on admit and last dose of warfarin noted 12/17 at 2200.  Home dose of warfarin noted on med rec.  Goal of Therapy:  INR 2-3    Plan:  Daily INR Warfarin 2mg  po x1 at  1800.  Darlina GuysGrimsley Jr, Jacquenette ShoneJulian Crowford 09/21/2017,4:11 AM

## 2017-09-21 NOTE — Progress Notes (Addendum)
BaKentuckyy EBelinda Fish5Dahlia Cl fie S<BA TTAG>yeLynnvillAdolph PollackwDonell Beers31Der1610ClaNew Augustaiborne RiggincesTePrinceVibra Long Term Acute Care Hospitals Bruinsukes Surgical Center Incs4SAdoREffie S [id number] RiggTePrincePremier Specialty Surgical Center LLCs Bruinsehabilitation pitalT>WinchesterAdolph PollacksDonell Beers insPioneer5Der1610ClaWaileaiborne RiggTePrinceExecutive Surge enter IKiesAd26Der1610ClaButleriborne RiggTePrinceChristus Dubuis Hospital Of Houstons Bruinso EhrlichTEXFortune BrandsTTPrincess KentuckyBruinsEverlene FarriertGenia Delms

## 2017-09-21 NOTE — ED Notes (Signed)
ED Provider at bedside. 

## 2017-09-21 NOTE — Progress Notes (Signed)
  Echocardiogram 2D Echocardiogram has been performed.  Roosvelt MaserLane, Ainara Eldridge F 09/21/2017, 1:45 PM

## 2017-09-21 NOTE — Discharge Summary (Signed)
Physician Discharge Summary  Vicki MassedFrances C Muse RUE:[id number] DOB: 04/28/1936 DOA: 09/21/2017  PCP: Merri BrunettePharr, Walter, MD  Admit date: 09/21/2017 Discharge date: 09/21/2017  Admitted From: Home Disposition: Home   Recommendations for Outpatient Follow-up:  1. Follow up with cardiology for outpatient stress testing, to be arranged through cardiology.  2. Monitor BMP and blood pressure. Started daily potassium supplement due to hypokalemia and ongoing HCTZ use.   Home Health: None Equipment/Devices: None Discharge Condition: Stable CODE STATUS: Full Diet recommendation: Heart healthy  Brief/Interim Summary: Vicki Morris is a 81 y.o. female with a history of GERD and AFib placed in observation earlier this morning for ACS rule out. She presented for left-mid chest "burning" pain radiating to the throat that came on while resting in a recliner, and continued intermittently lasting a few minutes at a time. She noted some sensation of missing heart beats and reported higher-than-usual blood pressures. Symptoms were not worse with exertion, not associated with dyspnea, diaphoresis, N/V, and no medications were tried. She reports a negative nuclear stress test about 3 years ago per her cardiologist, Dr. SwazilandJordan. Troponins remained negative and the chest pain had subsided by the time of evaluation, and has remained resolved. Potassium has been supplemented and the patient was evaluated by cardiology, who recommends outpatient stress testing which will be arranged, per Dr. Norris Crossurner's note 12/18.   Discharge Diagnoses:  Principal Problem:   Chest pain Active Problems:   ATRIAL FIBRILLATION   Essential hypertension  Chest pain: resolved, atypical. More consistent with reflux symptoms. Nuclear stress testing June 2015 showed normal EF and no ischemic changes. - She is on statin, troponin negative x2. ECG personally reviewed, showing NSR w/vent rate of 63, fine artifact throughout but definitely see P  waves. QTc 530msec. No ischemic changes.  - Cardiology evaluated pt, Dr. Mayford Knifeurner recommendations: "Trop negative x 2 and EKG with no acute ST changes.  Sx likely related to poorly controlled BP secondary to increase sodium intake vs. GERD.  BP much improved now.  She has had some problems with intermittent fatigue and at last OV Dr. SwazilandJordan recommended stress testing if this continued.  This is her first episode CP.  I think she can be discharged home and we will set up outpt nuclear stress test for later this week.  Potassium needs to be repleted by primary team and recommended daily potassium suppl as she is on diuretic.  She has been instructed to call if she has any reoccurence of her sx." - Continue PPI for chronic GERD, consider GI follow up.  Hypokalemia: Suspect due to HCTZ, as she's not had poor oral intake or GI losses. - Replacement started, continue 20mEq daily until follow up and recheck BMP.   HTN: Initially elevated above baseline due to pain/anxiety per pt.  - Continue home medications, suspect spike in pressures due to high salt load at Christmas party.   Paroxysmal AFib: Currently NSR - Continue coumadin - Continue diltiazem  Discharge Instructions Discharge Instructions    Diet - low sodium heart healthy   Complete by:  As directed    Discharge instructions   Complete by:  As directed    You were placed in observation for chest pain thought to be due to high blood pressure. Fortunately, there has been no evidence of damage to your heart, and you are stable for discharge home. Cardiology has evaluated you, recommending a stress test as an outpatient. You will be contacted to schedule this.  - Continue taking medications  as you were - Start taking a potassium supplement daily (start tomorrow), this was sent to your pharmacy.  - Seek medical attention if your symptoms return.   Increase activity slowly   Complete by:  As directed      Allergies as of 09/21/2017       Reactions   Other Anaphylaxis   Red meat, Pork, Beef, Lamb   Sulfonamide Derivatives    Clarithromycin Nausea And Vomiting   Nabumetone Rash      Medication List    TAKE these medications   betamethasone valerate 0.1 % cream Commonly known as:  VALISONE Apply 1 application topically daily. Apply to left foot   cholecalciferol 1000 units tablet Commonly known as:  VITAMIN D Take 1,000 Units by mouth daily.   diltiazem 240 MG 24 hr capsule Commonly known as:  CARDIZEM CD Take 240 mg by mouth at bedtime.   EPINEPHrine 0.3 mg/0.3 mL Soaj injection Commonly known as:  EPI-PEN Inject 0.3 mLs (0.3 mg total) into the muscle once.   LORazepam 0.5 MG tablet Commonly known as:  ATIVAN Take 0.5 mg by mouth at bedtime as needed for sleep.   olmesartan-hydrochlorothiazide 20-12.5 MG tablet Commonly known as:  BENICAR HCT Take 1 tablet by mouth daily. What changed:  Another medication with the same name was removed. Continue taking this medication, and follow the directions you see here.   omeprazole 20 MG capsule Commonly known as:  PRILOSEC Take 20 mg by mouth daily after breakfast.   potassium chloride SA 20 MEQ tablet Commonly known as:  K-DUR,KLOR-CON Take 1 tablet (20 mEq total) by mouth daily.   rosuvastatin 10 MG tablet Commonly known as:  CRESTOR Take 10 mg by mouth at bedtime.   sertraline 50 MG tablet Commonly known as:  ZOLOFT Take 50 mg by mouth at bedtime.   SYNTHROID 100 MCG tablet Generic drug:  levothyroxine Take 100 mcg by mouth daily before breakfast.   warfarin 2 MG tablet Commonly known as:  COUMADIN Take 1-2 mg by mouth daily. Take 1 mg MWF, 2 mg on all other days      Follow-up Information    Swaziland, [name], MD Follow up.   Specialty:  Cardiology Why:  You will be scheduled for a nuclear stress test. The office will call you to schedule. You will then follow up with Dr. Swaziland or his PA.  Please see the attached instructions.  Contact  information: [address] [place] [address] [phone]        Merri Brunette, MD Follow up.   Specialty:  Internal Medicine Contact information: [address] [place] [address] [place] [address] [phone]          Allergies  Allergen Reactions  . Other Anaphylaxis    Red meat, Pork, Beef, Lamb  . Sulfonamide Derivatives   . Clarithromycin Nausea And Vomiting  . Nabumetone Rash    Consultations:  Cardiology  Procedures/Studies: Dg Chest 2 View  Result Date: 09/21/2017 CLINICAL DATA:  Initial evaluation for acute onset upper chest pain. EXAM: CHEST  2 VIEW COMPARISON:  Prior radiograph from 07/13/2017. FINDINGS: The cardiac and mediastinal silhouettes are stable in size and contour, and remain within normal limits. Aortic tortuosity with atherosclerosis noted, stable. The lungs are normally inflated. No airspace consolidation, pleural effusion, or pulmonary edema is identified. There is no pneumothorax. No acute osseous abnormality identified. IMPRESSION: 1. No active cardiopulmonary disease. 2. Aortic atherosclerosis. Electronically Signed   By: Sharlet Salina  Phill MyronMcClintock M.D.   On: 09/21/2017 01:52    Echocardiogram pending.  Subjective: No chest pain, wants to go home.   Discharge Exam: Vitals:   09/21/17 1030 09/21/17 1325  BP: (!) 148/68 (!) 146/66  Pulse: (!) 52 (!) 56  Resp: 19 19  Temp:    SpO2: 93% 92%   Gen: Elderly, pleasant female in no distress Pulm: Clear and nonlabored on room air  CV: RRR, no murmur, no JVD, no edema GI: Soft, NT, ND, +BS  Neuro: Alert and oriented. No focal deficits. Skin: No rashes, lesions no ulcers  Labs: Basic Metabolic Panel: Recent Labs  Lab 09/21/17 0144 09/21/17 1037  NA 139 142  K 2.5* 3.3*  CL 103 110  CO2 27 27  GLUCOSE 157* 109*  BUN 16 12  CREATININE 0.69 0.63  CALCIUM 9.1 8.7*  MG 2.2  --    CBC: Recent Labs  Lab 09/21/17 0144  WBC 7.2  HGB 11.2*  HCT 35.3*  MCV 83.5   PLT 256   Cardiac Enzymes: Recent Labs  Lab 09/21/17 1037  TROPONINI <0.03    Time coordinating discharge: Approximately 40 minutes  Hazeline Junkeryan Murdock Jellison, MD  [facility] 09/21/2017, 1:49 PM Pager [phone]

## 2017-09-21 NOTE — ED Notes (Signed)
Echocardiogram at bedside.

## 2017-09-23 ENCOUNTER — Telehealth (HOSPITAL_COMMUNITY): Payer: Self-pay

## 2017-09-23 NOTE — Telephone Encounter (Signed)
Encounter complete. 

## 2017-09-30 ENCOUNTER — Ambulatory Visit (HOSPITAL_COMMUNITY)
Admission: RE | Admit: 2017-09-30 | Discharge: 2017-09-30 | Disposition: A | Discharge status: Home / Self Care | Payer: Medicare Other | Source: Ambulatory Visit | Attending: Cardiology | Admitting: Cardiology

## 2017-09-30 DIAGNOSIS — R002 Palpitations: Secondary | ICD-10-CM | POA: Insufficient documentation

## 2017-09-30 DIAGNOSIS — R9439 Abnormal result of other cardiovascular function study: Secondary | ICD-10-CM | POA: Diagnosis not present

## 2017-09-30 DIAGNOSIS — R0609 Other forms of dyspnea: Secondary | ICD-10-CM | POA: Insufficient documentation

## 2017-09-30 DIAGNOSIS — R5383 Other fatigue: Secondary | ICD-10-CM | POA: Diagnosis not present

## 2017-09-30 DIAGNOSIS — E039 Hypothyroidism, unspecified: Secondary | ICD-10-CM | POA: Insufficient documentation

## 2017-09-30 DIAGNOSIS — Z6833 Body mass index (BMI) 33.0-33.9, adult: Secondary | ICD-10-CM | POA: Insufficient documentation

## 2017-09-30 DIAGNOSIS — E663 Overweight: Secondary | ICD-10-CM | POA: Diagnosis not present

## 2017-09-30 DIAGNOSIS — R42 Dizziness and giddiness: Secondary | ICD-10-CM | POA: Diagnosis not present

## 2017-09-30 DIAGNOSIS — I480 Paroxysmal atrial fibrillation: Secondary | ICD-10-CM | POA: Insufficient documentation

## 2017-09-30 DIAGNOSIS — Z8249 Family history of ischemic heart disease and other diseases of the circulatory system: Secondary | ICD-10-CM | POA: Diagnosis not present

## 2017-09-30 DIAGNOSIS — Z8673 Personal history of transient ischemic attack (TIA), and cerebral infarction without residual deficits: Secondary | ICD-10-CM | POA: Insufficient documentation

## 2017-09-30 DIAGNOSIS — I10 Essential (primary) hypertension: Secondary | ICD-10-CM | POA: Insufficient documentation

## 2017-09-30 DIAGNOSIS — R0789 Other chest pain: Secondary | ICD-10-CM | POA: Diagnosis not present

## 2017-09-30 LAB — MYOCARDIAL PERFUSION IMAGING
CHL CUP NUCLEAR SDS: 2
CHL CUP NUCLEAR SRS: 4
CSEPPHR: 87 {beats}/min
LV dias vol: 122 mL (ref 46–106)
LV sys vol: 59 mL
Rest HR: 61 {beats}/min
SSS: 6
TID: 1.14

## 2017-09-30 MED ORDER — TECHNETIUM TC 99M TETROFOSMIN IV KIT
10.8000 | PACK | Freq: Once | INTRAVENOUS | Status: AC | PRN
  Administered 2017-09-30: 10.8 via INTRAVENOUS
  Filled 2017-09-30: qty 11

## 2017-09-30 MED ORDER — REGADENOSON 0.4 MG/5ML IV SOLN
0.4000 mg | Freq: Once | INTRAVENOUS | Status: AC
  Administered 2017-09-30: 0.4 mg via INTRAVENOUS

## 2017-09-30 MED ORDER — TECHNETIUM TC 99M TETROFOSMIN IV KIT
32.1000 | PACK | Freq: Once | INTRAVENOUS | Status: AC | PRN
  Administered 2017-09-30: 32.1 via INTRAVENOUS
  Filled 2017-09-30: qty 33

## 2017-10-07 ENCOUNTER — Ambulatory Visit: Payer: Medicare Other | Admitting: Physician Assistant

## 2017-10-07 ENCOUNTER — Encounter: Payer: Self-pay | Admitting: Physician Assistant

## 2017-10-07 VITALS — BP 116/76 | HR 58 | Ht 64.0 in | Wt 197.0 lb

## 2017-10-07 DIAGNOSIS — I10 Essential (primary) hypertension: Secondary | ICD-10-CM

## 2017-10-07 DIAGNOSIS — R079 Chest pain, unspecified: Secondary | ICD-10-CM

## 2017-10-07 DIAGNOSIS — E876 Hypokalemia: Secondary | ICD-10-CM | POA: Diagnosis not present

## 2017-10-07 DIAGNOSIS — I480 Paroxysmal atrial fibrillation: Secondary | ICD-10-CM

## 2017-10-07 NOTE — Progress Notes (Signed)
Cardiology Office Note   Date:  10/07/2017   ID:  CHARMAYNE ODELL, DOB [birth year], MRN [id number]  PCP:  Merri Brunette, MD  Cardiologist: Dr. Swaziland, 07/16/2017 Theodore Demark, PA-C   Chief Complaint  Patient presents with  . Follow-up    pt states since ED she had one elevated BP of 197/94--was up moving and happened when she bent over--has not happened again. C/o occasional SOB; mild swelling legs/feet that goes down at night; and random nausea--had CT scan of abdomen but has not heard results (not in chart)    History of Present Illness: TEIRRA Morris is a 82 y.o. female with a history of  PAF, GERD, hypothyroidism, HTN, HLD and a past TIA on chronic coumadin.   10/12 office visit, patient describing nausea with exertion, weak and tired, patient being treated for UTI, consider stress test if symptoms persist after UTI treated. 12/27 stress test with possibly reversible defect felt related to attenuation, EF 51%   Vicki Morris presents for cardiology follow up.  She has palpitations off and on all the time. She does not have presyncope or syncope.  She went to the ER 12/18 for chest pain and HTN, her K+ was 2.5 and was supplemented. She takes oral rx now.  She bent over and was exerting herself. She felt her heart start to skip and felt her BP go up. Her BP was 197/94, her heart and BP improved in about 30 minutes. This does not happen often, feels it was likely due to the bending over with exertion.  The chest pain was L and was a burning. She has not had it since.   She works in her flowers during the summer, does not do much during the winter. She has [employer] on her insurance, was exercising at one point, but stopped.   She has not had chest pain with walking, she just feels her heart speed up when she bends over.  She admits that she does not take care of herself as she should.  She could eat a heart healthy diet, but frequently does not cook healthy  because she is cooking for 1.  She tries to keep herself busy doing things at the house, but they are all done while she is sitting.  She is still mourning the death of her husband 6 years ago.   Past Medical History:  Diagnosis Date  . Allergic rhinitis, cause unspecified   . Chronic anticoagulation   . GERD (gastroesophageal reflux disease)   . History of TIAs   . Hyperlipidemia   . Hypothyroidism   . Normal nuclear stress test March 2013   EF 64%, clinically and electrically negative for ischemia. Myoview scan with apical thinning, could not exclude small subendocardial scar. No ischemia.   . Obesity   . PAF (paroxysmal atrial fibrillation) (H[facility])   . Polyp of nasal cavity     Past Surgical History:  Procedure Laterality Date  . BLADDER SURGERY    . CARPAL TUNNEL RELEASE    . NASAL SINUS SURGERY    . TOTAL ABDOMINAL HYSTERECTOMY W/ BILATERAL SALPINGOOPHORECTOMY    . US ECHOCARDIOGRAPHY  01/23/2010   EF 55-60%    Current Outpatient Medications  Medication Sig Dispense Refill  . betamethasone valerate (VALISONE) 0.1 % cream Apply 1 application topically daily. Apply to left foot    . cholecalciferol (VITAMIN D) 1000 units tablet Take 1,000 Units by mouth daily.    Marland Kitchen diltiazem (CARDIZEM CD)  240 MG 24 hr capsule Take 240 mg by mouth at bedtime.     Marland Kitchen. EPINEPHrine (EPI-PEN) 0.3 mg/0.3 mL SOAJ injection Inject 0.3 mLs (0.3 mg total) into the muscle once. 1 Device 1  . LORazepam (ATIVAN) 0.5 MG tablet Take 0.5 mg by mouth at bedtime as needed for sleep.     Marland Kitchen. olmesartan-hydrochlorothiazide (BENICAR HCT) 20-12.5 MG tablet Take 1 tablet by mouth daily.  0  . omeprazole (PRILOSEC) 20 MG capsule Take 20 mg by mouth daily after breakfast.   0  . potassium chloride SA (K-DUR,KLOR-CON) 20 MEQ tablet Take 1 tablet (20 mEq total) by mouth daily. 30 tablet 0  . rosuvastatin (CRESTOR) 10 MG tablet Take 10 mg by mouth at bedtime.   0  . sertraline (ZOLOFT) 50 MG tablet Take 50 mg by mouth at  bedtime.     Marland Kitchen. SYNTHROID 100 MCG tablet Take 100 mcg by mouth daily before breakfast.   0  . warfarin (COUMADIN) 2 MG tablet Take 1-2 mg by mouth daily. Take 1 mg MWF, 2 mg on all other days     No current facility-administered medications for this visit.     Allergies:   Other; Sulfonamide derivatives; Clarithromycin; and Nabumetone    Social History:  The patient  reports that  has never smoked. she has never used smokeless tobacco. She reports that she does not drink alcohol or use drugs.   Family History:  The patient's family history includes Breast cancer in her paternal aunt; Heart attack in her father; Heart disease in her father and mother.    ROS:  Please see the history of present illness. All other systems are reviewed and negative.    PHYSICAL EXAM: VS:  BP 116/76 (BP Location: Left Arm, Patient Position: Sitting, Cuff Size: Normal)   Pulse (!) 58   Ht 5\' 4"  (1.626 m)   Wt 197 lb (89.4 kg)   BMI 33.81 kg/m  , BMI Body mass index is 33.81 kg/m. GEN: Well nourished, well developed, female in no acute distress  HEENT: normal for age  Neck: no JVD, no carotid bruit, no masses Cardiac: RRR; soft murmur, no rubs, or gallops Respiratory:  clear to auscultation bilaterally, normal work of breathing GI: soft, nontender, nondistended, + BS MS: no deformity or atrophy; no edema; distal pulses are 2+ in all 4 extremities   Skin: warm and dry, no rash Neuro:  Strength and sensation are intact Psych: euthymic mood, full affect   EKG:  EKG is ordered today.  Sinus bradycardia, heart rate 58, normal intervals, no acute ischemic changes  MYOVIEW: 09/30/2017  The left ventricular ejection fraction is mildly decreased (45-54%).  Nuclear stress EF: 51%.  There was no ST segment deviation noted during stress.  There is a small defect of mild severity present in the basal inferolateral location. The defect is partially reversible and likely related to variations in  diaphragmatic attenuation artifact. There is a small defect of moderate severity present in the apex location. The defect is non-reversible and in setting of normal LVF is consistent with attenuation artifact. No ischemia noted.  This is a low risk study.  No change from prior study of 2015.  ECHO: 09/21/2017 - Left ventricle: The cavity size was normal. Wall thickness was   normal. Systolic function was normal. The estimated ejection   fraction was in the range of 55% to 60%. Wall motion was normal;   there were no regional wall motion abnormalities. Doppler  parameters are consistent with abnormal left ventricular   relaxation (grade 1 diastolic dysfunction). The E/e&' ratio is   between 8-15, suggesting indeterminate LV fililng pressure. - Aortic valve: Trileaflet. Sclerosis without stenosis. There was   trivial regurgitation. - Mitral valve: Mildly thickened leaflets . There was trivial   regurgitation. - Left atrium: Moderately dilated. - Right atrium: The atrium was mildly dilated. - Inferior vena cava: The vessel was normal in size. The   respirophasic diameter changes were in the normal range (= 50%),   consistent with normal central venous pressure. Impressions: - Compared to a prior study in 2015, there have been few changes.   The left atrium is now moderately dilated.   Recent Labs: 09/21/2017: BUN 12; Creatinine, Ser 0.63; Hemoglobin 11.2; Magnesium 2.2; Platelets 256; Potassium 3.3; Sodium 142    Lipid Panel No results found for: CHOL, TRIG, HDL, CHOLHDL, VLDL, LDLCALC, LDLDIRECT   Wt Readings from Last 3 Encounters:  10/07/17 197 lb (89.4 kg)  09/30/17 194 lb (88 kg)  09/21/17 194 lb (88 kg)     Other studies Reviewed: Additional studies/ records that were reviewed today include: office notes, hospital records and testing.  ASSESSMENT AND PLAN:  1.  PAF: [name] states that she has palpitations regularly, but they do not last long.  She denies  presyncope or syncope.  She feels her heart rate speed up when she exerts herself at times, which is associated with hypertension.  However, she does not know exactly how fast her heart is going and is unsure if her heart rate is just increasing in response to exertion or if she is having atrial fib.    She is encouraged to take her blood pressure when she feels bad and notice if her heart rate is greater than 120 or is irregular.  Daughter also mentions that when she feels her heart rate goes up, she becomes anxious.  She is to continue the diltiazem at the current dose.  She is asymptomatic with a heart rate in the 50s.  2.  Hypertension: Her blood pressures under good control at baseline.  She is encouraged to track episodes when they occur and she may end up needing to be on a as needed medication such as hydralazine.   3.  Hypokalemia: We will check her BMET again today.  I explained that I did not want to stop her diuretic because it is a good blood pressure medication, but it is also probably responsible for the low potassium.  4.  Chest pain: She is not having any exertional symptoms.  I advised that her stress test was fine, EF normal by echo, no further cardiac workup indicated.   Current medicines are reviewed at length with the patient today.  The patient has concerns regarding medicines.  The following changes have been made:  no change  Labs/ tests ordered today include:   Orders Placed This Encounter  Procedures  . Basic metabolic panel  . EKG 12-Lead     Disposition:   FU with Dr. Swaziland  Signed, Theodore Demark, PA-C  10/07/2017 2:16 PM    [facility] Phone: [phone]; Fax: [phone]  This note was written with the assistance of speech recognition software. Please excuse any transcriptional errors.

## 2017-10-07 NOTE — Patient Instructions (Signed)
Medication Instructions: Your physician recommends that you continue on your current medications as directed. Please refer to the Current Medication list given to you today.  If you need a refill on your cardiac medications before your next appointment, please call your pharmacy.   Labwork: Your physician recommends that you have the following lab today: BMET   Follow-Up: Your physician wants you to follow-up in: October with Dr. SwazilandJordan and with Theodore Demarkhonda Barrett, PA as needed. You will receive a reminder letter in the mail two months in advance. If you don't receive a letter, please call our office at [phone] to schedule this follow-up appointment.   Special Instructions:  * Start walking 5 minutes daily. Work up to 30 minutes daily as tolerated. * Please stay on a heart healthy diet.   Thank you for choosing [facility]!!

## 2017-10-08 LAB — BASIC METABOLIC PANEL
BUN/Creatinine Ratio: 18 (ref 12–28)
BUN: 13 mg/dL (ref 8–27)
CALCIUM: 9.4 mg/dL (ref 8.7–10.3)
CHLORIDE: 104 mmol/L (ref 96–106)
CO2: 23 mmol/L (ref 20–29)
Creatinine, Ser: 0.71 mg/dL (ref 0.57–1.00)
GFR calc Af Amer: 94 mL/min/{1.73_m2} (ref >59)
GFR calc non Af Amer: 81 mL/min/{1.73_m2} (ref >59)
Glucose: 101 mg/dL — ABNORMAL HIGH (ref 65–99)
Potassium: 4.4 mmol/L (ref 3.5–5.2)
Sodium: 144 mmol/L (ref 134–144)

## 2017-10-12 ENCOUNTER — Telehealth: Payer: Self-pay | Admitting: *Deleted

## 2017-10-12 DIAGNOSIS — I10 Essential (primary) hypertension: Secondary | ICD-10-CM

## 2017-10-12 MED ORDER — POTASSIUM CHLORIDE CRYS ER 20 MEQ PO TBCR: 20.0000 meq | EXTENDED_RELEASE_TABLET | ORAL | 0 refills | Status: DC

## 2017-10-12 NOTE — Telephone Encounter (Signed)
-----   Message from Darrol Jumphonda G Barrett, PA-C sent at 10/10/2017  4:39 PM EST ----- Please let her know her K+ is improved. Can take the Kdur every other day. Needs a BMET in a month or so, can get that with us or thru PCP.  Thanks

## 2017-10-12 NOTE — Telephone Encounter (Signed)
Patient verbalized her understanding and lab orders have been placed.

## 2018-02-14 ENCOUNTER — Ambulatory Visit: Payer: Medicare Other | Admitting: Podiatry

## 2018-02-14 ENCOUNTER — Encounter: Payer: Self-pay | Admitting: Podiatry

## 2018-02-14 DIAGNOSIS — R600 Localized edema: Secondary | ICD-10-CM | POA: Diagnosis not present

## 2018-02-14 DIAGNOSIS — M79672 Pain in left foot: Secondary | ICD-10-CM

## 2018-02-14 DIAGNOSIS — S92355A Nondisplaced fracture of fifth metatarsal bone, left foot, initial encounter for closed fracture: Secondary | ICD-10-CM

## 2018-02-14 DIAGNOSIS — R269 Unspecified abnormalities of gait and mobility: Secondary | ICD-10-CM

## 2018-02-14 NOTE — Progress Notes (Signed)
Subjective:  Patient ID: Vicki Morris, female    DOB: 06/17/1936,  MRN: [id number]  Chief Complaint  Patient presents with  . Foot Injury    L foot injury (fell and twisted) x Friday; 8-10/ 10 sharp pain Tx: tylenol Pt. stated," I went to [facility] and they took XR and dx me w/ 5th met. fx."    82 y.o. female presents with the above complaint. States she fell out her door and twisted her foot. Went to [facility] and XR were taken. She was told she had a fx and was given a surgical shoe. Has been taking tylenol for pain. Pain worst at night.  Past Medical History:  Diagnosis Date  . Allergic rhinitis, cause unspecified   . Chronic anticoagulation   . GERD (gastroesophageal reflux disease)   . History of TIAs   . Hyperlipidemia   . Hypothyroidism   . Normal nuclear stress test March 2013   EF 64%, clinically and electrically negative for ischemia. Myoview scan with apical thinning, could not exclude small subendocardial scar. No ischemia.   . Obesity   . PAF (paroxysmal atrial fibrillation) ([place])   . Polyp of nasal cavity    Past Surgical History:  Procedure Laterality Date  . BLADDER SURGERY    . CARPAL TUNNEL RELEASE    . NASAL SINUS SURGERY    . TOTAL ABDOMINAL HYSTERECTOMY W/ BILATERAL SALPINGOOPHORECTOMY    . US ECHOCARDIOGRAPHY  01/23/2010   EF 55-60%    Current Outpatient Medications:  .  cholecalciferol (VITAMIN D) 1000 units tablet, Take 1,000 Units by mouth daily., Disp: , Rfl:  .  diltiazem (CARDIZEM CD) 240 MG 24 hr capsule, Take 240 mg by mouth at bedtime. , Disp: , Rfl:  .  diphenhydrAMINE (BENADRYL) 25 mg capsule, Take 25 mg by mouth every 6 (six) hours as needed., Disp: , Rfl:  .  LORazepam (ATIVAN) 0.5 MG tablet, Take 0.5 mg by mouth at bedtime as needed for sleep. , Disp: , Rfl:  .  olmesartan-hydrochlorothiazide (BENICAR HCT) 20-12.5 MG tablet, Take 1 tablet by mouth daily., Disp: , Rfl: 0 .  omeprazole (PRILOSEC) 20 MG capsule, Take 20 mg by mouth daily after breakfast.  , Disp: , Rfl: 0 .  potassium chloride (KLOR-CON) 20 MEQ packet, Take 20 mEq by mouth 2 (two) times daily., Disp: , Rfl:  .  rosuvastatin (CRESTOR) 10 MG tablet, Take 10 mg by mouth at bedtime. , Disp: , Rfl: 0 .  sertraline (ZOLOFT) 50 MG tablet, Take 50 mg by mouth at bedtime. , Disp: , Rfl:  .  SYNTHROID 100 MCG tablet, Take 100 mcg by mouth daily before breakfast. , Disp: , Rfl: 0 .  warfarin (COUMADIN) 2 MG tablet, Take 1-2 mg by mouth daily. Take 1 mg MWF, 2 mg on all other days, Disp: , Rfl:   Allergies  Allergen Reactions  . Other Anaphylaxis    Red meat, Pork, Beef, Lamb  . Sulfonamide Derivatives   . Clarithromycin Nausea And Vomiting  . Nabumetone Rash   Review of Systems: Negative except as noted in the HPI. Denies N/V/F/Ch. Objective:  There were no vitals filed for this visit. General AA&O x3. Normal mood and affect.  Vascular Dorsalis pedis and posterior tibial pulses  present 2+ bilaterally  Capillary refill normal to all digits. Pedal hair growth normal.  Neurologic Epicritic sensation grossly present.  Dermatologic No open lesions. Interspaces clear of maceration. Nails well groomed and normal in appearance.  Orthopedic: MMT 5/5  in dorsiflexion, plantarflexion, inversion, and eversion. POP L 5th metatarsal base. No pain along the peroneals.   Assessment & Plan:  Patient was evaluated and treated and all questions answered.  L 5th Met Base Fx -XR from [facility] reviewed. Nicole Kindred II extra-articular 5th metatarsal base fx.  -Would benefit from trial of non-operative management. -Unna boot applied to LLE -CAM boot to LLE  F/u in 6 weeks for new XR.  Return in about 6 weeks (around 03/28/2018).

## 2018-03-08 ENCOUNTER — Ambulatory Visit: Payer: Medicare Other | Admitting: Podiatry

## 2018-03-28 ENCOUNTER — Ambulatory Visit: Payer: Medicare Other | Admitting: Podiatry

## 2018-03-28 ENCOUNTER — Ambulatory Visit (INDEPENDENT_AMBULATORY_CARE_PROVIDER_SITE_OTHER): Payer: Medicare Other

## 2018-03-28 DIAGNOSIS — R600 Localized edema: Secondary | ICD-10-CM | POA: Diagnosis not present

## 2018-03-28 DIAGNOSIS — S92355D Nondisplaced fracture of fifth metatarsal bone, left foot, subsequent encounter for fracture with routine healing: Secondary | ICD-10-CM | POA: Diagnosis not present

## 2018-03-28 NOTE — Progress Notes (Signed)
Subjective:  Patient ID: Vicki Morris, female    DOB: [birth year],  MRN: [id number]  No chief complaint on file.   82 y.o. female presents with the above complaint.  States the left foot is doing good other than a burning sensation that she gets  throughout the day.  Has tried Tylenol and using her boot.  Past Medical History:  Diagnosis Date  . Allergic rhinitis, cause unspecified   . Chronic anticoagulation   . GERD (gastroesophageal reflux disease)   . History of TIAs   . Hyperlipidemia   . Hypothyroidism   . Normal nuclear stress test March 2013   EF 64%, clinically and electrically negative for ischemia. Myoview scan with apical thinning, could not exclude small subendocardial scar. No ischemia.   . Obesity   . PAF (paroxysmal atrial fibrillation) ([place])   . Polyp of nasal cavity    Past Surgical History:  Procedure Laterality Date  . BLADDER SURGERY    . CARPAL TUNNEL RELEASE    . NASAL SINUS SURGERY    . TOTAL ABDOMINAL HYSTERECTOMY W/ BILATERAL SALPINGOOPHORECTOMY    . US ECHOCARDIOGRAPHY  01/23/2010   EF 55-60%    Current Outpatient Medications:  .  cholecalciferol (VITAMIN D) 1000 units tablet, Take 1,000 Units by mouth daily., Disp: , Rfl:  .  diltiazem (CARDIZEM CD) 240 MG 24 hr capsule, Take 240 mg by mouth at bedtime. , Disp: , Rfl:  .  diphenhydrAMINE (BENADRYL) 25 mg capsule, Take 25 mg by mouth every 6 (six) hours as needed., Disp: , Rfl:  .  LORazepam (ATIVAN) 0.5 MG tablet, Take 0.5 mg by mouth at bedtime as needed for sleep. , Disp: , Rfl:  .  olmesartan-hydrochlorothiazide (BENICAR HCT) 20-12.5 MG tablet, Take 1 tablet by mouth daily., Disp: , Rfl: 0 .  omeprazole (PRILOSEC) 20 MG capsule, Take 20 mg by mouth daily after breakfast. , Disp: , Rfl: 0 .  potassium chloride (KLOR-CON) 20 MEQ packet, Take 20 mEq by mouth 2 (two) times daily., Disp: , Rfl:  .  rosuvastatin (CRESTOR) 10 MG tablet, Take 10 mg by mouth at bedtime. , Disp: , Rfl: 0 .  sertraline  (ZOLOFT) 50 MG tablet, Take 50 mg by mouth at bedtime. , Disp: , Rfl:  .  SYNTHROID 100 MCG tablet, Take 100 mcg by mouth daily before breakfast. , Disp: , Rfl: 0 .  warfarin (COUMADIN) 2 MG tablet, Take 1-2 mg by mouth daily. Take 1 mg MWF, 2 mg on all other days, Disp: , Rfl:   Allergies  Allergen Reactions  . Other Anaphylaxis    Red meat, Pork, Beef, Lamb  . Sulfonamide Derivatives   . Clarithromycin Nausea And Vomiting  . Nabumetone Rash   Review of Systems: Negative except as noted in the HPI. Denies N/V/F/Ch. Objective:  There were no vitals filed for this visit. General AA&O x3. Normal mood and affect.  Vascular Dorsalis pedis and posterior tibial pulses  present 2+ bilaterally  Capillary refill normal to all digits. Pedal hair growth normal.  Neurologic Epicritic sensation grossly present.  Dermatologic No open lesions. Interspaces clear of maceration. Nails well groomed and normal in appearance.  Orthopedic: MMT 5/5 in dorsiflexion, plantarflexion, inversion, and eversion. POP L 5th metatarsal base. No pain along the peroneals.   Assessment & Plan:  Patient was evaluated and treated and all questions answered.  L 5th Met Base Fx -XR taken reviewed interval bridging but not completely consolidated. -Continue cam boot x2 weeks at  that point transition to normal shoe gear should the pain worsen return for prompt appointment. Will up in 6 weeks for recheck.  Advised to cancel should pain resolve  No follow-ups on file.

## 2018-05-09 ENCOUNTER — Ambulatory Visit: Payer: Medicare Other | Admitting: Podiatry

## 2018-05-09 ENCOUNTER — Ambulatory Visit (INDEPENDENT_AMBULATORY_CARE_PROVIDER_SITE_OTHER): Payer: Medicare Other

## 2018-05-09 DIAGNOSIS — S92355D Nondisplaced fracture of fifth metatarsal bone, left foot, subsequent encounter for fracture with routine healing: Secondary | ICD-10-CM

## 2018-05-09 NOTE — Progress Notes (Signed)
  Subjective:  Patient ID: Vicki Morris, female    DOB: [birth year],  MRN: [id number]  No chief complaint on file.  82 y.o. female presents for follow-up left foot fracture.  Doing well.  Denies pain. Ambulating in normal shoe gear without issue.  Past Medical History:  Diagnosis Date  . Allergic rhinitis, cause unspecified   . Chronic anticoagulation   . GERD (gastroesophageal reflux disease)   . History of TIAs   . Hyperlipidemia   . Hypothyroidism   . Normal nuclear stress test March 2013   EF 64%, clinically and electrically negative for ischemia. Myoview scan with apical thinning, could not exclude small subendocardial scar. No ischemia.   . Obesity   . PAF (paroxysmal atrial fibrillation) ([place])   . Polyp of nasal cavity    Past Surgical History:  Procedure Laterality Date  . BLADDER SURGERY    . CARPAL TUNNEL RELEASE    . NASAL SINUS SURGERY    . TOTAL ABDOMINAL HYSTERECTOMY W/ BILATERAL SALPINGOOPHORECTOMY    . US ECHOCARDIOGRAPHY  01/23/2010   EF 55-60%    Current Outpatient Medications:  .  cholecalciferol (VITAMIN D) 1000 units tablet, Take 1,000 Units by mouth daily., Disp: , Rfl:  .  diltiazem (CARDIZEM CD) 240 MG 24 hr capsule, Take 240 mg by mouth at bedtime. , Disp: , Rfl:  .  diphenhydrAMINE (BENADRYL) 25 mg capsule, Take 25 mg by mouth every 6 (six) hours as needed., Disp: , Rfl:  .  LORazepam (ATIVAN) 0.5 MG tablet, Take 0.5 mg by mouth at bedtime as needed for sleep. , Disp: , Rfl:  .  olmesartan-hydrochlorothiazide (BENICAR HCT) 20-12.5 MG tablet, Take 1 tablet by mouth daily., Disp: , Rfl: 0 .  omeprazole (PRILOSEC) 20 MG capsule, Take 20 mg by mouth daily after breakfast. , Disp: , Rfl: 0 .  potassium chloride (KLOR-CON) 20 MEQ packet, Take 20 mEq by mouth 2 (two) times daily., Disp: , Rfl:  .  rosuvastatin (CRESTOR) 10 MG tablet, Take 10 mg by mouth at bedtime. , Disp: , Rfl: 0 .  sertraline (ZOLOFT) 50 MG tablet, Take 50 mg by mouth at bedtime. , Disp:  , Rfl:  .  SYNTHROID 100 MCG tablet, Take 100 mcg by mouth daily before breakfast. , Disp: , Rfl: 0 .  warfarin (COUMADIN) 2 MG tablet, Take 1-2 mg by mouth daily. Take 1 mg MWF, 2 mg on all other days, Disp: , Rfl:   Allergies  Allergen Reactions  . Other Anaphylaxis    Red meat, Pork, Beef, Lamb  . Sulfonamide Derivatives   . Clarithromycin Nausea And Vomiting  . Nabumetone Rash   Review of Systems: Negative except as noted in the HPI. Denies N/V/F/Ch. Objective:  There were no vitals filed for this visit. General AA&O x3. Normal mood and affect.  Vascular Dorsalis pedis and posterior tibial pulses  present 2+ bilaterally  Capillary refill normal to all digits. Pedal hair growth normal.  Neurologic Epicritic sensation grossly present.  Dermatologic No open lesions. Interspaces clear of maceration. Nails well groomed and normal in appearance.  Orthopedic: MMT 5/5 in dorsiflexion, plantarflexion, inversion, and eversion. No pain to palpation L 5th metatarsal base. No pain along the peroneals.   Assessment & Plan:  Patient was evaluated and treated and all questions answered.  L 5th Met Base Fx -XR taken reviewed.  -Fracture bridged. -Advised to follow-up should further issues persist  Return if symptoms worsen or fail to improve.

## 2018-08-16 ENCOUNTER — Telehealth: Payer: Self-pay | Admitting: *Deleted

## 2018-08-16 NOTE — Telephone Encounter (Signed)
Pt takes warfarin for afib with CHADS2VASc score of 7 (age x2, sex, HTN, CAD, TIA). She will require bridging with Lovenox therapy in order to hold her warfarin for 5 days prior to procedure. Please forward to PCP who manages warfarin so that they can coordinate Lovenox bridge.

## 2018-08-16 NOTE — Telephone Encounter (Signed)
Pt has been scheduled to see Azalee CourseHao Meng, PA-C, 08/22/18 @ 11:00 with understanding to arrive 15 mins prior for registration.

## 2018-08-16 NOTE — Telephone Encounter (Signed)
   Primary Cardiologist:Peter SwazilandJordan, MD  Chart reviewed as part of pre-operative protocol coverage. Because of Vicki Morris's past medical history and time since last visit, he/she will require a follow-up visit in order to better assess preoperative cardiovascular risk.  Pre-op covering staff: - Please schedule appointment and call patient to inform them. - Please contact requesting surgeon's office via preferred method (i.e, phone, fax) to inform them of need for appointment prior to surgery.  Coumadin managed by PCP. Will send pharmacy to review.   PottsgroveBhavinkumar Allessandra Bernardi, [place]  08/16/2018, 3:53 PM

## 2018-08-16 NOTE — Telephone Encounter (Signed)
   [facility] Pre-operative Risk Assessment    Request for surgical clearance:  1. What type of surgery is being performed? LEFT KNEE ARTHROSCOPY w/SUB-CHONDROPLASTY of MEDIAL TIBIAL PLATEAU STRESS FRACTURE   2. When is this surgery scheduled? TBD   3. Are there any medications that need to be held prior to surgery and how long? PT IS ON COUMADIN MANAGED BY DR. PHARR   4. Practice name and name of physician performing surgery? [facility]; DR. Jenny Reichmann GRAVES    5. What is your office phone and fax number? PH# [phone]; FAX# [phone]   6. Anesthesia type (None, local, MAC, general) ? GENERAL   Julaine Hua 08/16/2018, 11:39 AM  _________________________________________________________________   (provider comments below)

## 2018-08-22 ENCOUNTER — Other Ambulatory Visit: Payer: Self-pay | Admitting: Orthopedic Surgery

## 2018-08-22 ENCOUNTER — Ambulatory Visit: Payer: Medicare Other | Admitting: Physician Assistant

## 2018-08-22 ENCOUNTER — Encounter: Payer: Self-pay | Admitting: Physician Assistant

## 2018-08-22 VITALS — BP 136/72 | HR 79 | Ht 64.0 in | Wt 199.0 lb

## 2018-08-22 DIAGNOSIS — I10 Essential (primary) hypertension: Secondary | ICD-10-CM | POA: Diagnosis not present

## 2018-08-22 DIAGNOSIS — Z01810 Encounter for preprocedural cardiovascular examination: Secondary | ICD-10-CM

## 2018-08-22 DIAGNOSIS — I480 Paroxysmal atrial fibrillation: Secondary | ICD-10-CM

## 2018-08-22 DIAGNOSIS — E785 Hyperlipidemia, unspecified: Secondary | ICD-10-CM

## 2018-08-22 DIAGNOSIS — E039 Hypothyroidism, unspecified: Secondary | ICD-10-CM

## 2018-08-22 NOTE — Telephone Encounter (Signed)
I have seen the patient today in clinic and cleared her for surgery. See letters section under Epic system for clearance letter. She was also given a physical copy of clearance letter as well. She plans to see the coumadin clinic at her PCP's office for lovenox bridge.   Ramond DialSigned, Kenden Brandt PA Pager: [phone]

## 2018-08-22 NOTE — Progress Notes (Signed)
Cardiology Office Note    Date:  08/24/2018   ID:  Vicki Morris, DOB 06/30/1936, MRN [id number]  PCP:  Merri BrunettePharr, Walter, MD  Cardiologist: Dr. SwazilandJordan  Chief Complaint  Patient presents with  . Follow-up    seen for Dr. SwazilandJordan     History of Present Illness:  Vicki Morris is a 82 y.o. female with PMH of HTN, HLD, hypothyroidism, PAF on chronic Coumadin therapy, GERD and a history of TIA.  Myoview and echo performed in June 2015 was unremarkable.  Patient was last seen by Dr. SwazilandJordan in October 2018.  She was seen in the emergency room in December 2018 for chest pain.  She was admitted briefly.  Serial troponin was negative.  Potassium on arrival was low, however this was repleted.  Echocardiogram obtained on 09/21/2017 showed EF 55 to 60%, grade 1 DD, trivial mitral regurgitation, trivial aortic regurgitation, moderately dilated left atrium.  She was seen in the hospital by cardiology service, chest pain was felt to be very atypical.  Outpatient stress test was recommended.  She had Myoview performed on 09/30/2017 which showed EF 51%, small defect of mild severity in the basal inferolateral location which is partially reversible in the likely related to variation in the diaphragmatic attenuation artifact.  There was small defect of moderate severity in the apex location which is nonreversible consistent with attenuation artifact as well, no obvious ischemia was noted.  Overall this is considered a low risk study.  Patient was last seen by Theodore Demarkhonda Barrett, PA-C on 10/07/2017.  Patient presents today for preoperative clearance.  She has upcoming left knee arthroscopy was sub-chondroplasty of medial tibial plateau by Dr. Jodi GeraldsJohn Graves of [facility].  From cardiology perspective, patient has been doing well.  Her functional ability is limited.  However she denies any obvious chest pain or worsening shortness of breath.  She does not have any history of coronary artery  disease.  EKG today is unchanged compared to the previous EKG.  She is cleared to proceed with the intended surgery.  I also briefly reviewed her EKG and the plan with her primary cardiologist Dr. SwazilandJordan as well.   Past Medical History:  Diagnosis Date  . Allergic rhinitis, cause unspecified   . Chronic anticoagulation   . GERD (gastroesophageal reflux disease)   . History of TIAs   . Hyperlipidemia   . Hypothyroidism   . Normal nuclear stress test March 2013   EF 64%, clinically and electrically negative for ischemia. Myoview scan with apical thinning, could not exclude small subendocardial scar. No ischemia.   . Obesity   . PAF (paroxysmal atrial fibrillation) ([facility])   . Polyp of nasal cavity     Past Surgical History:  Procedure Laterality Date  . BLADDER SURGERY    . CARPAL TUNNEL RELEASE    . NASAL SINUS SURGERY    . TOTAL ABDOMINAL HYSTERECTOMY W/ BILATERAL SALPINGOOPHORECTOMY    . US ECHOCARDIOGRAPHY  01/23/2010   EF 55-60%    Current Medications: Outpatient Medications Prior to Visit  Medication Sig Dispense Refill  . cholecalciferol (VITAMIN D) 1000 units tablet Take 1,000 Units by mouth daily.    Marland Kitchen. diltiazem (CARDIZEM CD) 240 MG 24 hr capsule Take 240 mg by mouth at bedtime.     . diphenhydrAMINE (BENADRYL) 25 mg capsule Take 25 mg by mouth every 6 (six) hours as needed.    Marland Kitchen. LORazepam (ATIVAN) 0.5 MG tablet Take 0.5 mg by mouth at  bedtime as needed for sleep.     Marland Kitchen olmesartan-hydrochlorothiazide (BENICAR HCT) 20-12.5 MG tablet Take 1 tablet by mouth daily.  0  . omeprazole (PRILOSEC) 20 MG capsule Take 20 mg by mouth daily after breakfast.   0  . potassium chloride (KLOR-CON) 20 MEQ packet Take 20 mEq by mouth every other day.     . rosuvastatin (CRESTOR) 10 MG tablet Take 10 mg by mouth at bedtime.   0  . sertraline (ZOLOFT) 50 MG tablet Take 50 mg by mouth at bedtime.     Marland Kitchen SYNTHROID 100 MCG tablet Take 100 mcg by mouth daily before breakfast.   0  . trimethoprim  (TRIMPEX) 100 MG tablet TK 1 T PO D  0  . warfarin (COUMADIN) 2 MG tablet Take 1-2 mg by mouth daily. Take 1 mg MWF, 2 mg on all other days     No facility-administered medications prior to visit.      Allergies:   Other; Sulfonamide derivatives; Clarithromycin; and Nabumetone   Social History   Socioeconomic History  . Marital status: Married    Spouse name: Not on file  . Number of children: Not on file  . Years of education: Not on file  . Highest education level: Not on file  Occupational History  . Occupation: Retired  Engineer, production  . Financial resource strain: Not on file  . Food insecurity:    Worry: Not on file    Inability: Not on file  . Transportation needs:    Medical: Not on file    Non-medical: Not on file  Tobacco Use  . Smoking status: Never Smoker  . Smokeless tobacco: Never Used  Substance and Sexual Activity  . Alcohol use: No  . Drug use: No  . Sexual activity: Never  Lifestyle  . Physical activity:    Days per week: Not on file    Minutes per session: Not on file  . Stress: Not on file  Relationships  . Social connections:    Talks on phone: Not on file    Gets together: Not on file    Attends religious service: Not on file    Active member of club or organization: Not on file    Attends meetings of clubs or organizations: Not on file    Relationship status: Not on file  Other Topics Concern  . Not on file  Social History Narrative  . Not on file     Family History:  The patient's family history includes Breast cancer in her paternal aunt; Heart attack in her father; Heart disease in her father and mother.   ROS:   Please see the history of present illness.    ROS All other systems reviewed and are negative.   PHYSICAL EXAM:   VS:  BP 136/72   Pulse 79   Ht 5\' 4"  (1.626 m)   Wt 199 lb (90.3 kg)   SpO2 98%   BMI 34.16 kg/m    GEN: Well nourished, well developed, in no acute distress  HEENT: normal  Neck: no JVD, carotid bruits,  or masses Cardiac: RRR; no murmurs, rubs, or gallops,no edema  Respiratory:  clear to auscultation bilaterally, normal work of breathing GI: soft, nontender, nondistended, + BS MS: no deformity or atrophy  Skin: warm and dry, no rash Neuro:  Alert and Oriented x 3, Strength and sensation are intact Psych: euthymic mood, full affect  Wt Readings from Last 3 Encounters:  08/22/18 199 lb (90.3  kg)  10/07/17 197 lb (89.4 kg)  09/30/17 194 lb (88 kg)      Studies/Labs Reviewed:   EKG:  EKG is ordered today.  The ekg ordered today demonstrates sinus bradycardia, no significant ST-T wave changes  Recent Labs: 09/21/2017: Hemoglobin 11.2; Magnesium 2.2; Platelets 256 10/07/2017: BUN 13; Creatinine, Ser 0.71; Potassium 4.4; Sodium 144   Lipid Panel No results found for: CHOL, TRIG, HDL, CHOLHDL, VLDL, LDLCALC, LDLDIRECT  Additional studies/ records that were reviewed today include:   Echo 09/21/2017 LV EF: 55% -   60%  ------------------------------------------------------------------- Indications:      Chest pain 786.51.  ------------------------------------------------------------------- History:   PMH:  GERD. Chest pain &quot;burning&quot; radiating to throat. Risk factors:  Hypertension.  ------------------------------------------------------------------- Study Conclusions  - Left ventricle: The cavity size was normal. Wall thickness was   normal. Systolic function was normal. The estimated ejection   fraction was in the range of 55% to 60%. Wall motion was normal;   there were no regional wall motion abnormalities. Doppler   parameters are consistent with abnormal left ventricular   relaxation (grade 1 diastolic dysfunction). The E/e&' ratio is   between 8-15, suggesting indeterminate LV fililng pressure. - Aortic valve: Trileaflet. Sclerosis without stenosis. There was   trivial regurgitation. - Mitral valve: Mildly thickened leaflets . There was trivial    regurgitation. - Left atrium: Moderately dilated. - Right atrium: The atrium was mildly dilated. - Inferior vena cava: The vessel was normal in size. The   respirophasic diameter changes were in the normal range (= 50%),   consistent with normal central venous pressure.  Impressions:  - Compared to a prior study in 2015, there have been few changes.   The left atrium is now moderately dilated.    Myoview 09/30/2017 Study Highlights    The left ventricular ejection fraction is mildly decreased (45-54%).  Nuclear stress EF: 51%.  There was no ST segment deviation noted during stress.  There is a small defect of mild severity present in the basal inferolateral location. The defect is partially reversible and likely related to variations in diaphragmatic attenuation artifact. There is a small defect of moderate severity present in the apex location. The defect is non-reversible and in setting of normal LVF is consistent with attenuation artifact. No ischemia noted.  This is a low risk study.  No change from prior study of 2015.       ASSESSMENT:    1. Preop cardiovascular exam   2. Paroxysmal atrial fibrillation (HCC)   3. Essential hypertension   4. Hyperlipidemia, unspecified hyperlipidemia type   5. Hypothyroidism, unspecified type      PLAN:  In order of problems listed above:  1. Preoperative clearance: Patient denies any recent chest pain or shortness of breath.  Her functional ability is limited by knee pain.  However she had a fairly recent Myoview in December 2018 that was low risk.  Patient is cleared to proceed with surgery without any further work-up.  I have reviewed her EKG with Dr. Swaziland, her EKG is unchanged.  2. Paroxysmal atrial fibrillation: Coumadin managed by her PCPs office which well managed her Coumadin during perioperative period.  Continue on diltiazem.  3. Hypertension: Blood pressure well controlled on current  medication  4. Hyperlipidemia: On Crestor 10 mg daily  5. Hypothyroidism: On Synthroid.  Managed by primary care provider.    Medication Adjustments/Labs and Tests Ordered: Current medicines are reviewed at length with the patient today.  Concerns regarding medicines  are outlined above.  Medication changes, Labs and Tests ordered today are listed in the Patient Instructions below. Patient Instructions  Medication Instructions:  Your physician recommends that you continue on your current medications as directed. Please refer to the Current Medication list given to you today. If you need a refill on your cardiac medications before your next appointment, please call your pharmacy.   Lab work: None  If you have labs (blood work) drawn today and your tests are completely normal, you will receive your results only by: Marland Kitchen MyChart Message (if you have MyChart) OR . A paper copy in the mail If you have any lab test that is abnormal or we need to change your treatment, we will call you to review the results.  Testing/Procedures: None   Follow-Up: At [facility], you and your health needs are our priority.  As part of our continuing mission to provide you with exceptional heart care, we have created designated Provider Care Teams.  These Care Teams include your primary Cardiologist (physician) and Advanced Practice Providers (APPs -  Physician Assistants and Nurse Practitioners) who all work together to provide you with the care you need, when you need it. You will need a follow up appointment in 6-9 months.  Please call our office 2 months in advance to schedule this appointment.  You may see Peter Swaziland, MD or one of the following Advanced Practice Providers on your designated Care Team: [place], [place] . Micah Flesher, PA-C  Any Other Special Instructions Will Be Listed Below (If Applicable).      Ramond Dial, [place]  08/24/2018 9:04 AM    [facility] [address] [place], [place], [place]  [address] Phone: [phone]; Fax: [phone]

## 2018-08-22 NOTE — Patient Instructions (Signed)
Medication Instructions:  Your physician recommends that you continue on your current medications as directed. Please refer to the Current Medication list given to you today. If you need a refill on your cardiac medications before your next appointment, please call your pharmacy.   Lab work: None  If you have labs (blood work) drawn today and your tests are completely normal, you will receive your results only by: Marland Kitchen. MyChart Message (if you have MyChart) OR . A paper copy in the mail If you have any lab test that is abnormal or we need to change your treatment, we will call you to review the results.  Testing/Procedures: None   Follow-Up: At [facility], you and your health needs are our priority.  As part of our continuing mission to provide you with exceptional heart care, we have created designated Provider Care Teams.  These Care Teams include your primary Cardiologist (physician) and Advanced Practice Providers (APPs -  Physician Assistants and Nurse Practitioners) who all work together to provide you with the care you need, when you need it. You will need a follow up appointment in 6-9 months.  Please call our office 2 months in advance to schedule this appointment.  You may see Peter SwazilandJordan, MD or one of the following Advanced Practice Providers on your designated Care Team: [place]nee[place]ao Meng, [place]-C . Micah FlesherAngela Duke, PA-C  Any Other Special Instructions Will Be Listed Below (If Applicable).

## 2018-08-24 ENCOUNTER — Encounter: Payer: Self-pay | Admitting: Physician Assistant

## 2018-09-08 NOTE — Patient Instructions (Signed)
Vicki Morris  09/08/2018   Your procedure is scheduled on: 09/14/2018   Report to [facility] Main  Entrance  Report to admitting at   0630 AM    Call this number if you have problems the morning of surgery [phone]   Remember: Do not eat food or drink liquids :After Midnight. BRUSH YOUR TEETH MORNING OF SURGERY AND RINSE YOUR MOUTH OUT, NO CHEWING GUM CANDY OR MINTS.     Take these medicines the morning of surgery with A SIP OF WATER: Cardizem, Prilosec, synthroid                                 You may not have any metal on your body including hair pins and              piercings  Do not wear jewelry, make-up, lotions, powders or perfumes, deodorant             Do not wear nail polish.  Do not shave  48 hours prior to surgery.               Do not bring valuables to the hospital. [facility] IS NOT             RESPONSIBLE   FOR VALUABLES.  Contacts, dentures or bridgework may not be worn into surgery.      Patients discharged the day of surgery will not be allowed to drive home.  Name and phone number of your driver:                Please read over the following fact sheets you were given: _____________________________________________________________________             [facility] - Preparing for Surgery Before surgery, you can play an important role.  Because skin is not sterile, your skin needs to be as free of germs as possible.  You can reduce the number of germs on your skin by washing with CHG (chlorahexidine gluconate) soap before surgery.  CHG is an antiseptic cleaner which kills germs and bonds with the skin to continue killing germs even after washing. Please DO NOT use if you have an allergy to CHG or antibacterial soaps.  If your skin becomes reddened/irritated stop using the CHG and inform your nurse when you arrive at Short Stay. Do not shave (including legs and underarms) for at least 48 hours prior to the first CHG shower.  You  may shave your face/neck. Please follow these instructions carefully:  1.  Shower with CHG Soap the night before surgery and the  morning of Surgery.  2.  If you choose to wash your hair, wash your hair first as usual with your  normal  shampoo.  3.  After you shampoo, rinse your hair and body thoroughly to remove the  shampoo.                           4.  Use CHG as you would any other liquid soap.  You can apply chg directly  to the skin and wash                       Gently with a scrungie or clean washcloth.  5.  Apply the CHG Soap  to your body ONLY FROM THE NECK DOWN.   Do not use on face/ open                           Wound or open sores. Avoid contact with eyes, ears mouth and genitals (private parts).                       Wash face,  Genitals (private parts) with your normal soap.             6.  Wash thoroughly, paying special attention to the area where your surgery  will be performed.  7.  Thoroughly rinse your body with warm water from the neck down.  8.  DO NOT shower/wash with your normal soap after using and rinsing off  the CHG Soap.                9.  Pat yourself dry with a clean towel.            10.  Wear clean pajamas.            11.  Place clean sheets on your bed the night of your first shower and do not  sleep with pets. Day of Surgery : Do not apply any lotions/deodorants the morning of surgery.  Please wear clean clothes to the hospital/surgery center.  FAILURE TO FOLLOW THESE INSTRUCTIONS MAY RESULT IN THE CANCELLATION OF YOUR SURGERY PATIENT SIGNATURE_________________________________  NURSE SIGNATURE__________________________________  ________________________________________________________________________   [name]  An incentive spirometer is a tool that can help keep your lungs clear and active. This tool measures how well you are filling your lungs with each breath. Taking long deep breaths may help reverse or decrease the chance of  developing breathing (pulmonary) problems (especially infection) following:  A long period of time when you are unable to move or be active. BEFORE THE PROCEDURE   If the spirometer includes an indicator to show your best effort, your nurse or respiratory therapist will set it to a desired goal.  If possible, sit up straight or lean slightly forward. Try not to slouch.  Hold the incentive spirometer in an upright position. INSTRUCTIONS FOR USE  1. Sit on the edge of your bed if possible, or sit up as far as you can in bed or on a chair. 2. Hold the incentive spirometer in an upright position. 3. Breathe out normally. 4. Place the mouthpiece in your mouth and seal your lips tightly around it. 5. Breathe in slowly and as deeply as possible, raising the piston or the ball toward the top of the column. 6. Hold your breath for 3-5 seconds or for as long as possible. Allow the piston or ball to fall to the bottom of the column. 7. Remove the mouthpiece from your mouth and breathe out normally. 8. Rest for a few seconds and repeat Steps 1 through 7 at least 10 times every 1-2 hours when you are awake. Take your time and take a few normal breaths between deep breaths. 9. The spirometer may include an indicator to show your best effort. Use the indicator as a goal to work toward during each repetition. 10. After each set of 10 deep breaths, practice coughing to be sure your lungs are clear. If you have an incision (the cut made at the time of surgery), support your incision when coughing by placing a pillow or rolled up towels firmly  against it. Once you are able to get out of bed, walk around indoors and cough well. You may stop using the incentive spirometer when instructed by your caregiver.  RISKS AND COMPLICATIONS  Take your time so you do not get dizzy or light-headed.  If you are in pain, you may need to take or ask for pain medication before doing incentive spirometry. It is harder to take a  deep breath if you are having pain. AFTER USE  Rest and breathe slowly and easily.  It can be helpful to keep track of a log of your progress. Your caregiver can provide you with a simple table to help with this. If you are using the spirometer at home, follow these instructions: SEEK MEDICAL CARE IF:   You are having difficultly using the spirometer.  You have trouble using the spirometer as often as instructed.  Your pain medication is not giving enough relief while using the spirometer.  You develop fever of 100.5 F (38.1 C) or higher. SEEK IMMEDIATE MEDICAL CARE IF:   You cough up bloody sputum that had not been present before.  You develop fever of 102 F (38.9 C) or greater.  You develop worsening pain at or near the incision site. MAKE SURE YOU:   Understand these instructions.  Will watch your condition.  Will get help right away if you are not doing well or get worse. Document Released: 02/01/2007 Document Revised: 12/14/2011 Document Reviewed: 04/04/2007 [facility] Patient Information 2014 [employer], [place].   ________________________________________________________________________

## 2018-09-09 ENCOUNTER — Encounter (HOSPITAL_COMMUNITY): Payer: Self-pay

## 2018-09-09 ENCOUNTER — Other Ambulatory Visit: Payer: Self-pay

## 2018-09-09 ENCOUNTER — Encounter (HOSPITAL_COMMUNITY)
Admission: RE | Admit: 2018-09-09 | Discharge: 2018-09-09 | Disposition: A | Discharge status: Home / Self Care | Payer: Medicare Other | Source: Ambulatory Visit | Attending: Orthopedic Surgery | Admitting: Orthopedic Surgery

## 2018-09-09 DIAGNOSIS — M84362A Stress fracture, left tibia, initial encounter for fracture: Secondary | ICD-10-CM | POA: Insufficient documentation

## 2018-09-09 DIAGNOSIS — Z01812 Encounter for preprocedural laboratory examination: Secondary | ICD-10-CM | POA: Diagnosis not present

## 2018-09-09 DIAGNOSIS — S83242A Other tear of medial meniscus, current injury, left knee, initial encounter: Secondary | ICD-10-CM | POA: Insufficient documentation

## 2018-09-09 HISTORY — DX: Nausea with vomiting, unspecified: R11.2

## 2018-09-09 HISTORY — DX: Peripheral vascular disease, unspecified: I73.9

## 2018-09-09 HISTORY — DX: Other specified postprocedural states: Z98.890

## 2018-09-09 HISTORY — DX: Cerebral infarction, unspecified: I63.9

## 2018-09-09 HISTORY — DX: Depression, unspecified: F32.A

## 2018-09-09 HISTORY — DX: Essential (primary) hypertension: I10

## 2018-09-09 HISTORY — DX: Urinary tract infection, site not specified: N39.0

## 2018-09-09 HISTORY — DX: Pneumonia, unspecified organism: J18.9

## 2018-09-09 HISTORY — DX: Anxiety disorder, unspecified: F41.9

## 2018-09-09 HISTORY — DX: Major depressive disorder, single episode, unspecified: F32.9

## 2018-09-09 HISTORY — DX: Unspecified osteoarthritis, unspecified site: M19.90

## 2018-09-09 HISTORY — DX: Cardiac arrhythmia, unspecified: I49.9

## 2018-09-09 LAB — BASIC METABOLIC PANEL
Anion gap: 11 (ref 5–15)
BUN: 21 mg/dL (ref 8–23)
CO2: 25 mmol/L (ref 22–32)
Calcium: 9.2 mg/dL (ref 8.9–10.3)
Chloride: 106 mmol/L (ref 98–111)
Creatinine, Ser: 0.88 mg/dL (ref 0.44–1.00)
GFR calc Af Amer: >60 mL/min (ref >60)
GFR calc non Af Amer: >60 mL/min (ref >60)
Glucose, Bld: 112 mg/dL — ABNORMAL HIGH (ref 70–99)
Potassium: 3.7 mmol/L (ref 3.5–5.1)
Sodium: 142 mmol/L (ref 135–145)

## 2018-09-09 LAB — CBC
HCT: 37.5 % (ref 36.0–46.0)
HEMOGLOBIN: 11.5 g/dL — AB (ref 12.0–15.0)
MCH: 26.3 pg (ref 26.0–34.0)
MCHC: 30.7 g/dL (ref 30.0–36.0)
MCV: 85.6 fL (ref 80.0–100.0)
Platelets: 309 10*3/uL (ref 150–400)
RBC: 4.38 MIL/uL (ref 3.87–5.11)
RDW: 14.4 % (ref 11.5–15.5)
WBC: 7.9 10*3/uL (ref 4.0–10.5)
nRBC: 0 % (ref 0.0–0.2)

## 2018-09-09 NOTE — Progress Notes (Signed)
EKG-08/23/18-epic ECHO-09/2017-epic Stress Test- 09/30/17-epic  LOV- Cardiology- 08/22/18-epic - preop clearance in note  CXR-09/21/17-epic

## 2018-09-14 ENCOUNTER — Encounter (HOSPITAL_COMMUNITY)
Admission: RE | Disposition: A | Discharge status: Home / Self Care | Payer: Self-pay | Source: Ambulatory Visit | Attending: Orthopedic Surgery

## 2018-09-14 ENCOUNTER — Ambulatory Visit (HOSPITAL_COMMUNITY): Payer: Medicare Other | Admitting: Certified Registered Nurse Anesthetist

## 2018-09-14 ENCOUNTER — Ambulatory Visit (HOSPITAL_COMMUNITY): Payer: Medicare Other

## 2018-09-14 ENCOUNTER — Encounter (HOSPITAL_COMMUNITY): Payer: Self-pay | Admitting: *Deleted

## 2018-09-14 ENCOUNTER — Ambulatory Visit (HOSPITAL_COMMUNITY)
Admission: RE | Admit: 2018-09-14 | Discharge: 2018-09-14 | Disposition: A | Discharge status: Home / Self Care | Payer: Medicare Other | Source: Ambulatory Visit | Attending: Orthopedic Surgery | Admitting: Orthopedic Surgery

## 2018-09-14 DIAGNOSIS — I480 Paroxysmal atrial fibrillation: Secondary | ICD-10-CM | POA: Diagnosis not present

## 2018-09-14 DIAGNOSIS — M2242 Chondromalacia patellae, left knee: Secondary | ICD-10-CM | POA: Insufficient documentation

## 2018-09-14 DIAGNOSIS — E785 Hyperlipidemia, unspecified: Secondary | ICD-10-CM | POA: Insufficient documentation

## 2018-09-14 DIAGNOSIS — Z882 Allergy status to sulfonamides status: Secondary | ICD-10-CM | POA: Diagnosis not present

## 2018-09-14 DIAGNOSIS — M84362A Stress fracture, left tibia, initial encounter for fracture: Secondary | ICD-10-CM | POA: Diagnosis not present

## 2018-09-14 DIAGNOSIS — M94262 Chondromalacia, left knee: Secondary | ICD-10-CM | POA: Diagnosis present

## 2018-09-14 DIAGNOSIS — S83282A Other tear of lateral meniscus, current injury, left knee, initial encounter: Secondary | ICD-10-CM | POA: Diagnosis not present

## 2018-09-14 DIAGNOSIS — Z881 Allergy status to other antibiotic agents status: Secondary | ICD-10-CM | POA: Insufficient documentation

## 2018-09-14 DIAGNOSIS — J309 Allergic rhinitis, unspecified: Secondary | ICD-10-CM | POA: Insufficient documentation

## 2018-09-14 DIAGNOSIS — F419 Anxiety disorder, unspecified: Secondary | ICD-10-CM | POA: Diagnosis not present

## 2018-09-14 DIAGNOSIS — Z79899 Other long term (current) drug therapy: Secondary | ICD-10-CM | POA: Insufficient documentation

## 2018-09-14 DIAGNOSIS — X58XXXA Exposure to other specified factors, initial encounter: Secondary | ICD-10-CM | POA: Insufficient documentation

## 2018-09-14 DIAGNOSIS — Z7901 Long term (current) use of anticoagulants: Secondary | ICD-10-CM | POA: Diagnosis not present

## 2018-09-14 DIAGNOSIS — Z8673 Personal history of transient ischemic attack (TIA), and cerebral infarction without residual deficits: Secondary | ICD-10-CM | POA: Diagnosis not present

## 2018-09-14 DIAGNOSIS — Z79890 Hormone replacement therapy (postmenopausal): Secondary | ICD-10-CM | POA: Diagnosis not present

## 2018-09-14 DIAGNOSIS — K219 Gastro-esophageal reflux disease without esophagitis: Secondary | ICD-10-CM | POA: Insufficient documentation

## 2018-09-14 DIAGNOSIS — E669 Obesity, unspecified: Secondary | ICD-10-CM | POA: Insufficient documentation

## 2018-09-14 DIAGNOSIS — Z419 Encounter for procedure for purposes other than remedying health state, unspecified: Secondary | ICD-10-CM

## 2018-09-14 DIAGNOSIS — S83242A Other tear of medial meniscus, current injury, left knee, initial encounter: Secondary | ICD-10-CM | POA: Diagnosis not present

## 2018-09-14 DIAGNOSIS — Z6833 Body mass index (BMI) 33.0-33.9, adult: Secondary | ICD-10-CM | POA: Diagnosis not present

## 2018-09-14 DIAGNOSIS — F329 Major depressive disorder, single episode, unspecified: Secondary | ICD-10-CM | POA: Insufficient documentation

## 2018-09-14 DIAGNOSIS — I739 Peripheral vascular disease, unspecified: Secondary | ICD-10-CM | POA: Insufficient documentation

## 2018-09-14 DIAGNOSIS — E039 Hypothyroidism, unspecified: Secondary | ICD-10-CM | POA: Diagnosis not present

## 2018-09-14 DIAGNOSIS — S82132A Displaced fracture of medial condyle of left tibia, initial encounter for closed fracture: Secondary | ICD-10-CM | POA: Diagnosis present

## 2018-09-14 DIAGNOSIS — I10 Essential (primary) hypertension: Secondary | ICD-10-CM | POA: Insufficient documentation

## 2018-09-14 HISTORY — PX: KNEE ARTHROSCOPY: SHX127

## 2018-09-14 LAB — PROTIME-INR
INR: 1.1
Prothrombin Time: 14.1 seconds (ref 11.4–15.2)

## 2018-09-14 SURGERY — ARTHROSCOPY, KNEE
Anesthesia: General | Site: Knee | Laterality: Left

## 2018-09-14 MED ORDER — EPINEPHRINE PF 1 MG/ML IJ SOLN
INTRAMUSCULAR | Status: DC | PRN
  Administered 2018-09-14: 1 mg

## 2018-09-14 MED ORDER — LACTATED RINGERS IR SOLN
Status: DC | PRN
  Administered 2018-09-14: 3000 mL

## 2018-09-14 MED ORDER — FENTANYL CITRATE (PF) 100 MCG/2ML IJ SOLN
INTRAMUSCULAR | Status: AC
  Filled 2018-09-14: qty 2

## 2018-09-14 MED ORDER — ONDANSETRON HCL 4 MG/2ML IJ SOLN
INTRAMUSCULAR | Status: AC
  Filled 2018-09-14: qty 2

## 2018-09-14 MED ORDER — LIDOCAINE 2% (20 MG/ML) 5 ML SYRINGE
INTRAMUSCULAR | Status: DC | PRN
  Administered 2018-09-14: 60 mg via INTRAVENOUS

## 2018-09-14 MED ORDER — EPINEPHRINE PF 1 MG/ML IJ SOLN
INTRAMUSCULAR | Status: AC
  Filled 2018-09-14: qty 1

## 2018-09-14 MED ORDER — OXYCODONE-ACETAMINOPHEN 5-325 MG PO TABS: 1.0000 | ORAL_TABLET | Freq: Four times a day (QID) | ORAL | 0 refills | Status: DC | PRN

## 2018-09-14 MED ORDER — CEFAZOLIN SODIUM-DEXTROSE 2-4 GM/100ML-% IV SOLN
INTRAVENOUS | Status: AC
  Filled 2018-09-14: qty 100

## 2018-09-14 MED ORDER — FENTANYL CITRATE (PF) 100 MCG/2ML IJ SOLN
INTRAMUSCULAR | Status: DC | PRN
  Administered 2018-09-14 (×3): 50 ug via INTRAVENOUS
  Administered 2018-09-14: 100 ug via INTRAVENOUS
  Administered 2018-09-14: 50 ug via INTRAVENOUS

## 2018-09-14 MED ORDER — EPHEDRINE SULFATE-NACL 50-0.9 MG/10ML-% IV SOSY
PREFILLED_SYRINGE | INTRAVENOUS | Status: DC | PRN
  Administered 2018-09-14: 10 mg via INTRAVENOUS

## 2018-09-14 MED ORDER — KETOROLAC TROMETHAMINE 30 MG/ML IJ SOLN: 15.0000 mg | Freq: Once | INTRAMUSCULAR | Status: DC | PRN

## 2018-09-14 MED ORDER — CHLORHEXIDINE GLUCONATE 4 % EX LIQD: 60.0000 mL | Freq: Once | CUTANEOUS | Status: DC

## 2018-09-14 MED ORDER — PROMETHAZINE HCL 25 MG/ML IJ SOLN: 6.2500 mg | INTRAMUSCULAR | Status: DC | PRN

## 2018-09-14 MED ORDER — DEXAMETHASONE SODIUM PHOSPHATE 10 MG/ML IJ SOLN
INTRAMUSCULAR | Status: DC | PRN
  Administered 2018-09-14: 10 mg via INTRAVENOUS

## 2018-09-14 MED ORDER — DEXAMETHASONE SODIUM PHOSPHATE 10 MG/ML IJ SOLN
INTRAMUSCULAR | Status: AC
  Filled 2018-09-14: qty 1

## 2018-09-14 MED ORDER — ACETAMINOPHEN 500 MG PO TABS
1000.0000 mg | ORAL_TABLET | Freq: Once | ORAL | Status: AC
  Administered 2018-09-14: 1000 mg via ORAL

## 2018-09-14 MED ORDER — CEFAZOLIN SODIUM-DEXTROSE 2-4 GM/100ML-% IV SOLN
2.0000 g | INTRAVENOUS | Status: AC
  Administered 2018-09-14: 2 g via INTRAVENOUS

## 2018-09-14 MED ORDER — PROPOFOL 10 MG/ML IV BOLUS
INTRAVENOUS | Status: AC
  Filled 2018-09-14: qty 20

## 2018-09-14 MED ORDER — BUPIVACAINE HCL (PF) 0.5 % IJ SOLN
INTRAMUSCULAR | Status: AC
  Filled 2018-09-14: qty 60

## 2018-09-14 MED ORDER — LIDOCAINE 2% (20 MG/ML) 5 ML SYRINGE
INTRAMUSCULAR | Status: AC
  Filled 2018-09-14: qty 5

## 2018-09-14 MED ORDER — FENTANYL CITRATE (PF) 100 MCG/2ML IJ SOLN
25.0000 ug | INTRAMUSCULAR | Status: DC | PRN
  Administered 2018-09-14 (×3): 50 ug via INTRAVENOUS

## 2018-09-14 MED ORDER — ONDANSETRON HCL 4 MG/2ML IJ SOLN
INTRAMUSCULAR | Status: DC | PRN
  Administered 2018-09-14: 4 mg via INTRAVENOUS

## 2018-09-14 MED ORDER — PROPOFOL 10 MG/ML IV BOLUS
INTRAVENOUS | Status: DC | PRN
  Administered 2018-09-14: 150 mg via INTRAVENOUS

## 2018-09-14 MED ORDER — ACETAMINOPHEN 500 MG PO TABS
ORAL_TABLET | ORAL | Status: AC
  Filled 2018-09-14: qty 2

## 2018-09-14 MED ORDER — BUPIVACAINE HCL (PF) 0.5 % IJ SOLN
INTRAMUSCULAR | Status: DC | PRN
  Administered 2018-09-14: 20 mL

## 2018-09-14 MED ORDER — LACTATED RINGERS IV SOLN
INTRAVENOUS | Status: DC
  Administered 2018-09-14 (×2): via INTRAVENOUS

## 2018-09-14 SURGICAL SUPPLY — 31 items
ACCMIX MIXING SYSTEM (SYSTAGENIX WOUND MANAGEMENT) ×2 IMPLANT
BANDAGE ACE 6X5 VEL STRL LF (GAUZE/BANDAGES/DRESSINGS) ×2 IMPLANT
BLADE 4.2CUDA (BLADE) ×2 IMPLANT
BLADE CUTTER GATOR 3.5 (BLADE) IMPLANT
BLADE GREAT WHITE 4.2 (BLADE) IMPLANT
BLADE SURG SZ11 CARB STEEL (BLADE) IMPLANT
BNDG GAUZE ELAST 4 BULKY (GAUZE/BANDAGES/DRESSINGS) IMPLANT
BOOTIES KNEE HIGH SLOAN (MISCELLANEOUS) ×2 IMPLANT
COVER SURGICAL LIGHT HANDLE (MISCELLANEOUS) ×2 IMPLANT
CUFF TOURN SGL QUICK 34 (TOURNIQUET CUFF) ×1
CUFF TRNQT CYL 34X4X40X1 (TOURNIQUET CUFF) ×1 IMPLANT
DRAPE SHEET LG 3/4 BI-LAMINATE (DRAPES) ×2 IMPLANT
DRSG EMULSION OIL 3X3 NADH (GAUZE/BANDAGES/DRESSINGS) ×2 IMPLANT
DRSG PAD ABDOMINAL 8X10 ST (GAUZE/BANDAGES/DRESSINGS) ×2 IMPLANT
DURAPREP 26ML APPLICATOR (WOUND CARE) ×2 IMPLANT
GAUZE SPONGE 4X4 12PLY STRL (GAUZE/BANDAGES/DRESSINGS) ×2 IMPLANT
GLOVE BIOGEL PI IND STRL 6.5 (GLOVE) ×2 IMPLANT
GLOVE BIOGEL PI INDICATOR 6.5 (GLOVE) ×2
GLOVE ECLIPSE 7.5 STRL STRAW (GLOVE) ×4 IMPLANT
GOWN STRL REUS W/ TWL XL LVL3 (GOWN DISPOSABLE) ×3 IMPLANT
GOWN STRL REUS W/TWL XL LVL3 (GOWN DISPOSABLE) ×3
KIT BASIN OR (CUSTOM PROCEDURE TRAY) IMPLANT
KNEE KIT SCP W/SIDE ACCUPORT (Joint) ×2 IMPLANT
MANIFOLD NEPTUNE II (INSTRUMENTS) ×4 IMPLANT
PACK ARTHROSCOPY WL (CUSTOM PROCEDURE TRAY) ×2 IMPLANT
PACK ICE MAXI GEL EZY WRAP (MISCELLANEOUS) IMPLANT
PADDING CAST COTTON 6X4 STRL (CAST SUPPLIES) ×2 IMPLANT
PROTECTOR NERVE ULNAR (MISCELLANEOUS) ×4 IMPLANT
SUT ETHILON 4 0 PS 2 18 (SUTURE) ×2 IMPLANT
TUBING ARTHRO INFLOW-ONLY STRL (TUBING) ×2 IMPLANT
WRAP KNEE MAXI GEL POST OP (GAUZE/BANDAGES/DRESSINGS) ×6 IMPLANT

## 2018-09-14 NOTE — H&P (Signed)
A pre op hand p   Chief Complaint: Left knee pain  HPI: Vicki Morris is a 81 y.o. female who presents for evaluation of left knee pain. It has been present for greater than 3 months and has been worsening. She has failed conservative measures. Pain is rated as severe.  Past Medical History:  Diagnosis Date  . Allergic rhinitis, cause unspecified   . Anxiety   . Arthritis   . Chronic anticoagulation   . Depression   . Dysrhythmia    afib   . GERD (gastroesophageal reflux disease)   . History of TIAs   . Hyperlipidemia   . Hypertension   . Hypothyroidism   . Normal nuclear stress test March 2013   EF 64%, clinically and electrically negative for ischemia. Myoview scan with apical thinning, could not exclude small subendocardial scar. No ischemia.   . Obesity   . PAF (paroxysmal atrial fibrillation) (HCC)   . Peripheral vascular disease (HCC)   . Pneumonia    hx of   . Polyp of nasal cavity   . PONV (postoperative nausea and vomiting)   . Stroke [facility])    mini stroke  years ago   . Urinary tract infection    hx of    Past Surgical History:  Procedure Laterality Date  . BLADDER SURGERY    . CARPAL TUNNEL RELEASE    . NASAL SINUS SURGERY    . right little toe surgery     . TOTAL ABDOMINAL HYSTERECTOMY W/ BILATERAL SALPINGOOPHORECTOMY    . US ECHOCARDIOGRAPHY  01/23/2010   EF 55-60%   Social History   Socioeconomic History  . Marital status: Widowed    Spouse name: Not on file  . Number of children: Not on file  . Years of education: Not on file  . Highest education level: Not on file  Occupational History  . Occupation: Retired  Engineer, production  . Financial resource strain: Not on file  . Food insecurity:    Worry: Not on file    Inability: Not on file  . Transportation needs:    Medical: Not on file    Non-medical: Not on file  Tobacco Use  . Smoking status: Never Smoker  . Smokeless tobacco: Never Used  Substance and Sexual Activity  . Alcohol use: No   . Drug use: No  . Sexual activity: Never  Lifestyle  . Physical activity:    Days per week: Not on file    Minutes per session: Not on file  . Stress: Not on file  Relationships  . Social connections:    Talks on phone: Not on file    Gets together: Not on file    Attends religious service: Not on file    Active member of club or organization: Not on file    Attends meetings of clubs or organizations: Not on file    Relationship status: Not on file  Other Topics Concern  . Not on file  Social History Narrative  . Not on file   Family History  Problem Relation Age of Onset  . Heart disease Mother   . Heart disease Father   . Heart attack Father   . Breast cancer Paternal Aunt    Allergies  Allergen Reactions  . Other Anaphylaxis and Other (See Comments)    Red meat, Pork, Beef, Lamb  . Sulfonamide Derivatives Other (See Comments)    Unknown  . Clarithromycin Nausea And Vomiting  . Nabumetone Rash  Prior to Admission medications   Medication Sig Start Date End Date Taking? Authorizing Provider  betamethasone valerate (VALISONE) 0.1 % cream Apply 1 application topically every other day.   Yes [provider]  diltiazem (CARDIZEM CD) 240 MG 24 hr capsule Take 240 mg by mouth at bedtime.  02/28/13  Yes [provider]  enoxaparin (LOVENOX) 150 MG/ML injection Inject 140 mg into the skin daily.  09/03/18  Yes [provider]  LORazepam (ATIVAN) 0.5 MG tablet Take 0.5 mg by mouth at bedtime as needed for sleep.    Yes [provider]  olmesartan-hydrochlorothiazide (BENICAR HCT) 20-12.5 MG tablet Take 1 tablet by mouth daily. 09/03/17  Yes [provider]  omeprazole (PRILOSEC) 20 MG capsule Take 20 mg by mouth daily after breakfast.  07/28/16  Yes [provider]  potassium chloride (KLOR-CON) 20 MEQ packet Take 20 mEq by mouth every other day.    Yes [provider]  rosuvastatin (CRESTOR) 10 MG tablet Take 10 mg  by mouth at bedtime.  09/26/16  Yes [provider]  sertraline (ZOLOFT) 50 MG tablet Take 50 mg by mouth at bedtime.    Yes [provider]  SYNTHROID 100 MCG tablet Take 100 mcg by mouth daily before breakfast.  07/12/17  Yes [provider]  trimethoprim (TRIMPEX) 100 MG tablet Take 100 mg by mouth daily.  05/18/18  Yes [provider]  warfarin (COUMADIN) 2 MG tablet Take 1-2 mg by mouth See admin instructions. Take 1 mg by mouth daily on Monday, Wednesday and Friday. Take 2 mg by mouth daily on all other days.   Yes [provider]  diphenhydrAMINE (BENADRYL) 25 mg capsule Take 25 mg by mouth daily as needed for allergies.     [provider]  EPINEPHrine (EPIPEN 2-PAK) 0.3 mg/0.3 mL IJ SOAJ injection Inject 0.3 mg into the muscle once.    [provider]     Positive ROS: None  All other systems have been reviewed and were otherwise negative with the exception of those mentioned in the HPI and as above.  Physical Exam: Vitals:   09/14/18 0706  BP: (!) 148/68  Pulse: (!) 55  Resp: 20  Temp: 97.7 F (36.5 C)  SpO2: 97%    General: Alert, no acute distress Cardiovascular: No pedal edema Respiratory: No cyanosis, no use of accessory musculature GI: No organomegaly, abdomen is soft and non-tender Skin: No lesions in the area of chief complaint Neurologic: Sensation intact distally Psychiatric: Patient is competent for consent with normal mood and affect Lymphatic: No axillary or cervical lymphadenopathy  MUSCULOSKELETAL: Left knee: Painful range of motion.  Limited range of motion.  No instability.  Tender to palpation over the medial joint line with positive McMurray.  Dramatically tender to palpation over the medial tibial plateau.  Assessment/Plan: STRESS FRACTURE MEDIAL TIBIAL PLATEAU LEFT KNEE AND A MEDIAL MENISCUS TEAR. Plan for Procedure(s): KNEE ARTHROSCOPY WITH SUBCHONDROPLASTY MEDIAL TIBIAL PLATEAU  The  risks benefits and alternatives were discussed with the patient including but not limited to the risks of nonoperative treatment, versus surgical intervention including infection, bleeding, nerve injury, malunion, nonunion, hardware prominence, hardware failure, need for hardware removal, blood clots, cardiopulmonary complications, morbidity, mortality, among others, and they were willing to proceed.  Predicted outcome is good, although there will be at least a six to nine month expected recovery.  Harvie JuniorJohn L Alonzo Loving, MD 09/14/2018 8:12 AM

## 2018-09-14 NOTE — Anesthesia Postprocedure Evaluation (Signed)
Anesthesia Post Note  Patient: [name]  Procedure(s) Performed: PARITAL MEDIAL, PARTIAL LATERAL MENISCECTOMY, PATELLA FEMORAL SUBCHONDROPLASTY, TIBIAL PLATEAU KNEE ARTHROSCOPY (Left Knee)     Patient location during evaluation: PACU Anesthesia Type: General Level of consciousness: awake and alert Pain management: pain level controlled Vital Signs Assessment: post-procedure vital signs reviewed and stable Respiratory status: spontaneous breathing, nonlabored ventilation, respiratory function stable and patient connected to nasal cannula oxygen Cardiovascular status: blood pressure returned to baseline and stable Postop Assessment: no apparent nausea or vomiting Anesthetic complications: no    Last Vitals:  Vitals:   09/14/18 1015 09/14/18 1030  BP: (!) 144/68 (!) 144/84  Pulse: (!) 58 (!) 57  Resp: 14 19  Temp:  36.7 C  SpO2: 97% (!) 89%    Last Pain:  Vitals:   09/14/18 1030  TempSrc:   PainSc: 4                  Idella Lamontagne S

## 2018-09-14 NOTE — Anesthesia Preprocedure Evaluation (Signed)
Anesthesia Evaluation  Patient identified by MRN, date of birth, ID band Patient awake    Reviewed: Allergy & Precautions, NPO status , Patient's Chart, lab work & pertinent test results  Airway Mallampati: II  TM Distance: >3 FB Neck ROM: Full    Dental no notable dental hx.    Pulmonary neg pulmonary ROS,    Pulmonary exam normal breath sounds clear to auscultation       Cardiovascular hypertension, Normal cardiovascular exam Rhythm:Regular Rate:Normal     Neuro/Psych TIAnegative psych ROS   GI/Hepatic Neg liver ROS, GERD  ,  Endo/Other  Hypothyroidism   Renal/GU negative Renal ROS  negative genitourinary   Musculoskeletal negative musculoskeletal ROS (+)   Abdominal   Peds negative pediatric ROS (+)  Hematology negative hematology ROS (+)   Anesthesia Other Findings   Reproductive/Obstetrics negative OB ROS                             Anesthesia Physical Anesthesia Plan  ASA: III  Anesthesia Plan: General   Post-op Pain Management:    Induction: Intravenous  PONV Risk Score and Plan: 3 and Ondansetron, Dexamethasone and Treatment may vary due to age or medical condition  Airway Management Planned: LMA  Additional Equipment:   Intra-op Plan:   Post-operative Plan: Extubation in OR  Informed Consent: I have reviewed the patients History and Physical, chart, labs and discussed the procedure including the risks, benefits and alternatives for the proposed anesthesia with the patient or authorized representative who has indicated his/her understanding and acceptance.   Dental advisory given  Plan Discussed with: CRNA and Surgeon  Anesthesia Plan Comments:         Anesthesia Quick Evaluation

## 2018-09-14 NOTE — Brief Op Note (Signed)
09/14/2018  9:30 AM  PATIENT:  Vicki Morris  82 y.o. female  PRE-OPERATIVE DIAGNOSIS:  STRESS FRACTURE MEDIAL TIBIAL PLATEAU LEFT KNEE AND A MEDIAL MENISCUS TEAR.  POST-OPERATIVE DIAGNOSIS:  STRESS FRACTURE MEDIAL TIBIAL PLATEAU LEFT KNEE AND A MEDIAL MENISCUS TEAR.  PROCEDURE:  Procedure(s): KNEE ARTHROSCOPY WITH SUBCHONDROPLASTY MEDIAL TIBIAL PLATEAU (Left)  SURGEON:  Surgeon(s) and Role:    Jodi Geralds* Vicki Wimer, MD - Primary  PHYSICIAN ASSISTANT:   ASSISTANTS: bethune   ANESTHESIA:   general  EBL:  minimal   BLOOD ADMINISTERED:none  DRAINS: none   LOCAL MEDICATIONS USED:  MARCAINE     SPECIMEN:  No Specimen  DISPOSITION OF SPECIMEN:  N/A  COUNTS:  YES  TOURNIQUET:  * Missing tourniquet times found for documented tourniquets in log: [id number] *  DICTATION: .Other Dictation: Dictation Number [id number]  PLAN OF CARE: Discharge to home after PACU  PATIENT DISPOSITION:  PACU - hemodynamically stable.   Delay start of Pharmacological VTE agent (>24hrs) due to surgical blood loss or risk of bleeding: no

## 2018-09-14 NOTE — Anesthesia Procedure Notes (Signed)
Procedure Name: LMA Insertion Date/Time: 09/14/2018 8:38 AM Performed by: Lorelee MarketEdathil, Maggie Dworkin T, CRNA Pre-anesthesia Checklist: Patient identified, Emergency Drugs available, Suction available, Patient being monitored and Timeout performed Patient Re-evaluated:Patient Re-evaluated prior to induction Oxygen Delivery Method: Circle system utilized Preoxygenation: Pre-oxygenation with 100% oxygen Induction Type: IV induction LMA: LMA inserted LMA Size: 3.0 Number of attempts: 1 Placement Confirmation: positive ETCO2 and breath sounds checked- equal and bilateral Tube secured with: Tape Dental Injury: Teeth and Oropharynx as per pre-operative assessment

## 2018-09-14 NOTE — Op Note (Signed)
NAME: Vicki Morris, Zahlia C. MEDICAL RECORD [id number] ACCOUNT [id number] DATE OF BIRTH:07/10/1936 FACILITY: [facility] LOCATION: [facility] PHYSICIAN:Telecia Larocque Starling MannsL. Issai Werling, MD  OPERATIVE REPORT  DATE OF PROCEDURE:  09/14/2018  PREOPERATIVE DIAGNOSES: 1.  Medial tibial plateau stress fracture. 2.  Posterior horn medial meniscal tear. 3.  Chondromalacia medial and patellofemoral.  POSTOPERATIVE DIAGNOSES: 1.  Medial tibial plateau stress fracture. 2.  Posterior horn medial meniscal tear. 3.  Chondromalacia medial and patellofemoral 4.  Lateral meniscal tear.  PROCEDURE: 1.  Internal fixation of medial tibial plateau fracture with calcium phosphate cement. 2.  Left knee arthroscopy with partial medial and lateral meniscectomies. 3.  Chondroplasty medial and patellofemoral. 4.  Interpretation of multiple intraoperative fluoroscopic images.  SURGEON:  Jodi GeraldsJohn Chrystina Naff, MD  ASSISTANT:  Gus PumaJim Bethune PA-C, was present for the entire case and assisted by manipulation of the leg holding the leg still and a closing to minimize OR time.  BRIEF HISTORY:  The patient is an 82 year old female with a history of significant left knee pain.  She had sudden onset severe inability to walk without significant pain.  X-rays looked pretty good.  We did an MRI of her, which showed a significant  medial tibial plateau stress reaction as well as a posterior horn Medial meniscal tear.  After failure of conservative care, she was taken to the operating room for arthroscopy and subchondroplasty.  We talked about treatment options including the  possibility of total knee replacement addressing all of these issues, she felt that it was too much of a surgery for her, so she was brought for this procedure.  DESCRIPTION OF PROCEDURE:  The patient brought to the operating room after adequate anesthesia was obtained with general anesthetic, the patient was placed on the operating table.  Left leg was prepped and draped in the usual  sterile fashion.  Following  this, routine arthroscopic examination of knee reveals there is an obvious chondromalacia of the patellofemoral trochlea, which was debrided back to a smooth stable rim of articular cartilage.  It was grade II and grade III in nature.  Attention turned  to the medial compartment where the medial meniscus as a posterior horn tear, which was debrided back to a smooth and stable rim posteriorly.  The medial femoral condyle had some grade II and grade III wear which was debrided.  The medial tibial plateau  has no significant chondromalacia.  The ACL was normal.  Attention turned laterally.  The lateral femoral condyle and lateral tibial plateau were normal.  There is unfortunately a large anterior horn lateral meniscal tear with a flap that was folded  back.  This meniscal tear is debrided with a suction shaver back to a smooth and stable rim and the knee at this point is copiously irrigated and suctioned dry.  At this point, attention was turned towards the removal of the arthroscopic instruments.  We  then turned towards the subchondroplasty.  We take AP and lateral fluoroscopy multiple times to make sure that we had the appropriate location for the injection of the calcium phosphate cement.  Once we identified this under fluoroscopy, we advanced the  cannula in and then placed 5 mL of cement into the medial tibial plateau area.  Once this was done, you can see a nice fan out of the injectable cement.  At this point, the 8 minutes was awaited until the cement was completely hardened and once it is,  the cannula is removed through the small incision that was made medially to allow  access to the cannula for treatment of the tibial plateau fracture.  At this point, the final fluoroscopic images were taken.  Once we were satisfied that everything is  reasonable at this point, the instruments were all removed.  The knee was copiously and thoroughly irrigated.  The sterile compressive  dressing was applied and 20 mL of 0.25% Marcaine was instilled in the knee for postoperative anesthesia.  AN/NUANCE  D:09/14/2018 T:09/14/2018 JOB:[id number]

## 2018-09-14 NOTE — Progress Notes (Signed)
PACU Phase II  Spoke to Dr Okey Dupreose about pt's oxygen sats been low 91-94 % on room air, pt using incentive spirometer . Dr Okey Dupreose is ok wit pt discharged Home with O2 sats 91-95%

## 2018-09-14 NOTE — Discharge Instructions (Signed)
POST-OP KNEE ARTHROSCOPY INSTRUCTIONS  °Dr. John Graves/Jim Samul Mcinroy PA-C ° °Pain °You will be expected to have a moderate amount of pain in the affected knee for approximately two weeks. However, the first two days will be the most severe pain. A prescription has been provided to take as needed for the pain. The pain can be reduced by applying ice packs to the knee for the first 1-2 weeks post surgery. Also, keeping the leg elevated on pillows will help alleviate the pain. If you develop any acute pain or swelling in your calf muscle, please call the doctor. ° °Activity °It is preferred that you stay at bed rest for approximately 24 hours. However, you may go to the bathroom with help. Weight bearing as tolerated. You may begin the knee exercises the day of surgery. Discontinue crutches as the knee pain resolves. ° °Dressing °Keep the dressing dry. If the ace bandage should wrinkle or roll up, this can be rewrapped to prevent ridges in the bandage. You may remove all dressings in 48 hours,  apply bandaids to each wound. You may shower on the 4th day after surgery but no tub bath. ° °Symptoms to report to your doctor °Extreme pain °Extreme swelling °Temperature above 101 degrees °Change in the feeling, color, or movement of your toes °Redness, heat, or swelling at your incision ° °Exercise °If is preferred that as soon as possible you try to do a straight leg raise without bending the knee and concentrate on bringing the heel of your foot off the bed up to approximately 45 degrees and hold for the count of 10 seconds. Repeat this at least 10 times three or four times per day. Additional exercises are provided below. ° °You are encouraged to bend the knee as tolerated. ° °Follow-Up °Call to schedule a follow-up appointment in 5-7 days. Our office # is [phone]. ° °POST-OP EXERCISES ° °Short Arc Quads ° °1. Lie on back with legs straight. Place towel roll under thigh, just above knee. °2. Tighten thigh muscles to  straighten knee and lift heel off bed. °3. Hold for slow count of five, then lower. °4. Do three sets of ten ° ° ° °Straight Leg Raises ° °1. Lie on back with operative leg straight and other leg bent. °2. Keeping operative leg completely straight, slowly lift operative leg so foot is 5 inches off bed. °3. Hold for slow count of five, then lower. °4. Do three sets of ten. ° ° ° °DO BOTH EXERCISES 2 TIMES A DAY ° °Ankle Pumps ° °Work/move the operative ankle and foot up and down 10 times every hour while awake.POST-OP KNEE ARTHROSCOPY INSTRUCTIONS  °Dr. John Graves/Jim Chang Tiggs PA-C ° °Pain °You will be expected to have a moderate amount of pain in the affected knee for approximately two weeks. However, the first two days will be the most severe pain. A prescription has been provided to take as needed for the pain. The pain can be reduced by applying ice packs to the knee for the first 1-2 weeks post surgery. Also, keeping the leg elevated on pillows will help alleviate the pain. If you develop any acute pain or swelling in your calf muscle, please call the doctor. ° °Activity °It is preferred that you stay at bed rest for approximately 24 hours. However, you may go to the bathroom with help. Weight bearing as tolerated. You may begin the knee exercises the day of surgery. Discontinue crutches as the knee pain resolves. ° °Dressing °Keep the   dressing dry. If the ace bandage should wrinkle or roll up, this can be rewrapped to prevent ridges in the bandage. You may remove all dressings in 48 hours,  apply bandaids to each wound. You may shower on the 4th day after surgery but no tub bath. ° °Symptoms to report to your doctor °Extreme pain °Extreme swelling °Temperature above 101 degrees °Change in the feeling, color, or movement of your toes °Redness, heat, or swelling at your incision ° °Exercise °If is preferred that as soon as possible you try to do a straight leg raise without bending the knee and concentrate on  bringing the heel of your foot off the bed up to approximately 45 degrees and hold for the count of 10 seconds. Repeat this at least 10 times three or four times per day. Additional exercises are provided below. ° °You are encouraged to bend the knee as tolerated. ° °Follow-Up °Call to schedule a follow-up appointment in 5-7 days. Our office # is [phone]. ° °POST-OP EXERCISES ° °Short Arc Quads ° °1. Lie on back with legs straight. Place towel roll under thigh, just above knee. °2. Tighten thigh muscles to straighten knee and lift heel off bed. °3. Hold for slow count of five, then lower. °4. Do three sets of ten ° ° ° °Straight Leg Raises ° °1. Lie on back with operative leg straight and other leg bent. °2. Keeping operative leg completely straight, slowly lift operative leg so foot is 5 inches off bed. °3. Hold for slow count of five, then lower. °4. Do three sets of ten. ° ° ° °DO BOTH EXERCISES 2 TIMES A DAY ° °Ankle Pumps ° °Work/move the operative ankle and foot up and down 10 times every hour while awake. °

## 2018-09-14 NOTE — Transfer of Care (Signed)
Immediate Anesthesia Transfer of Care Note  Patient: Vicki Morris  Procedure(s) Performed: PARITAL MEDIAL, PARTIAL LATERAL MENISCECTOMY, PATELLA FEMORAL SUBCHONDROPLASTY, TIBIAL PLATEAU KNEE ARTHROSCOPY (Left Knee)  Patient Location: PACU  Anesthesia Type:General  Level of Consciousness: awake, alert , oriented and patient cooperative  Airway & Oxygen Therapy: Patient Spontanous Breathing and Patient connected to face mask oxygen  Post-op Assessment: Report given to RN, Post -op Vital signs reviewed and stable and Patient moving all extremities  Post vital signs: Reviewed and stable  Last Vitals:  Vitals Value Taken Time  BP 146/70 09/14/2018  9:40 AM  Temp    Pulse 67 09/14/2018  9:44 AM  Resp 16 09/14/2018  9:44 AM  SpO2 94 % 09/14/2018  9:44 AM  Vitals shown include unvalidated device data.  Last Pain:  Vitals:   09/14/18 0706  TempSrc: Oral         Complications: No apparent anesthesia complications

## 2018-09-19 ENCOUNTER — Encounter (HOSPITAL_COMMUNITY): Payer: Self-pay | Admitting: Orthopedic Surgery

## 2019-02-09 ENCOUNTER — Telehealth: Payer: Self-pay | Admitting: Cardiology

## 2019-02-09 NOTE — Telephone Encounter (Signed)
LVM regarding video or phone visit. °

## 2019-02-17 ENCOUNTER — Telehealth: Payer: Self-pay | Admitting: Cardiology

## 2019-02-17 NOTE — Telephone Encounter (Signed)
Mychart, no smartphone, consent (verbal) pre reg complete 02/17/19 AF

## 2019-02-18 NOTE — Progress Notes (Signed)
Virtual Visit via Telephone Note   This visit type was conducted due to national recommendations for restrictions regarding the COVID-19 Pandemic (e.g. social distancing) in an effort to limit this patient's exposure and mitigate transmission in our community.  Due to her co-morbid illnesses, this patient is at least at moderate risk for complications without adequate follow up.  This format is felt to be most appropriate for this patient at this time.  The patient did not have access to video technology/had technical difficulties with video requiring transitioning to audio format only (telephone).  All issues noted in this document were discussed and addressed.  No physical exam could be performed with this format.  Please refer to the patient's chart for her  consent to telehealth for [facility].   Date:  02/20/2019   ID:  Vicki Morris, DOB 04/19/1936, MRN [id number]  Patient Location: Home Provider Location: Home  PCP:  Vicki BrunettePharr, Walter, MD  Cardiologist:  Vicki Rinehart [place], MD  Electrophysiologist:  None   Evaluation Performed:  Follow-Up Visit  Chief Complaint:  Follow up Afib  History of Present Illness:    Vicki Morris is a 83 y.o. female seen for follow up Afib. She has a PMH of HTN, HLD, hypothyroidism, PAF on chronic Coumadin therapy, GERD and a history of TIA.  Myoview and echo performed in June 2015 were unremarkable.   She was seen in the emergency room in December 2018 for chest pain.  She was admitted briefly.  Serial troponin was negative.  Potassium on arrival was low, however this was repleted.  Echocardiogram obtained on 09/21/2017 showed EF 55 to 60%, grade 1 DD, trivial mitral regurgitation, trivial aortic regurgitation, moderately dilated left atrium.  She was seen in the hospital by cardiology service, chest pain was felt to be very atypical.  Outpatient stress test was recommended.  She had Myoview performed on 09/30/2017 which showed EF 51%, small defect of mild  severity in the basal inferolateral location which is partially reversible in the likely related to variation in the diaphragmatic attenuation artifact.  There was small defect of moderate severity in the apex location which is nonreversible consistent with attenuation artifact as well, no obvious ischemia was noted.  Overall this is considered a low risk study.   In December 2019 she had left knee surgery without complications. On follow up today she is still somewhat deconditioned. She enjoys working in her flowers. Denies any real episodes of Afib but occasionally will note a skipped beat.   The patient does not have symptoms concerning for COVID-19 infection (fever, chills, cough, or new shortness of breath).    Past Medical History:  Diagnosis Date   Allergic rhinitis, cause unspecified    Anxiety    Arthritis    Chronic anticoagulation    Depression    Dysrhythmia    afib    GERD (gastroesophageal reflux disease)    History of TIAs    Hyperlipidemia    Hypertension    Hypothyroidism    Normal nuclear stress test March 2013   EF 64%, clinically and electrically negative for ischemia. Myoview scan with apical thinning, could not exclude small subendocardial scar. No ischemia.    Obesity    PAF (paroxysmal atrial fibrillation) (HCC)    Peripheral vascular disease (HCC)    Pneumonia    hx of    Polyp of nasal cavity    PONV (postoperative nausea and vomiting)    Stroke (HCC)    mini stroke  years ago    Urinary tract infection    hx of    Past Surgical History:  Procedure Laterality Date   BLADDER SURGERY     CARPAL TUNNEL RELEASE     KNEE ARTHROSCOPY Left 09/14/2018   Procedure: PARITAL MEDIAL, PARTIAL LATERAL MENISCECTOMY, PATELLA FEMORAL SUBCHONDROPLASTY, TIBIAL PLATEAU KNEE ARTHROSCOPY;  Surgeon: Jodi Geralds, MD;  Location: [facility];  Service: Orthopedics;  Laterality: Left;   NASAL SINUS SURGERY     right little toe surgery      TOTAL  ABDOMINAL HYSTERECTOMY W/ BILATERAL SALPINGOOPHORECTOMY     US ECHOCARDIOGRAPHY  01/23/2010   EF 55-60%     Current Meds  Medication Sig   amitriptyline (ELAVIL) 10 MG tablet TK 1 T PO HS   betamethasone valerate (VALISONE) 0.1 % cream Apply 1 application topically every other day.   diltiazem (CARDIZEM CD) 240 MG 24 hr capsule Take 240 mg by mouth at bedtime.    diphenhydrAMINE (BENADRYL) 25 mg capsule Take 25 mg by mouth daily as needed for allergies.    EPINEPHrine (EPIPEN 2-PAK) 0.3 mg/0.3 mL IJ SOAJ injection Inject 0.3 mg into the muscle once.   LORazepam (ATIVAN) 0.5 MG tablet Take 0.5 mg by mouth at bedtime as needed for sleep.    olmesartan-hydrochlorothiazide (BENICAR HCT) 20-12.5 MG tablet Take 1 tablet by mouth daily.   omeprazole (PRILOSEC) 20 MG capsule Take 20 mg by mouth daily after breakfast.    potassium chloride (KLOR-CON) 20 MEQ packet Take 20 mEq by mouth every other day.    rosuvastatin (CRESTOR) 10 MG tablet Take 10 mg by mouth at bedtime.    sertraline (ZOLOFT) 50 MG tablet Take 50 mg by mouth at bedtime.    SYNTHROID 100 MCG tablet Take 100 mcg by mouth daily before breakfast.    trimethoprim (TRIMPEX) 100 MG tablet Take 100 mg by mouth daily.    warfarin (COUMADIN) 2 MG tablet Take 1-2 mg by mouth See admin instructions. Take 1 mg by mouth daily on Monday, Wednesday and Friday. Take 2 mg by mouth daily on all other days.     Allergies:   Other; Sulfonamide derivatives; Clarithromycin; and Nabumetone   Social History   Tobacco Use   Smoking status: Never Smoker   Smokeless tobacco: Never Used  Substance Use Topics   Alcohol use: No   Drug use: No     Family Hx: The patient's family history includes Breast cancer in her paternal aunt; Heart attack in her father; Heart disease in her father and mother.  ROS:   Please see the history of present illness.    All other systems reviewed and are negative.   Prior CV studies:   The  following studies were reviewed today:  Myoview 09/30/17: Study Highlights    The left ventricular ejection fraction is mildly decreased (45-54%).  Nuclear stress EF: 51%.  There was no ST segment deviation noted during stress.  There is a small defect of mild severity present in the basal inferolateral location. The defect is partially reversible and likely related to variations in diaphragmatic attenuation artifact. There is a small defect of moderate severity present in the apex location. The defect is non-reversible and in setting of normal LVF is consistent with attenuation artifact. No ischemia noted.  This is a low risk study.  No change from prior study of 2015.   Echo 09/21/17: Study Conclusions  - Left ventricle: The cavity size was normal. Wall thickness was   normal. Systolic  function was normal. The estimated ejection   fraction was in the range of 55% to 60%. Wall motion was normal;   there were no regional wall motion abnormalities. Doppler   parameters are consistent with abnormal left ventricular   relaxation (grade 1 diastolic dysfunction). The E/e&' ratio is   between 8-15, suggesting indeterminate LV fililng pressure. - Aortic valve: Trileaflet. Sclerosis without stenosis. There was   trivial regurgitation. - Mitral valve: Mildly thickened leaflets . There was trivial   regurgitation. - Left atrium: Moderately dilated. - Right atrium: The atrium was mildly dilated. - Inferior vena cava: The vessel was normal in size. The   respirophasic diameter changes were in the normal range (= 50%),   consistent with normal central venous pressure.  Impressions:  - Compared to a prior study in 2015, there have been few changes.   The left atrium is now moderately dilated.   Labs/Other Tests and Data Reviewed:    EKG:  No ECG reviewed.  Recent Labs: 09/09/2018: BUN 21; Creatinine, Ser 0.88; Hemoglobin 11.5; Platelets 309; Potassium 3.7; Sodium 142   Recent  Lipid Panel No results found for: CHOL, TRIG, HDL, CHOLHDL, LDLCALC, LDLDIRECT   Dated 02/22/18: cholesterol 173, triglycerides 202, HDL 46, LDL 87.   Wt Readings from Last 3 Encounters:  09/09/18 194 lb (88 kg)  08/22/18 199 lb (90.3 kg)  10/07/17 197 lb (89.4 kg)     Objective:    Vital Signs:  BP 131/70    Pulse 62    Ht 5\' 4"  (1.626 m)    BMI 33.30 kg/m    VITAL SIGNS:  reviewed  ASSESSMENT & PLAN:    1.Paroxysmal atrial fibrillation: Coumadin managed by her PCPs office and has been therapeutic. On Cardizem for rate control.   2.Hypertension: Blood pressure well controlled on current medication  3.Hyperlipidemia: On Crestor 10 mg daily. She is scheduled for yearly lab work with Dr Renne Crigler  4. Hypothyroidism: On Synthroid.  Managed by primary care provider.   COVID-19 Education: The signs and symptoms of COVID-19 were discussed with the patient and how to seek care for testing (follow up with PCP or arrange E-visit).  The importance of social distancing was discussed today.  Time:   Today, I have spent 10 minutes with the patient with telehealth technology discussing the above problems.     Medication Adjustments/Labs and Tests Ordered: Current medicines are reviewed at length with the patient today.  Concerns regarding medicines are outlined above.   Tests Ordered: No orders of the defined types were placed in this encounter.   Medication Changes: No orders of the defined types were placed in this encounter.   Disposition:  Follow up in 1 year(s)  Signed, Indiya Izquierdo [place], MD  02/20/2019 10:26 AM     [facility]

## 2019-02-20 ENCOUNTER — Telehealth (INDEPENDENT_AMBULATORY_CARE_PROVIDER_SITE_OTHER): Payer: Medicare Other | Admitting: Cardiology

## 2019-02-20 ENCOUNTER — Encounter: Payer: Self-pay | Admitting: Cardiology

## 2019-02-20 VITALS — BP 131/70 | HR 62 | Ht 64.0 in

## 2019-02-20 DIAGNOSIS — I480 Paroxysmal atrial fibrillation: Secondary | ICD-10-CM | POA: Diagnosis not present

## 2019-02-20 DIAGNOSIS — I10 Essential (primary) hypertension: Secondary | ICD-10-CM

## 2019-02-20 DIAGNOSIS — E785 Hyperlipidemia, unspecified: Secondary | ICD-10-CM

## 2019-02-20 NOTE — Patient Instructions (Addendum)
Continue your current medication  Follow up in one year    Call 3 months before to schedule

## 2019-03-20 ENCOUNTER — Telehealth: Payer: Self-pay

## 2019-03-20 NOTE — Telephone Encounter (Signed)
   [facility] Pre-operative Risk Assessment    Request for surgical clearance:  1. What type of surgery is being performed? enteroscopy and colonoscopy   2. When is this surgery scheduled? 03/30/2019   3. What type of clearance is required (medical clearance vs. Pharmacy clearance to hold med vs. Both)? medical  4. Are there any medications that need to be held prior to surgery and how long?   5. Practice name and name of physician performing surgery? [facility] , dr. Carol Ada  6. What is your office phone number [phone]    7.   What is your office fax number [phone]  8.   Anesthesia type (None, local, MAC, general) ? prpofol   Vicki Morris 03/20/2019, 4:35 PM  _________________________________________________________________   (provider comments below)

## 2019-03-21 NOTE — Telephone Encounter (Signed)
   Primary Cardiologist: Peter Martinique, MD  Chart reviewed as part of pre-operative protocol coverage.   Dr. Martinique, you recently saw the patient and was doing well. Can you cleared for procedure? Please forward your response to P CV DIV PREOP.   Thank you   Leanor Kail, PA 03/21/2019, 10:26 AM

## 2019-03-21 NOTE — Telephone Encounter (Signed)
She is cleared. I don't think she needs bridging lovenox. Can stop coumadin 5 days prior  Lurine Imel Martinique MD, [facility]

## 2019-03-30 ENCOUNTER — Encounter: Payer: Self-pay | Admitting: Gastroenterology

## 2019-10-10 DIAGNOSIS — Z91018 Allergy to other foods: Secondary | ICD-10-CM | POA: Diagnosis not present

## 2019-10-10 DIAGNOSIS — I10 Essential (primary) hypertension: Secondary | ICD-10-CM | POA: Diagnosis not present

## 2019-10-18 DIAGNOSIS — T781XXA Other adverse food reactions, not elsewhere classified, initial encounter: Secondary | ICD-10-CM | POA: Diagnosis not present

## 2019-10-18 DIAGNOSIS — T781XXD Other adverse food reactions, not elsewhere classified, subsequent encounter: Secondary | ICD-10-CM | POA: Diagnosis not present

## 2019-10-18 DIAGNOSIS — R05 Cough: Secondary | ICD-10-CM | POA: Diagnosis not present

## 2019-10-18 DIAGNOSIS — J300 Vasomotor rhinitis: Secondary | ICD-10-CM | POA: Diagnosis not present

## 2019-10-18 DIAGNOSIS — J309 Allergic rhinitis, unspecified: Secondary | ICD-10-CM | POA: Diagnosis not present

## 2019-11-15 DIAGNOSIS — S32020A Wedge compression fracture of second lumbar vertebra, initial encounter for closed fracture: Secondary | ICD-10-CM | POA: Diagnosis not present

## 2019-11-15 DIAGNOSIS — M545 Low back pain: Secondary | ICD-10-CM | POA: Diagnosis not present

## 2019-11-15 DIAGNOSIS — Z03818 Encounter for observation for suspected exposure to other biological agents ruled out: Secondary | ICD-10-CM | POA: Diagnosis not present

## 2019-11-15 DIAGNOSIS — W19XXXA Unspecified fall, initial encounter: Secondary | ICD-10-CM | POA: Diagnosis not present

## 2019-11-15 DIAGNOSIS — K219 Gastro-esophageal reflux disease without esophagitis: Secondary | ICD-10-CM | POA: Diagnosis not present

## 2019-11-15 DIAGNOSIS — S299XXA Unspecified injury of thorax, initial encounter: Secondary | ICD-10-CM | POA: Diagnosis not present

## 2019-11-15 DIAGNOSIS — E039 Hypothyroidism, unspecified: Secondary | ICD-10-CM | POA: Diagnosis not present

## 2019-11-15 DIAGNOSIS — E7800 Pure hypercholesterolemia, unspecified: Secondary | ICD-10-CM | POA: Diagnosis not present

## 2019-11-15 DIAGNOSIS — I10 Essential (primary) hypertension: Secondary | ICD-10-CM | POA: Diagnosis not present

## 2019-11-15 DIAGNOSIS — Z9181 History of falling: Secondary | ICD-10-CM | POA: Diagnosis not present

## 2019-11-15 DIAGNOSIS — Z7901 Long term (current) use of anticoagulants: Secondary | ICD-10-CM | POA: Diagnosis not present

## 2019-11-15 DIAGNOSIS — Z743 Need for continuous supervision: Secondary | ICD-10-CM | POA: Diagnosis not present

## 2019-11-15 DIAGNOSIS — R52 Pain, unspecified: Secondary | ICD-10-CM | POA: Diagnosis not present

## 2019-11-15 DIAGNOSIS — I160 Hypertensive urgency: Secondary | ICD-10-CM | POA: Diagnosis not present

## 2019-11-15 DIAGNOSIS — I4891 Unspecified atrial fibrillation: Secondary | ICD-10-CM | POA: Diagnosis not present

## 2019-11-16 DIAGNOSIS — S32020A Wedge compression fracture of second lumbar vertebra, initial encounter for closed fracture: Secondary | ICD-10-CM | POA: Diagnosis not present

## 2019-11-16 DIAGNOSIS — K219 Gastro-esophageal reflux disease without esophagitis: Secondary | ICD-10-CM | POA: Diagnosis not present

## 2019-11-16 DIAGNOSIS — I10 Essential (primary) hypertension: Secondary | ICD-10-CM | POA: Diagnosis not present

## 2019-11-16 DIAGNOSIS — E7800 Pure hypercholesterolemia, unspecified: Secondary | ICD-10-CM | POA: Diagnosis not present

## 2019-11-16 DIAGNOSIS — I160 Hypertensive urgency: Secondary | ICD-10-CM | POA: Diagnosis not present

## 2019-11-16 DIAGNOSIS — I4891 Unspecified atrial fibrillation: Secondary | ICD-10-CM | POA: Diagnosis not present

## 2019-11-16 DIAGNOSIS — Z7901 Long term (current) use of anticoagulants: Secondary | ICD-10-CM | POA: Diagnosis not present

## 2019-11-16 DIAGNOSIS — E039 Hypothyroidism, unspecified: Secondary | ICD-10-CM | POA: Diagnosis not present

## 2019-11-16 DIAGNOSIS — Z9181 History of falling: Secondary | ICD-10-CM | POA: Diagnosis not present

## 2019-11-17 DIAGNOSIS — I10 Essential (primary) hypertension: Secondary | ICD-10-CM | POA: Diagnosis not present

## 2019-11-17 DIAGNOSIS — K219 Gastro-esophageal reflux disease without esophagitis: Secondary | ICD-10-CM | POA: Diagnosis not present

## 2019-11-17 DIAGNOSIS — I4891 Unspecified atrial fibrillation: Secondary | ICD-10-CM | POA: Diagnosis not present

## 2019-11-17 DIAGNOSIS — E039 Hypothyroidism, unspecified: Secondary | ICD-10-CM | POA: Diagnosis not present

## 2019-11-17 DIAGNOSIS — Z9181 History of falling: Secondary | ICD-10-CM | POA: Diagnosis not present

## 2019-11-17 DIAGNOSIS — Z7901 Long term (current) use of anticoagulants: Secondary | ICD-10-CM | POA: Diagnosis not present

## 2019-11-17 DIAGNOSIS — E7800 Pure hypercholesterolemia, unspecified: Secondary | ICD-10-CM | POA: Diagnosis not present

## 2019-11-17 DIAGNOSIS — I160 Hypertensive urgency: Secondary | ICD-10-CM | POA: Diagnosis not present

## 2019-11-17 DIAGNOSIS — S32020A Wedge compression fracture of second lumbar vertebra, initial encounter for closed fracture: Secondary | ICD-10-CM | POA: Diagnosis not present

## 2019-11-18 DIAGNOSIS — Z7901 Long term (current) use of anticoagulants: Secondary | ICD-10-CM | POA: Diagnosis not present

## 2019-11-18 DIAGNOSIS — I160 Hypertensive urgency: Secondary | ICD-10-CM | POA: Diagnosis not present

## 2019-11-18 DIAGNOSIS — I4891 Unspecified atrial fibrillation: Secondary | ICD-10-CM | POA: Diagnosis not present

## 2019-11-18 DIAGNOSIS — I10 Essential (primary) hypertension: Secondary | ICD-10-CM | POA: Diagnosis not present

## 2019-11-18 DIAGNOSIS — E039 Hypothyroidism, unspecified: Secondary | ICD-10-CM | POA: Diagnosis not present

## 2019-11-18 DIAGNOSIS — K219 Gastro-esophageal reflux disease without esophagitis: Secondary | ICD-10-CM | POA: Diagnosis not present

## 2019-11-18 DIAGNOSIS — Z9181 History of falling: Secondary | ICD-10-CM | POA: Diagnosis not present

## 2019-11-18 DIAGNOSIS — E7800 Pure hypercholesterolemia, unspecified: Secondary | ICD-10-CM | POA: Diagnosis not present

## 2019-11-18 DIAGNOSIS — S32020A Wedge compression fracture of second lumbar vertebra, initial encounter for closed fracture: Secondary | ICD-10-CM | POA: Diagnosis not present

## 2019-11-19 DIAGNOSIS — I10 Essential (primary) hypertension: Secondary | ICD-10-CM | POA: Diagnosis not present

## 2019-11-19 DIAGNOSIS — E7800 Pure hypercholesterolemia, unspecified: Secondary | ICD-10-CM | POA: Diagnosis not present

## 2019-11-19 DIAGNOSIS — K219 Gastro-esophageal reflux disease without esophagitis: Secondary | ICD-10-CM | POA: Diagnosis not present

## 2019-11-19 DIAGNOSIS — E039 Hypothyroidism, unspecified: Secondary | ICD-10-CM | POA: Diagnosis not present

## 2019-11-19 DIAGNOSIS — I4891 Unspecified atrial fibrillation: Secondary | ICD-10-CM | POA: Diagnosis not present

## 2019-11-19 DIAGNOSIS — S32020A Wedge compression fracture of second lumbar vertebra, initial encounter for closed fracture: Secondary | ICD-10-CM | POA: Diagnosis not present

## 2019-11-19 DIAGNOSIS — I160 Hypertensive urgency: Secondary | ICD-10-CM | POA: Diagnosis not present

## 2019-11-19 DIAGNOSIS — Z7901 Long term (current) use of anticoagulants: Secondary | ICD-10-CM | POA: Diagnosis not present

## 2019-11-19 DIAGNOSIS — Z9181 History of falling: Secondary | ICD-10-CM | POA: Diagnosis not present

## 2019-11-20 DIAGNOSIS — E039 Hypothyroidism, unspecified: Secondary | ICD-10-CM | POA: Diagnosis not present

## 2019-11-20 DIAGNOSIS — M2550 Pain in unspecified joint: Secondary | ICD-10-CM | POA: Diagnosis not present

## 2019-11-20 DIAGNOSIS — R0902 Hypoxemia: Secondary | ICD-10-CM | POA: Diagnosis not present

## 2019-11-20 DIAGNOSIS — I160 Hypertensive urgency: Secondary | ICD-10-CM | POA: Diagnosis not present

## 2019-11-20 DIAGNOSIS — K219 Gastro-esophageal reflux disease without esophagitis: Secondary | ICD-10-CM | POA: Diagnosis not present

## 2019-11-20 DIAGNOSIS — Z7401 Bed confinement status: Secondary | ICD-10-CM | POA: Diagnosis not present

## 2019-11-20 DIAGNOSIS — M1990 Unspecified osteoarthritis, unspecified site: Secondary | ICD-10-CM | POA: Diagnosis not present

## 2019-11-20 DIAGNOSIS — Z8673 Personal history of transient ischemic attack (TIA), and cerebral infarction without residual deficits: Secondary | ICD-10-CM | POA: Diagnosis not present

## 2019-11-20 DIAGNOSIS — E785 Hyperlipidemia, unspecified: Secondary | ICD-10-CM | POA: Diagnosis not present

## 2019-11-20 DIAGNOSIS — M439 Deforming dorsopathy, unspecified: Secondary | ICD-10-CM | POA: Diagnosis not present

## 2019-11-20 DIAGNOSIS — Z20828 Contact with and (suspected) exposure to other viral communicable diseases: Secondary | ICD-10-CM | POA: Diagnosis not present

## 2019-11-20 DIAGNOSIS — S32020A Wedge compression fracture of second lumbar vertebra, initial encounter for closed fracture: Secondary | ICD-10-CM | POA: Diagnosis not present

## 2019-11-20 DIAGNOSIS — I4891 Unspecified atrial fibrillation: Secondary | ICD-10-CM | POA: Diagnosis not present

## 2019-11-20 DIAGNOSIS — K5900 Constipation, unspecified: Secondary | ICD-10-CM | POA: Diagnosis not present

## 2019-11-20 DIAGNOSIS — Z7901 Long term (current) use of anticoagulants: Secondary | ICD-10-CM | POA: Diagnosis not present

## 2019-11-20 DIAGNOSIS — D509 Iron deficiency anemia, unspecified: Secondary | ICD-10-CM | POA: Diagnosis not present

## 2019-11-20 DIAGNOSIS — I10 Essential (primary) hypertension: Secondary | ICD-10-CM | POA: Diagnosis not present

## 2019-11-20 DIAGNOSIS — S32020D Wedge compression fracture of second lumbar vertebra, subsequent encounter for fracture with routine healing: Secondary | ICD-10-CM | POA: Diagnosis not present

## 2019-11-20 DIAGNOSIS — R262 Difficulty in walking, not elsewhere classified: Secondary | ICD-10-CM | POA: Diagnosis not present

## 2019-11-20 DIAGNOSIS — Z9181 History of falling: Secondary | ICD-10-CM | POA: Diagnosis not present

## 2019-11-20 DIAGNOSIS — R52 Pain, unspecified: Secondary | ICD-10-CM | POA: Diagnosis not present

## 2019-11-20 DIAGNOSIS — D649 Anemia, unspecified: Secondary | ICD-10-CM | POA: Diagnosis not present

## 2019-11-20 DIAGNOSIS — E7800 Pure hypercholesterolemia, unspecified: Secondary | ICD-10-CM | POA: Diagnosis not present

## 2019-11-20 DIAGNOSIS — Z743 Need for continuous supervision: Secondary | ICD-10-CM | POA: Diagnosis not present

## 2019-11-23 DIAGNOSIS — R262 Difficulty in walking, not elsewhere classified: Secondary | ICD-10-CM | POA: Diagnosis not present

## 2019-11-23 DIAGNOSIS — M439 Deforming dorsopathy, unspecified: Secondary | ICD-10-CM | POA: Diagnosis not present

## 2019-11-23 DIAGNOSIS — S32020D Wedge compression fracture of second lumbar vertebra, subsequent encounter for fracture with routine healing: Secondary | ICD-10-CM | POA: Diagnosis not present

## 2019-11-23 DIAGNOSIS — I4891 Unspecified atrial fibrillation: Secondary | ICD-10-CM | POA: Diagnosis not present

## 2019-11-29 DIAGNOSIS — M1990 Unspecified osteoarthritis, unspecified site: Secondary | ICD-10-CM | POA: Diagnosis not present

## 2019-11-29 DIAGNOSIS — K219 Gastro-esophageal reflux disease without esophagitis: Secondary | ICD-10-CM | POA: Diagnosis not present

## 2019-11-29 DIAGNOSIS — S32020A Wedge compression fracture of second lumbar vertebra, initial encounter for closed fracture: Secondary | ICD-10-CM | POA: Diagnosis not present

## 2019-11-29 DIAGNOSIS — Z8673 Personal history of transient ischemic attack (TIA), and cerebral infarction without residual deficits: Secondary | ICD-10-CM | POA: Diagnosis not present

## 2019-11-29 DIAGNOSIS — E039 Hypothyroidism, unspecified: Secondary | ICD-10-CM | POA: Diagnosis not present

## 2019-11-29 DIAGNOSIS — I4891 Unspecified atrial fibrillation: Secondary | ICD-10-CM | POA: Diagnosis not present

## 2019-11-29 DIAGNOSIS — K5900 Constipation, unspecified: Secondary | ICD-10-CM | POA: Diagnosis not present

## 2019-11-29 DIAGNOSIS — E785 Hyperlipidemia, unspecified: Secondary | ICD-10-CM | POA: Diagnosis not present

## 2019-11-29 DIAGNOSIS — I10 Essential (primary) hypertension: Secondary | ICD-10-CM | POA: Diagnosis not present

## 2019-12-10 DIAGNOSIS — G8929 Other chronic pain: Secondary | ICD-10-CM | POA: Diagnosis not present

## 2019-12-10 DIAGNOSIS — F329 Major depressive disorder, single episode, unspecified: Secondary | ICD-10-CM | POA: Diagnosis not present

## 2019-12-10 DIAGNOSIS — M47816 Spondylosis without myelopathy or radiculopathy, lumbar region: Secondary | ICD-10-CM | POA: Diagnosis not present

## 2019-12-10 DIAGNOSIS — I129 Hypertensive chronic kidney disease with stage 1 through stage 4 chronic kidney disease, or unspecified chronic kidney disease: Secondary | ICD-10-CM | POA: Diagnosis not present

## 2019-12-10 DIAGNOSIS — E039 Hypothyroidism, unspecified: Secondary | ICD-10-CM | POA: Diagnosis not present

## 2019-12-10 DIAGNOSIS — E7800 Pure hypercholesterolemia, unspecified: Secondary | ICD-10-CM | POA: Diagnosis not present

## 2019-12-10 DIAGNOSIS — N189 Chronic kidney disease, unspecified: Secondary | ICD-10-CM | POA: Diagnosis not present

## 2019-12-10 DIAGNOSIS — I4891 Unspecified atrial fibrillation: Secondary | ICD-10-CM | POA: Diagnosis not present

## 2019-12-10 DIAGNOSIS — M8008XD Age-related osteoporosis with current pathological fracture, vertebra(e), subsequent encounter for fracture with routine healing: Secondary | ICD-10-CM | POA: Diagnosis not present

## 2019-12-11 DIAGNOSIS — E7800 Pure hypercholesterolemia, unspecified: Secondary | ICD-10-CM | POA: Diagnosis not present

## 2019-12-11 DIAGNOSIS — N189 Chronic kidney disease, unspecified: Secondary | ICD-10-CM | POA: Diagnosis not present

## 2019-12-11 DIAGNOSIS — F329 Major depressive disorder, single episode, unspecified: Secondary | ICD-10-CM | POA: Diagnosis not present

## 2019-12-11 DIAGNOSIS — M8008XD Age-related osteoporosis with current pathological fracture, vertebra(e), subsequent encounter for fracture with routine healing: Secondary | ICD-10-CM | POA: Diagnosis not present

## 2019-12-11 DIAGNOSIS — E039 Hypothyroidism, unspecified: Secondary | ICD-10-CM | POA: Diagnosis not present

## 2019-12-11 DIAGNOSIS — G8929 Other chronic pain: Secondary | ICD-10-CM | POA: Diagnosis not present

## 2019-12-11 DIAGNOSIS — I129 Hypertensive chronic kidney disease with stage 1 through stage 4 chronic kidney disease, or unspecified chronic kidney disease: Secondary | ICD-10-CM | POA: Diagnosis not present

## 2019-12-11 DIAGNOSIS — I4891 Unspecified atrial fibrillation: Secondary | ICD-10-CM | POA: Diagnosis not present

## 2019-12-11 DIAGNOSIS — M47816 Spondylosis without myelopathy or radiculopathy, lumbar region: Secondary | ICD-10-CM | POA: Diagnosis not present

## 2019-12-14 DIAGNOSIS — E039 Hypothyroidism, unspecified: Secondary | ICD-10-CM | POA: Diagnosis not present

## 2019-12-14 DIAGNOSIS — N189 Chronic kidney disease, unspecified: Secondary | ICD-10-CM | POA: Diagnosis not present

## 2019-12-14 DIAGNOSIS — E7800 Pure hypercholesterolemia, unspecified: Secondary | ICD-10-CM | POA: Diagnosis not present

## 2019-12-14 DIAGNOSIS — M47816 Spondylosis without myelopathy or radiculopathy, lumbar region: Secondary | ICD-10-CM | POA: Diagnosis not present

## 2019-12-14 DIAGNOSIS — F329 Major depressive disorder, single episode, unspecified: Secondary | ICD-10-CM | POA: Diagnosis not present

## 2019-12-14 DIAGNOSIS — M8008XD Age-related osteoporosis with current pathological fracture, vertebra(e), subsequent encounter for fracture with routine healing: Secondary | ICD-10-CM | POA: Diagnosis not present

## 2019-12-14 DIAGNOSIS — G8929 Other chronic pain: Secondary | ICD-10-CM | POA: Diagnosis not present

## 2019-12-14 DIAGNOSIS — I129 Hypertensive chronic kidney disease with stage 1 through stage 4 chronic kidney disease, or unspecified chronic kidney disease: Secondary | ICD-10-CM | POA: Diagnosis not present

## 2019-12-14 DIAGNOSIS — I4891 Unspecified atrial fibrillation: Secondary | ICD-10-CM | POA: Diagnosis not present

## 2019-12-19 DIAGNOSIS — F5101 Primary insomnia: Secondary | ICD-10-CM | POA: Diagnosis not present

## 2019-12-19 DIAGNOSIS — R300 Dysuria: Secondary | ICD-10-CM | POA: Diagnosis not present

## 2019-12-19 DIAGNOSIS — E559 Vitamin D deficiency, unspecified: Secondary | ICD-10-CM | POA: Diagnosis not present

## 2019-12-19 DIAGNOSIS — E039 Hypothyroidism, unspecified: Secondary | ICD-10-CM | POA: Diagnosis not present

## 2019-12-19 DIAGNOSIS — Z09 Encounter for follow-up examination after completed treatment for conditions other than malignant neoplasm: Secondary | ICD-10-CM | POA: Diagnosis not present

## 2019-12-19 DIAGNOSIS — I10 Essential (primary) hypertension: Secondary | ICD-10-CM | POA: Diagnosis not present

## 2019-12-19 DIAGNOSIS — I4891 Unspecified atrial fibrillation: Secondary | ICD-10-CM | POA: Diagnosis not present

## 2019-12-19 DIAGNOSIS — E876 Hypokalemia: Secondary | ICD-10-CM | POA: Diagnosis not present

## 2019-12-19 DIAGNOSIS — M8000XA Age-related osteoporosis with current pathological fracture, unspecified site, initial encounter for fracture: Secondary | ICD-10-CM | POA: Diagnosis not present

## 2019-12-19 DIAGNOSIS — N390 Urinary tract infection, site not specified: Secondary | ICD-10-CM | POA: Diagnosis not present

## 2019-12-20 DIAGNOSIS — I4891 Unspecified atrial fibrillation: Secondary | ICD-10-CM | POA: Diagnosis not present

## 2019-12-20 DIAGNOSIS — E7800 Pure hypercholesterolemia, unspecified: Secondary | ICD-10-CM | POA: Diagnosis not present

## 2019-12-20 DIAGNOSIS — N189 Chronic kidney disease, unspecified: Secondary | ICD-10-CM | POA: Diagnosis not present

## 2019-12-20 DIAGNOSIS — M8008XD Age-related osteoporosis with current pathological fracture, vertebra(e), subsequent encounter for fracture with routine healing: Secondary | ICD-10-CM | POA: Diagnosis not present

## 2019-12-20 DIAGNOSIS — G8929 Other chronic pain: Secondary | ICD-10-CM | POA: Diagnosis not present

## 2019-12-20 DIAGNOSIS — E039 Hypothyroidism, unspecified: Secondary | ICD-10-CM | POA: Diagnosis not present

## 2019-12-20 DIAGNOSIS — I129 Hypertensive chronic kidney disease with stage 1 through stage 4 chronic kidney disease, or unspecified chronic kidney disease: Secondary | ICD-10-CM | POA: Diagnosis not present

## 2019-12-20 DIAGNOSIS — F329 Major depressive disorder, single episode, unspecified: Secondary | ICD-10-CM | POA: Diagnosis not present

## 2019-12-20 DIAGNOSIS — M47816 Spondylosis without myelopathy or radiculopathy, lumbar region: Secondary | ICD-10-CM | POA: Diagnosis not present

## 2019-12-29 DIAGNOSIS — G8929 Other chronic pain: Secondary | ICD-10-CM | POA: Diagnosis not present

## 2019-12-29 DIAGNOSIS — I129 Hypertensive chronic kidney disease with stage 1 through stage 4 chronic kidney disease, or unspecified chronic kidney disease: Secondary | ICD-10-CM | POA: Diagnosis not present

## 2019-12-29 DIAGNOSIS — M47816 Spondylosis without myelopathy or radiculopathy, lumbar region: Secondary | ICD-10-CM | POA: Diagnosis not present

## 2019-12-29 DIAGNOSIS — M8008XD Age-related osteoporosis with current pathological fracture, vertebra(e), subsequent encounter for fracture with routine healing: Secondary | ICD-10-CM | POA: Diagnosis not present

## 2019-12-29 DIAGNOSIS — I4891 Unspecified atrial fibrillation: Secondary | ICD-10-CM | POA: Diagnosis not present

## 2019-12-29 DIAGNOSIS — E039 Hypothyroidism, unspecified: Secondary | ICD-10-CM | POA: Diagnosis not present

## 2019-12-29 DIAGNOSIS — F329 Major depressive disorder, single episode, unspecified: Secondary | ICD-10-CM | POA: Diagnosis not present

## 2019-12-29 DIAGNOSIS — N189 Chronic kidney disease, unspecified: Secondary | ICD-10-CM | POA: Diagnosis not present

## 2019-12-29 DIAGNOSIS — E7800 Pure hypercholesterolemia, unspecified: Secondary | ICD-10-CM | POA: Diagnosis not present

## 2020-01-02 DIAGNOSIS — E876 Hypokalemia: Secondary | ICD-10-CM | POA: Diagnosis not present

## 2020-01-02 DIAGNOSIS — M810 Age-related osteoporosis without current pathological fracture: Secondary | ICD-10-CM | POA: Diagnosis not present

## 2020-01-03 DIAGNOSIS — F329 Major depressive disorder, single episode, unspecified: Secondary | ICD-10-CM | POA: Diagnosis not present

## 2020-01-03 DIAGNOSIS — E7800 Pure hypercholesterolemia, unspecified: Secondary | ICD-10-CM | POA: Diagnosis not present

## 2020-01-03 DIAGNOSIS — N189 Chronic kidney disease, unspecified: Secondary | ICD-10-CM | POA: Diagnosis not present

## 2020-01-03 DIAGNOSIS — G8929 Other chronic pain: Secondary | ICD-10-CM | POA: Diagnosis not present

## 2020-01-03 DIAGNOSIS — M8008XD Age-related osteoporosis with current pathological fracture, vertebra(e), subsequent encounter for fracture with routine healing: Secondary | ICD-10-CM | POA: Diagnosis not present

## 2020-01-03 DIAGNOSIS — M47816 Spondylosis without myelopathy or radiculopathy, lumbar region: Secondary | ICD-10-CM | POA: Diagnosis not present

## 2020-01-03 DIAGNOSIS — E039 Hypothyroidism, unspecified: Secondary | ICD-10-CM | POA: Diagnosis not present

## 2020-01-03 DIAGNOSIS — I129 Hypertensive chronic kidney disease with stage 1 through stage 4 chronic kidney disease, or unspecified chronic kidney disease: Secondary | ICD-10-CM | POA: Diagnosis not present

## 2020-01-03 DIAGNOSIS — I4891 Unspecified atrial fibrillation: Secondary | ICD-10-CM | POA: Diagnosis not present

## 2020-01-04 DIAGNOSIS — M47816 Spondylosis without myelopathy or radiculopathy, lumbar region: Secondary | ICD-10-CM | POA: Diagnosis not present

## 2020-01-04 DIAGNOSIS — N189 Chronic kidney disease, unspecified: Secondary | ICD-10-CM | POA: Diagnosis not present

## 2020-01-04 DIAGNOSIS — M8008XD Age-related osteoporosis with current pathological fracture, vertebra(e), subsequent encounter for fracture with routine healing: Secondary | ICD-10-CM | POA: Diagnosis not present

## 2020-01-04 DIAGNOSIS — E039 Hypothyroidism, unspecified: Secondary | ICD-10-CM | POA: Diagnosis not present

## 2020-01-04 DIAGNOSIS — E7800 Pure hypercholesterolemia, unspecified: Secondary | ICD-10-CM | POA: Diagnosis not present

## 2020-01-04 DIAGNOSIS — I129 Hypertensive chronic kidney disease with stage 1 through stage 4 chronic kidney disease, or unspecified chronic kidney disease: Secondary | ICD-10-CM | POA: Diagnosis not present

## 2020-01-04 DIAGNOSIS — G8929 Other chronic pain: Secondary | ICD-10-CM | POA: Diagnosis not present

## 2020-01-04 DIAGNOSIS — F329 Major depressive disorder, single episode, unspecified: Secondary | ICD-10-CM | POA: Diagnosis not present

## 2020-01-04 DIAGNOSIS — I4891 Unspecified atrial fibrillation: Secondary | ICD-10-CM | POA: Diagnosis not present

## 2020-01-08 DIAGNOSIS — N189 Chronic kidney disease, unspecified: Secondary | ICD-10-CM | POA: Diagnosis not present

## 2020-01-08 DIAGNOSIS — E039 Hypothyroidism, unspecified: Secondary | ICD-10-CM | POA: Diagnosis not present

## 2020-01-08 DIAGNOSIS — I4891 Unspecified atrial fibrillation: Secondary | ICD-10-CM | POA: Diagnosis not present

## 2020-01-08 DIAGNOSIS — I129 Hypertensive chronic kidney disease with stage 1 through stage 4 chronic kidney disease, or unspecified chronic kidney disease: Secondary | ICD-10-CM | POA: Diagnosis not present

## 2020-01-08 DIAGNOSIS — E7800 Pure hypercholesterolemia, unspecified: Secondary | ICD-10-CM | POA: Diagnosis not present

## 2020-01-08 DIAGNOSIS — M8008XD Age-related osteoporosis with current pathological fracture, vertebra(e), subsequent encounter for fracture with routine healing: Secondary | ICD-10-CM | POA: Diagnosis not present

## 2020-01-08 DIAGNOSIS — M47816 Spondylosis without myelopathy or radiculopathy, lumbar region: Secondary | ICD-10-CM | POA: Diagnosis not present

## 2020-01-08 DIAGNOSIS — F329 Major depressive disorder, single episode, unspecified: Secondary | ICD-10-CM | POA: Diagnosis not present

## 2020-01-08 DIAGNOSIS — G8929 Other chronic pain: Secondary | ICD-10-CM | POA: Diagnosis not present

## 2020-01-09 DIAGNOSIS — E039 Hypothyroidism, unspecified: Secondary | ICD-10-CM | POA: Diagnosis not present

## 2020-01-09 DIAGNOSIS — N189 Chronic kidney disease, unspecified: Secondary | ICD-10-CM | POA: Diagnosis not present

## 2020-01-09 DIAGNOSIS — G8929 Other chronic pain: Secondary | ICD-10-CM | POA: Diagnosis not present

## 2020-01-09 DIAGNOSIS — M47816 Spondylosis without myelopathy or radiculopathy, lumbar region: Secondary | ICD-10-CM | POA: Diagnosis not present

## 2020-01-09 DIAGNOSIS — I129 Hypertensive chronic kidney disease with stage 1 through stage 4 chronic kidney disease, or unspecified chronic kidney disease: Secondary | ICD-10-CM | POA: Diagnosis not present

## 2020-01-09 DIAGNOSIS — M8008XD Age-related osteoporosis with current pathological fracture, vertebra(e), subsequent encounter for fracture with routine healing: Secondary | ICD-10-CM | POA: Diagnosis not present

## 2020-01-09 DIAGNOSIS — F329 Major depressive disorder, single episode, unspecified: Secondary | ICD-10-CM | POA: Diagnosis not present

## 2020-01-09 DIAGNOSIS — E7800 Pure hypercholesterolemia, unspecified: Secondary | ICD-10-CM | POA: Diagnosis not present

## 2020-01-09 DIAGNOSIS — I4891 Unspecified atrial fibrillation: Secondary | ICD-10-CM | POA: Diagnosis not present

## 2020-01-12 DIAGNOSIS — N189 Chronic kidney disease, unspecified: Secondary | ICD-10-CM | POA: Diagnosis not present

## 2020-01-12 DIAGNOSIS — E039 Hypothyroidism, unspecified: Secondary | ICD-10-CM | POA: Diagnosis not present

## 2020-01-12 DIAGNOSIS — I129 Hypertensive chronic kidney disease with stage 1 through stage 4 chronic kidney disease, or unspecified chronic kidney disease: Secondary | ICD-10-CM | POA: Diagnosis not present

## 2020-01-12 DIAGNOSIS — G8929 Other chronic pain: Secondary | ICD-10-CM | POA: Diagnosis not present

## 2020-01-12 DIAGNOSIS — E7800 Pure hypercholesterolemia, unspecified: Secondary | ICD-10-CM | POA: Diagnosis not present

## 2020-01-12 DIAGNOSIS — F329 Major depressive disorder, single episode, unspecified: Secondary | ICD-10-CM | POA: Diagnosis not present

## 2020-01-12 DIAGNOSIS — M8008XD Age-related osteoporosis with current pathological fracture, vertebra(e), subsequent encounter for fracture with routine healing: Secondary | ICD-10-CM | POA: Diagnosis not present

## 2020-01-12 DIAGNOSIS — I4891 Unspecified atrial fibrillation: Secondary | ICD-10-CM | POA: Diagnosis not present

## 2020-01-12 DIAGNOSIS — M47816 Spondylosis without myelopathy or radiculopathy, lumbar region: Secondary | ICD-10-CM | POA: Diagnosis not present

## 2020-01-16 DIAGNOSIS — R8271 Bacteriuria: Secondary | ICD-10-CM | POA: Diagnosis not present

## 2020-01-16 DIAGNOSIS — R3912 Poor urinary stream: Secondary | ICD-10-CM | POA: Diagnosis not present

## 2020-01-16 DIAGNOSIS — R102 Pelvic and perineal pain: Secondary | ICD-10-CM | POA: Diagnosis not present

## 2020-01-18 DIAGNOSIS — F329 Major depressive disorder, single episode, unspecified: Secondary | ICD-10-CM | POA: Diagnosis not present

## 2020-01-18 DIAGNOSIS — M47816 Spondylosis without myelopathy or radiculopathy, lumbar region: Secondary | ICD-10-CM | POA: Diagnosis not present

## 2020-01-18 DIAGNOSIS — E039 Hypothyroidism, unspecified: Secondary | ICD-10-CM | POA: Diagnosis not present

## 2020-01-18 DIAGNOSIS — M8008XD Age-related osteoporosis with current pathological fracture, vertebra(e), subsequent encounter for fracture with routine healing: Secondary | ICD-10-CM | POA: Diagnosis not present

## 2020-01-18 DIAGNOSIS — E7800 Pure hypercholesterolemia, unspecified: Secondary | ICD-10-CM | POA: Diagnosis not present

## 2020-01-18 DIAGNOSIS — G8929 Other chronic pain: Secondary | ICD-10-CM | POA: Diagnosis not present

## 2020-01-18 DIAGNOSIS — I129 Hypertensive chronic kidney disease with stage 1 through stage 4 chronic kidney disease, or unspecified chronic kidney disease: Secondary | ICD-10-CM | POA: Diagnosis not present

## 2020-01-18 DIAGNOSIS — N189 Chronic kidney disease, unspecified: Secondary | ICD-10-CM | POA: Diagnosis not present

## 2020-01-18 DIAGNOSIS — I4891 Unspecified atrial fibrillation: Secondary | ICD-10-CM | POA: Diagnosis not present

## 2020-01-19 DIAGNOSIS — N189 Chronic kidney disease, unspecified: Secondary | ICD-10-CM | POA: Diagnosis not present

## 2020-01-19 DIAGNOSIS — F329 Major depressive disorder, single episode, unspecified: Secondary | ICD-10-CM | POA: Diagnosis not present

## 2020-01-19 DIAGNOSIS — M8008XD Age-related osteoporosis with current pathological fracture, vertebra(e), subsequent encounter for fracture with routine healing: Secondary | ICD-10-CM | POA: Diagnosis not present

## 2020-01-19 DIAGNOSIS — E039 Hypothyroidism, unspecified: Secondary | ICD-10-CM | POA: Diagnosis not present

## 2020-01-19 DIAGNOSIS — I129 Hypertensive chronic kidney disease with stage 1 through stage 4 chronic kidney disease, or unspecified chronic kidney disease: Secondary | ICD-10-CM | POA: Diagnosis not present

## 2020-01-19 DIAGNOSIS — E7800 Pure hypercholesterolemia, unspecified: Secondary | ICD-10-CM | POA: Diagnosis not present

## 2020-01-19 DIAGNOSIS — M47816 Spondylosis without myelopathy or radiculopathy, lumbar region: Secondary | ICD-10-CM | POA: Diagnosis not present

## 2020-01-19 DIAGNOSIS — I4891 Unspecified atrial fibrillation: Secondary | ICD-10-CM | POA: Diagnosis not present

## 2020-01-19 DIAGNOSIS — G8929 Other chronic pain: Secondary | ICD-10-CM | POA: Diagnosis not present

## 2020-01-24 DIAGNOSIS — M8008XD Age-related osteoporosis with current pathological fracture, vertebra(e), subsequent encounter for fracture with routine healing: Secondary | ICD-10-CM | POA: Diagnosis not present

## 2020-01-24 DIAGNOSIS — N189 Chronic kidney disease, unspecified: Secondary | ICD-10-CM | POA: Diagnosis not present

## 2020-01-24 DIAGNOSIS — E039 Hypothyroidism, unspecified: Secondary | ICD-10-CM | POA: Diagnosis not present

## 2020-01-24 DIAGNOSIS — I129 Hypertensive chronic kidney disease with stage 1 through stage 4 chronic kidney disease, or unspecified chronic kidney disease: Secondary | ICD-10-CM | POA: Diagnosis not present

## 2020-01-24 DIAGNOSIS — F329 Major depressive disorder, single episode, unspecified: Secondary | ICD-10-CM | POA: Diagnosis not present

## 2020-01-24 DIAGNOSIS — I4891 Unspecified atrial fibrillation: Secondary | ICD-10-CM | POA: Diagnosis not present

## 2020-01-24 DIAGNOSIS — E7800 Pure hypercholesterolemia, unspecified: Secondary | ICD-10-CM | POA: Diagnosis not present

## 2020-01-24 DIAGNOSIS — G8929 Other chronic pain: Secondary | ICD-10-CM | POA: Diagnosis not present

## 2020-01-24 DIAGNOSIS — M47816 Spondylosis without myelopathy or radiculopathy, lumbar region: Secondary | ICD-10-CM | POA: Diagnosis not present

## 2020-01-30 DIAGNOSIS — I10 Essential (primary) hypertension: Secondary | ICD-10-CM | POA: Diagnosis not present

## 2020-01-30 DIAGNOSIS — R5383 Other fatigue: Secondary | ICD-10-CM | POA: Diagnosis not present

## 2020-01-30 DIAGNOSIS — R1084 Generalized abdominal pain: Secondary | ICD-10-CM | POA: Diagnosis not present

## 2020-02-01 DIAGNOSIS — F329 Major depressive disorder, single episode, unspecified: Secondary | ICD-10-CM | POA: Diagnosis not present

## 2020-02-01 DIAGNOSIS — I4891 Unspecified atrial fibrillation: Secondary | ICD-10-CM | POA: Diagnosis not present

## 2020-02-01 DIAGNOSIS — M47816 Spondylosis without myelopathy or radiculopathy, lumbar region: Secondary | ICD-10-CM | POA: Diagnosis not present

## 2020-02-01 DIAGNOSIS — N189 Chronic kidney disease, unspecified: Secondary | ICD-10-CM | POA: Diagnosis not present

## 2020-02-01 DIAGNOSIS — E039 Hypothyroidism, unspecified: Secondary | ICD-10-CM | POA: Diagnosis not present

## 2020-02-01 DIAGNOSIS — G8929 Other chronic pain: Secondary | ICD-10-CM | POA: Diagnosis not present

## 2020-02-01 DIAGNOSIS — E7800 Pure hypercholesterolemia, unspecified: Secondary | ICD-10-CM | POA: Diagnosis not present

## 2020-02-01 DIAGNOSIS — I129 Hypertensive chronic kidney disease with stage 1 through stage 4 chronic kidney disease, or unspecified chronic kidney disease: Secondary | ICD-10-CM | POA: Diagnosis not present

## 2020-02-01 DIAGNOSIS — M8008XD Age-related osteoporosis with current pathological fracture, vertebra(e), subsequent encounter for fracture with routine healing: Secondary | ICD-10-CM | POA: Diagnosis not present

## 2020-02-06 DIAGNOSIS — E876 Hypokalemia: Secondary | ICD-10-CM | POA: Diagnosis not present

## 2020-02-07 DIAGNOSIS — M8008XD Age-related osteoporosis with current pathological fracture, vertebra(e), subsequent encounter for fracture with routine healing: Secondary | ICD-10-CM | POA: Diagnosis not present

## 2020-02-07 DIAGNOSIS — G8929 Other chronic pain: Secondary | ICD-10-CM | POA: Diagnosis not present

## 2020-02-07 DIAGNOSIS — I129 Hypertensive chronic kidney disease with stage 1 through stage 4 chronic kidney disease, or unspecified chronic kidney disease: Secondary | ICD-10-CM | POA: Diagnosis not present

## 2020-02-07 DIAGNOSIS — I4891 Unspecified atrial fibrillation: Secondary | ICD-10-CM | POA: Diagnosis not present

## 2020-02-07 DIAGNOSIS — F329 Major depressive disorder, single episode, unspecified: Secondary | ICD-10-CM | POA: Diagnosis not present

## 2020-02-07 DIAGNOSIS — N189 Chronic kidney disease, unspecified: Secondary | ICD-10-CM | POA: Diagnosis not present

## 2020-02-07 DIAGNOSIS — E039 Hypothyroidism, unspecified: Secondary | ICD-10-CM | POA: Diagnosis not present

## 2020-02-07 DIAGNOSIS — M47816 Spondylosis without myelopathy or radiculopathy, lumbar region: Secondary | ICD-10-CM | POA: Diagnosis not present

## 2020-02-07 DIAGNOSIS — E7800 Pure hypercholesterolemia, unspecified: Secondary | ICD-10-CM | POA: Diagnosis not present

## 2020-02-08 DIAGNOSIS — M6281 Muscle weakness (generalized): Secondary | ICD-10-CM | POA: Diagnosis not present

## 2020-02-08 DIAGNOSIS — R102 Pelvic and perineal pain: Secondary | ICD-10-CM | POA: Diagnosis not present

## 2020-02-08 DIAGNOSIS — R351 Nocturia: Secondary | ICD-10-CM | POA: Diagnosis not present

## 2020-02-08 DIAGNOSIS — N3942 Incontinence without sensory awareness: Secondary | ICD-10-CM | POA: Diagnosis not present

## 2020-02-22 DIAGNOSIS — M6281 Muscle weakness (generalized): Secondary | ICD-10-CM | POA: Diagnosis not present

## 2020-02-22 DIAGNOSIS — R102 Pelvic and perineal pain: Secondary | ICD-10-CM | POA: Diagnosis not present

## 2020-02-22 DIAGNOSIS — N3942 Incontinence without sensory awareness: Secondary | ICD-10-CM | POA: Diagnosis not present

## 2020-02-27 DIAGNOSIS — R350 Frequency of micturition: Secondary | ICD-10-CM | POA: Diagnosis not present

## 2020-02-27 DIAGNOSIS — N3942 Incontinence without sensory awareness: Secondary | ICD-10-CM | POA: Diagnosis not present

## 2020-03-06 DIAGNOSIS — I10 Essential (primary) hypertension: Secondary | ICD-10-CM | POA: Diagnosis not present

## 2020-03-06 DIAGNOSIS — Z7901 Long term (current) use of anticoagulants: Secondary | ICD-10-CM | POA: Diagnosis not present

## 2020-03-06 DIAGNOSIS — N390 Urinary tract infection, site not specified: Secondary | ICD-10-CM | POA: Diagnosis not present

## 2020-03-12 DIAGNOSIS — E876 Hypokalemia: Secondary | ICD-10-CM | POA: Diagnosis not present

## 2020-03-12 DIAGNOSIS — E782 Mixed hyperlipidemia: Secondary | ICD-10-CM | POA: Diagnosis not present

## 2020-03-12 DIAGNOSIS — Z0000 Encounter for general adult medical examination without abnormal findings: Secondary | ICD-10-CM | POA: Diagnosis not present

## 2020-03-12 DIAGNOSIS — I10 Essential (primary) hypertension: Secondary | ICD-10-CM | POA: Diagnosis not present

## 2020-03-12 DIAGNOSIS — R7303 Prediabetes: Secondary | ICD-10-CM | POA: Diagnosis not present

## 2020-03-12 DIAGNOSIS — E039 Hypothyroidism, unspecified: Secondary | ICD-10-CM | POA: Diagnosis not present

## 2020-03-12 DIAGNOSIS — Z7901 Long term (current) use of anticoagulants: Secondary | ICD-10-CM | POA: Diagnosis not present

## 2020-03-12 DIAGNOSIS — J309 Allergic rhinitis, unspecified: Secondary | ICD-10-CM | POA: Diagnosis not present

## 2020-03-12 DIAGNOSIS — I4891 Unspecified atrial fibrillation: Secondary | ICD-10-CM | POA: Diagnosis not present

## 2020-03-14 DIAGNOSIS — Z6834 Body mass index (BMI) 34.0-34.9, adult: Secondary | ICD-10-CM | POA: Diagnosis not present

## 2020-03-14 DIAGNOSIS — R7303 Prediabetes: Secondary | ICD-10-CM | POA: Diagnosis not present

## 2020-03-14 DIAGNOSIS — E669 Obesity, unspecified: Secondary | ICD-10-CM | POA: Diagnosis not present

## 2020-03-28 DIAGNOSIS — I10 Essential (primary) hypertension: Secondary | ICD-10-CM | POA: Diagnosis not present

## 2020-03-28 DIAGNOSIS — E876 Hypokalemia: Secondary | ICD-10-CM | POA: Diagnosis not present

## 2020-03-28 DIAGNOSIS — I4891 Unspecified atrial fibrillation: Secondary | ICD-10-CM | POA: Diagnosis not present

## 2020-03-28 DIAGNOSIS — Z8673 Personal history of transient ischemic attack (TIA), and cerebral infarction without residual deficits: Secondary | ICD-10-CM | POA: Diagnosis not present

## 2020-03-28 DIAGNOSIS — R7302 Impaired glucose tolerance (oral): Secondary | ICD-10-CM | POA: Diagnosis not present

## 2020-03-28 DIAGNOSIS — R7303 Prediabetes: Secondary | ICD-10-CM | POA: Diagnosis not present

## 2020-03-28 DIAGNOSIS — Z7901 Long term (current) use of anticoagulants: Secondary | ICD-10-CM | POA: Diagnosis not present

## 2020-04-19 DIAGNOSIS — R102 Pelvic and perineal pain: Secondary | ICD-10-CM | POA: Diagnosis not present

## 2020-04-19 DIAGNOSIS — N3020 Other chronic cystitis without hematuria: Secondary | ICD-10-CM | POA: Diagnosis not present

## 2020-04-24 ENCOUNTER — Other Ambulatory Visit: Payer: Self-pay | Admitting: Gastroenterology

## 2020-04-24 ENCOUNTER — Other Ambulatory Visit: Payer: Self-pay | Admitting: Internal Medicine

## 2020-04-24 DIAGNOSIS — K5900 Constipation, unspecified: Secondary | ICD-10-CM | POA: Diagnosis not present

## 2020-04-24 DIAGNOSIS — N3020 Other chronic cystitis without hematuria: Secondary | ICD-10-CM | POA: Diagnosis not present

## 2020-04-24 DIAGNOSIS — R109 Unspecified abdominal pain: Secondary | ICD-10-CM | POA: Diagnosis not present

## 2020-04-24 DIAGNOSIS — R350 Frequency of micturition: Secondary | ICD-10-CM | POA: Diagnosis not present

## 2020-04-24 DIAGNOSIS — R351 Nocturia: Secondary | ICD-10-CM | POA: Diagnosis not present

## 2020-05-06 DIAGNOSIS — H2513 Age-related nuclear cataract, bilateral: Secondary | ICD-10-CM | POA: Diagnosis not present

## 2020-05-06 DIAGNOSIS — H25013 Cortical age-related cataract, bilateral: Secondary | ICD-10-CM | POA: Diagnosis not present

## 2020-05-06 DIAGNOSIS — H353131 Nonexudative age-related macular degeneration, bilateral, early dry stage: Secondary | ICD-10-CM | POA: Diagnosis not present

## 2020-05-06 DIAGNOSIS — H43813 Vitreous degeneration, bilateral: Secondary | ICD-10-CM | POA: Diagnosis not present

## 2020-05-09 ENCOUNTER — Ambulatory Visit
Admission: RE | Admit: 2020-05-09 | Discharge: 2020-05-09 | Disposition: A | Discharge status: Home / Self Care | Payer: Medicare PPO | Source: Ambulatory Visit | Attending: Gastroenterology | Admitting: Gastroenterology

## 2020-05-09 ENCOUNTER — Other Ambulatory Visit: Payer: Self-pay | Admitting: Gastroenterology

## 2020-05-09 DIAGNOSIS — I700 Atherosclerosis of aorta: Secondary | ICD-10-CM | POA: Diagnosis not present

## 2020-05-09 DIAGNOSIS — R109 Unspecified abdominal pain: Secondary | ICD-10-CM

## 2020-05-09 DIAGNOSIS — R1030 Lower abdominal pain, unspecified: Secondary | ICD-10-CM | POA: Diagnosis not present

## 2020-05-09 DIAGNOSIS — K449 Diaphragmatic hernia without obstruction or gangrene: Secondary | ICD-10-CM | POA: Diagnosis not present

## 2020-05-09 DIAGNOSIS — R110 Nausea: Secondary | ICD-10-CM | POA: Diagnosis not present

## 2020-05-09 MED ORDER — IOPAMIDOL (ISOVUE-300) INJECTION 61%: 100.0000 mL | Freq: Once | INTRAVENOUS | Status: DC | PRN

## 2020-05-20 DIAGNOSIS — K449 Diaphragmatic hernia without obstruction or gangrene: Secondary | ICD-10-CM | POA: Diagnosis not present

## 2020-05-20 DIAGNOSIS — K5900 Constipation, unspecified: Secondary | ICD-10-CM | POA: Diagnosis not present

## 2020-05-20 DIAGNOSIS — K219 Gastro-esophageal reflux disease without esophagitis: Secondary | ICD-10-CM | POA: Diagnosis not present

## 2020-06-05 DIAGNOSIS — H35033 Hypertensive retinopathy, bilateral: Secondary | ICD-10-CM | POA: Diagnosis not present

## 2020-06-05 DIAGNOSIS — H25013 Cortical age-related cataract, bilateral: Secondary | ICD-10-CM | POA: Diagnosis not present

## 2020-06-05 DIAGNOSIS — H353111 Nonexudative age-related macular degeneration, right eye, early dry stage: Secondary | ICD-10-CM | POA: Diagnosis not present

## 2020-06-05 DIAGNOSIS — H52213 Irregular astigmatism, bilateral: Secondary | ICD-10-CM | POA: Diagnosis not present

## 2020-06-05 DIAGNOSIS — H2513 Age-related nuclear cataract, bilateral: Secondary | ICD-10-CM | POA: Diagnosis not present

## 2020-06-05 DIAGNOSIS — H2512 Age-related nuclear cataract, left eye: Secondary | ICD-10-CM | POA: Diagnosis not present

## 2020-06-27 DIAGNOSIS — Z7901 Long term (current) use of anticoagulants: Secondary | ICD-10-CM | POA: Diagnosis not present

## 2020-06-27 DIAGNOSIS — R7302 Impaired glucose tolerance (oral): Secondary | ICD-10-CM | POA: Diagnosis not present

## 2020-06-27 DIAGNOSIS — Z8673 Personal history of transient ischemic attack (TIA), and cerebral infarction without residual deficits: Secondary | ICD-10-CM | POA: Diagnosis not present

## 2020-06-27 DIAGNOSIS — Z23 Encounter for immunization: Secondary | ICD-10-CM | POA: Diagnosis not present

## 2020-06-27 DIAGNOSIS — E039 Hypothyroidism, unspecified: Secondary | ICD-10-CM | POA: Diagnosis not present

## 2020-06-27 DIAGNOSIS — R7303 Prediabetes: Secondary | ICD-10-CM | POA: Diagnosis not present

## 2020-06-27 DIAGNOSIS — I10 Essential (primary) hypertension: Secondary | ICD-10-CM | POA: Diagnosis not present

## 2020-06-27 DIAGNOSIS — I4891 Unspecified atrial fibrillation: Secondary | ICD-10-CM | POA: Diagnosis not present

## 2020-06-27 DIAGNOSIS — E876 Hypokalemia: Secondary | ICD-10-CM | POA: Diagnosis not present

## 2020-07-02 DIAGNOSIS — H25812 Combined forms of age-related cataract, left eye: Secondary | ICD-10-CM | POA: Diagnosis not present

## 2020-07-02 DIAGNOSIS — H2512 Age-related nuclear cataract, left eye: Secondary | ICD-10-CM | POA: Diagnosis not present

## 2020-07-08 DIAGNOSIS — R509 Fever, unspecified: Secondary | ICD-10-CM | POA: Diagnosis not present

## 2020-07-08 DIAGNOSIS — Z20828 Contact with and (suspected) exposure to other viral communicable diseases: Secondary | ICD-10-CM | POA: Diagnosis not present

## 2020-07-08 DIAGNOSIS — J0100 Acute maxillary sinusitis, unspecified: Secondary | ICD-10-CM | POA: Diagnosis not present

## 2020-07-10 DIAGNOSIS — M810 Age-related osteoporosis without current pathological fracture: Secondary | ICD-10-CM | POA: Diagnosis not present

## 2020-07-24 DIAGNOSIS — H2511 Age-related nuclear cataract, right eye: Secondary | ICD-10-CM | POA: Diagnosis not present

## 2020-07-24 DIAGNOSIS — H25011 Cortical age-related cataract, right eye: Secondary | ICD-10-CM | POA: Diagnosis not present

## 2020-07-30 DIAGNOSIS — H2511 Age-related nuclear cataract, right eye: Secondary | ICD-10-CM | POA: Diagnosis not present

## 2020-07-30 DIAGNOSIS — H25811 Combined forms of age-related cataract, right eye: Secondary | ICD-10-CM | POA: Diagnosis not present

## 2020-07-30 DIAGNOSIS — H25011 Cortical age-related cataract, right eye: Secondary | ICD-10-CM | POA: Diagnosis not present

## 2020-09-10 DIAGNOSIS — E876 Hypokalemia: Secondary | ICD-10-CM | POA: Diagnosis not present

## 2020-09-10 DIAGNOSIS — E782 Mixed hyperlipidemia: Secondary | ICD-10-CM | POA: Diagnosis not present

## 2020-09-10 DIAGNOSIS — Z961 Presence of intraocular lens: Secondary | ICD-10-CM | POA: Diagnosis not present

## 2020-09-10 DIAGNOSIS — R7303 Prediabetes: Secondary | ICD-10-CM | POA: Diagnosis not present

## 2020-09-12 DIAGNOSIS — R140 Abdominal distension (gaseous): Secondary | ICD-10-CM | POA: Diagnosis not present

## 2020-09-12 DIAGNOSIS — D6859 Other primary thrombophilia: Secondary | ICD-10-CM | POA: Diagnosis not present

## 2020-09-12 DIAGNOSIS — I4891 Unspecified atrial fibrillation: Secondary | ICD-10-CM | POA: Diagnosis not present

## 2020-09-12 DIAGNOSIS — Z8673 Personal history of transient ischemic attack (TIA), and cerebral infarction without residual deficits: Secondary | ICD-10-CM | POA: Diagnosis not present

## 2020-09-12 DIAGNOSIS — R195 Other fecal abnormalities: Secondary | ICD-10-CM | POA: Diagnosis not present

## 2020-09-12 DIAGNOSIS — R7303 Prediabetes: Secondary | ICD-10-CM | POA: Diagnosis not present

## 2020-12-19 DIAGNOSIS — K449 Diaphragmatic hernia without obstruction or gangrene: Secondary | ICD-10-CM | POA: Diagnosis not present

## 2020-12-19 DIAGNOSIS — K5900 Constipation, unspecified: Secondary | ICD-10-CM | POA: Diagnosis not present

## 2020-12-19 DIAGNOSIS — K219 Gastro-esophageal reflux disease without esophagitis: Secondary | ICD-10-CM | POA: Diagnosis not present

## 2020-12-26 DIAGNOSIS — R351 Nocturia: Secondary | ICD-10-CM | POA: Diagnosis not present

## 2020-12-26 DIAGNOSIS — N3020 Other chronic cystitis without hematuria: Secondary | ICD-10-CM | POA: Diagnosis not present

## 2021-01-31 DIAGNOSIS — M84471A Pathological fracture, right ankle, initial encounter for fracture: Secondary | ICD-10-CM | POA: Diagnosis not present

## 2021-02-12 DIAGNOSIS — M810 Age-related osteoporosis without current pathological fracture: Secondary | ICD-10-CM | POA: Diagnosis not present

## 2021-02-27 DIAGNOSIS — H353132 Nonexudative age-related macular degeneration, bilateral, intermediate dry stage: Secondary | ICD-10-CM | POA: Diagnosis not present

## 2021-02-27 DIAGNOSIS — H26492 Other secondary cataract, left eye: Secondary | ICD-10-CM | POA: Diagnosis not present

## 2021-02-27 DIAGNOSIS — H35033 Hypertensive retinopathy, bilateral: Secondary | ICD-10-CM | POA: Diagnosis not present

## 2021-02-27 DIAGNOSIS — H1045 Other chronic allergic conjunctivitis: Secondary | ICD-10-CM | POA: Diagnosis not present

## 2021-02-27 DIAGNOSIS — H524 Presbyopia: Secondary | ICD-10-CM | POA: Diagnosis not present

## 2021-03-13 DIAGNOSIS — I10 Essential (primary) hypertension: Secondary | ICD-10-CM | POA: Diagnosis not present

## 2021-03-13 DIAGNOSIS — E039 Hypothyroidism, unspecified: Secondary | ICD-10-CM | POA: Diagnosis not present

## 2021-03-18 DIAGNOSIS — Z0000 Encounter for general adult medical examination without abnormal findings: Secondary | ICD-10-CM | POA: Diagnosis not present

## 2021-03-18 DIAGNOSIS — D649 Anemia, unspecified: Secondary | ICD-10-CM | POA: Diagnosis not present

## 2021-03-18 DIAGNOSIS — E039 Hypothyroidism, unspecified: Secondary | ICD-10-CM | POA: Diagnosis not present

## 2021-03-18 DIAGNOSIS — R7303 Prediabetes: Secondary | ICD-10-CM | POA: Diagnosis not present

## 2021-03-18 DIAGNOSIS — I10 Essential (primary) hypertension: Secondary | ICD-10-CM | POA: Diagnosis not present

## 2021-03-18 DIAGNOSIS — I4891 Unspecified atrial fibrillation: Secondary | ICD-10-CM | POA: Diagnosis not present

## 2021-03-18 DIAGNOSIS — M791 Myalgia, unspecified site: Secondary | ICD-10-CM | POA: Diagnosis not present

## 2021-03-18 DIAGNOSIS — F5101 Primary insomnia: Secondary | ICD-10-CM | POA: Diagnosis not present

## 2021-03-18 DIAGNOSIS — J309 Allergic rhinitis, unspecified: Secondary | ICD-10-CM | POA: Diagnosis not present

## 2021-04-08 DIAGNOSIS — D649 Anemia, unspecified: Secondary | ICD-10-CM | POA: Diagnosis not present

## 2021-04-18 ENCOUNTER — Telehealth: Payer: Self-pay

## 2021-04-18 DIAGNOSIS — D649 Anemia, unspecified: Secondary | ICD-10-CM | POA: Diagnosis not present

## 2021-04-18 DIAGNOSIS — R110 Nausea: Secondary | ICD-10-CM | POA: Diagnosis not present

## 2021-04-18 DIAGNOSIS — R195 Other fecal abnormalities: Secondary | ICD-10-CM | POA: Diagnosis not present

## 2021-04-18 NOTE — Telephone Encounter (Signed)
   [facility] Pre-operative Risk Assessment    Patient Name: SURENA WELGE  DOB: 08/04/1936 MRN: [id number]  HEARTCARE STAFF:  - IMPORTANT!!!!!! Under Visit Info/Reason for Call, type in Other and utilize the format Clearance MM/DD/YY or Clearance TBD. Do not use dashes or single digits. - Please review there is not already an duplicate clearance open for this procedure. - If request is for dental extraction, please clarify the # of teeth to be extracted. - If the patient is currently at the dentist's office, call Pre-Op Callback Staff (MA/nurse) to input urgent request.  - If the patient is not currently in the dentist office, please route to the Pre-Op pool.  Request for surgical clearance:  What type of surgery is being performed? Colonoscopy/endoscopy  When is this surgery scheduled? 04/30/21   What type of clearance is required (medical clearance vs. Pharmacy clearance to hold med vs. Both)? Both   Are there any medications that need to be held prior to surgery and how long? Warfarin is on medication list. Clearance request is asking to hold Xarelto for 5 days prior   Practice name and name of physician performing surgery? [facility], Dr. Alessandra Bevels  What is the office phone number? [phone]    7.   What is the office fax number? [phone]   8.   Anesthesia type (None, local, MAC, general) ? Propofol   ASAP is written on the top of the clearance request.   Jacqulynn Cadet 04/18/2021, 9:34 AM  _________________________________________________________________   (provider comments below)

## 2021-04-18 NOTE — Telephone Encounter (Signed)
Patient with diagnosis of A Fib on warfarin for anticoagulation.    Procedure: colonoscopy/endoscopy Date of procedure: 04/30/21   CHA2DS2-VASc Score = 7  This indicates a 11.2% annual risk of stroke. The patient's score is based upon: CHF History: No HTN History: Yes Diabetes History: No Stroke History: Yes Vascular Disease History: Yes Age Score: 2 Gender Score: 1   No current lab work since 2020.  Patient will need updated chemistry panel and CBC before she can be cleared for procedure

## 2021-04-18 NOTE — Telephone Encounter (Signed)
I called the pt to come in for lab work. Pt states to me she has not been on coumadin x 2 yrs, that PCP Dr. Renne Crigler switched her over to Xarelto 20 mg once a day as supper time. I informed the pt that I will need to re-address with the pre op provider and Pharm-D. We will call her back if not today then on Monday 7/18.

## 2021-04-18 NOTE — Telephone Encounter (Signed)
Can you help coordinate labs needed for pharmacy clearance? Thanks!

## 2021-04-18 NOTE — Telephone Encounter (Signed)
Still will need labs.  Also has not seen provider since 02/2019 (telehealth)

## 2021-04-18 NOTE — Telephone Encounter (Signed)
I s/w the pt and she is agreeable to appt and lab work. Pt will see Tereso Newcomer, [facility] as Dr. [place] schedule is full. Pt is aware we will see her at our [facility] location, pt has been given address for [facility] [address]. Pt aware she will have pre op lab work that day as well. Pt thanked me for the call and the help. I will forward notes to [facility] for upcoming appt. Will send FYI to surgeon's office pt has appt 04/23/21

## 2021-04-22 ENCOUNTER — Other Ambulatory Visit: Payer: Self-pay | Admitting: Gastroenterology

## 2021-04-22 DIAGNOSIS — R748 Abnormal levels of other serum enzymes: Secondary | ICD-10-CM

## 2021-04-22 DIAGNOSIS — R110 Nausea: Secondary | ICD-10-CM

## 2021-04-22 NOTE — Progress Notes (Addendum)
Cardiology Office Note:    Date:  04/23/2021   ID:  THY GULLIKSON, DOB 1936-09-15, MRN [id number]  PCP:  Merri Brunette, MD   [facility] Providers Cardiologist:  Peter Swaziland, MD      Referring MD: Merri Brunette, MD   Chief Complaint:  Surgical Clearance    Patient Profile:    Vicki Morris is a 85 y.o. female with:  Paroxysmal atrial fibrillation Hypertension  Hyperlipidemia  Hypothyroidism  GERD Hx of TIA  Prior CV studies: GATED SPECT MYO PERF W/LEXISCAN STRESS 1D 09/30/2017 No ischemia, EF 51; low risk  Echocardiogram 09/21/17 EF 55-60, and no RWMA, GR 1 DD, AV sclerosis without aortic stenosis, trivial AI, trivial MR, moderate LAE, mild RAE  Carotid US 12/27/14 No carotid stenosis   History of Present Illness: Ms. Habermehl was last seen by Dr. Swaziland in 02/2019.  She returns for surgical clearance. She needs a colonoscopy/EGD on 04/30/21 with Dr. Levora Angel.  She is here today with her daughter. She notes that she has been anemic.  She thinks her Hgb has been ~ 11.  She has not had chest pain.  She has significant dyspnea on exertion.  She often has to stop when she walks in the store, etc.  She has not had syncope, orthopnea, significant leg edema.  She has not had any bleeding issues.  She notes that her hemoccult was positive prompting the EGD/colo.      Past Medical History:  Diagnosis Date   Allergic rhinitis, cause unspecified    Anxiety    Arthritis    Chronic anticoagulation    Depression    Dysrhythmia    afib    GERD (gastroesophageal reflux disease)    History of TIAs    Hyperlipidemia    Hypertension    Hypothyroidism    Normal nuclear stress test March 2013   EF 64%, clinically and electrically negative for ischemia. Myoview scan with apical thinning, could not exclude small subendocardial scar. No ischemia.    Obesity    PAF (paroxysmal atrial fibrillation) (HCC)    Peripheral vascular disease (HCC)    Pneumonia    hx of    Polyp of  nasal cavity    PONV (postoperative nausea and vomiting)    Stroke (HCC)    mini stroke  years ago    Urinary tract infection    hx of     Current Medications: Current Meds  Medication Sig   amitriptyline (ELAVIL) 10 MG tablet TK 1 T PO HS   betamethasone valerate (VALISONE) 0.1 % cream Apply 1 application topically every other day.   diltiazem (CARDIZEM CD) 240 MG 24 hr capsule Take 240 mg by mouth at bedtime.    diphenhydrAMINE (BENADRYL) 25 mg capsule Take 25 mg by mouth daily as needed for allergies.    EPINEPHrine 0.3 mg/0.3 mL IJ SOAJ injection Inject 0.3 mg into the muscle once.   LORazepam (ATIVAN) 0.5 MG tablet Take 0.5 mg by mouth at bedtime as needed for sleep.    olmesartan-hydrochlorothiazide (BENICAR HCT) 20-12.5 MG tablet Take 1 tablet by mouth daily.   potassium chloride (KLOR-CON) 20 MEQ packet Take 20 mEq by mouth every other day.    rivaroxaban (XARELTO) 20 MG TABS tablet Take 20 mg by mouth daily with supper.   rosuvastatin (CRESTOR) 10 MG tablet Take 10 mg by mouth at bedtime.    sertraline (ZOLOFT) 50 MG tablet Take 50 mg by mouth at bedtime.    SYNTHROID  100 MCG tablet Take 100 mcg by mouth daily before breakfast.    trimethoprim (TRIMPEX) 100 MG tablet Take 100 mg by mouth daily.      Allergies:   Other, Contrast media [iodinated diagnostic agents], Sulfonamide derivatives, Clarithromycin, and Nabumetone   Social History   Tobacco Use   Smoking status: Never   Smokeless tobacco: Never  Vaping Use   Vaping Use: Never used  Substance Use Topics   Alcohol use: No   Drug use: No     Family Hx: The patient's family history includes Breast cancer in her paternal aunt; Heart attack in her father; Heart disease in her father and mother.  Review of Systems  Gastrointestinal:  Negative for hematochezia.  Genitourinary:  Negative for hematuria.    EKGs/Labs/Other Test Reviewed:    EKG:  EKG is   ordered today.  The ekg ordered today demonstrates sinus  bradycardia, HR 58, leftward axis, nonspecific ST-T wave changes, QTC 431  Recent Labs: No results found for requested labs within last 8760 hours.   Recent Lipid Panel No results found for: CHOL, TRIG, HDL, LDLCALC, LDLDIRECT   Risk Assessment/Calculations:    CHA2DS2-VASc Score = 7  This indicates a 11.2% annual risk of stroke. The patient's score is based upon: CHF History: No HTN History: Yes Diabetes History: No Stroke History: Yes Vascular Disease History: Yes Age Score: 2 Gender Score: 1     Physical Exam:    VS:  BP 120/60   Pulse (!) 58   Ht 5\' 4"  (1.626 m)   Wt 182 lb 12.8 oz (82.9 kg)   SpO2 97%   BMI 31.38 kg/m     Wt Readings from Last 3 Encounters:  04/23/21 182 lb 12.8 oz (82.9 kg)  09/09/18 194 lb (88 kg)  08/22/18 199 lb (90.3 kg)     Constitutional:      Appearance: Healthy appearance. Not in distress.  Neck:     Vascular: JVD normal.  Pulmonary:     Effort: Pulmonary effort is normal.     Breath sounds: No wheezing. No rales.  Cardiovascular:     Normal rate. Regular rhythm. Normal S1. Normal S2.      Murmurs: There is no murmur.  Edema:    Peripheral edema absent.  Abdominal:     Palpations: Abdomen is soft.  Skin:    General: Skin is warm and dry.  Neurological:     Mental Status: Alert and oriented to person, place and time.     Cranial Nerves: Cranial nerves are intact.         ASSESSMENT & PLAN:    1. Preoperative cardiovascular examination 2. Shortness of breath    Ms. Gohman's perioperative risk of a major cardiac event is 0.9% according to the Revised Cardiac Risk Index (RCRI).  Therefore, she is at low risk for perioperative complications.   Her functional capacity is poor at 3.3 METs according to the Duke Activity Status Index (DASI).  She has significant dyspnea on exertion and I am concerned about an anginal equivalent.   Recommendations: The patient requires a nuclear stress test and an echocardiogram before a  disposition can be made regarding surgical risk.          Her echocardiogram will be done today (04/23/2021) Her Myoview will be done on Monday 04/28/21 Antiplatelet and/or Anticoagulation Recommendations: We need a recent Plt count to make recommendations for holding her Rivaroxaban.  She just had labs at [facility] last week.  We will request those and then make recommendations for when to start holding Rivaroxaban.  If we cannot get her labs before she leaves today after her echocardiogram, I will have her get a CBC, BMET here today.   3. Paroxysmal atrial fibrillation (HCC) Maintaining normal sinus rhythm.  She is tolerating anticoagulation.  She notes she is anemic and has +hemoccult prompting her EGD/colo. She remains on Rivaroxaban.  Continue Rivaroxaban.  Request labs from [facility] as noted.   4. Essential hypertension The patient's blood pressure is controlled on her current regimen.  Continue current therapy.      Shared Decision Making/Informed Consent The risks [chest pain, shortness of breath, cardiac arrhythmias, dizziness, blood pressure fluctuations, myocardial infarction, stroke/transient ischemic attack, nausea, vomiting, allergic reaction, radiation exposure, metallic taste sensation and life-threatening complications (estimated to be 1 in 10,000)], benefits (risk stratification, diagnosing coronary artery disease, treatment guidance) and alternatives of a nuclear stress test were discussed in detail with Ms. Bache and she agrees to proceed.    Dispo:  Return in about 6 months (around 10/24/2021) for Routine follow up in 6 months with Dr.Jordan. .   Medication Adjustments/Labs and Tests Ordered: Current medicines are reviewed at length with the patient today.  Concerns regarding medicines are outlined above.  Tests Ordered: Orders Placed This Encounter  Procedures   Myocardial Perfusion Imaging   EKG 12-Lead   ECHOCARDIOGRAM COMPLETE    Medication Changes: No orders of  the defined types were placed in this encounter.   Signed, Tereso Newcomer, PA-C  04/23/2021 12:38 PM    [facility] [address] [place], [place], [place]  [address] Phone: [phone]; Fax: [phone]   Addendum  Echocardiogram demonstrated normal ejection fraction and normal wall motion.  There was no significant valve disease. Nuclear stress test was low risk and negative for ischemia.   Therefore the patient can proceed wither her colonoscopy/EGD at acceptable risk.  She can hold Rivaroxaban (Xarelto) for 1 day prior to her procedure. This should be resumed as soon as possible after the procedure by her gastroenterologist when it is is felt to be safe.  Tereso Newcomer, PA-C    04/29/2021 11:13 AM

## 2021-04-23 ENCOUNTER — Other Ambulatory Visit: Payer: Self-pay

## 2021-04-23 ENCOUNTER — Ambulatory Visit (HOSPITAL_COMMUNITY): Payer: Medicare PPO | Attending: Cardiology

## 2021-04-23 ENCOUNTER — Encounter: Payer: Self-pay | Admitting: Physician Assistant

## 2021-04-23 ENCOUNTER — Telehealth: Payer: Self-pay | Admitting: *Deleted

## 2021-04-23 ENCOUNTER — Ambulatory Visit: Payer: Medicare PPO | Admitting: Physician Assistant

## 2021-04-23 VITALS — BP 120/60 | HR 58 | Ht 64.0 in | Wt 182.8 lb

## 2021-04-23 DIAGNOSIS — Z01810 Encounter for preprocedural cardiovascular examination: Secondary | ICD-10-CM

## 2021-04-23 DIAGNOSIS — I10 Essential (primary) hypertension: Secondary | ICD-10-CM

## 2021-04-23 DIAGNOSIS — I480 Paroxysmal atrial fibrillation: Secondary | ICD-10-CM | POA: Diagnosis not present

## 2021-04-23 DIAGNOSIS — R0602 Shortness of breath: Secondary | ICD-10-CM

## 2021-04-23 LAB — ECHOCARDIOGRAM COMPLETE
Area-P 1/2: 2.69 cm2
Height: 64 in
S' Lateral: 3.3 cm
Weight: 2924.8 oz

## 2021-04-23 NOTE — Patient Instructions (Signed)
Medication Instructions:   Your physician recommends that you continue on your current medications as directed. Please refer to the Current Medication list given to you today.  *If you need a refill on your cardiac medications before your next appointment, please call your pharmacy*   Lab Work:  MAYBE!!!!!  CBC If you have labs (blood work) drawn today and your tests are completely normal, you will receive your results only by: MyChart Message (if you have MyChart) OR A paper copy in the mail If you have any lab test that is abnormal or we need to change your treatment, we will call you to review the results.   Testing/Procedures: Your physician has requested that you have an echocardiogram. Echocardiography is a painless test that uses sound waves to create images of your heart. It provides your doctor with information about the size and shape of your heart and how well your heart's chambers and valves are working. This procedure takes approximately one hour. There are no restrictions for this procedure.  You are scheduled for a Myocardial Perfusion Imaging Study on Monday, July 25 at 10:00 am.   Please arrive 15 minutes prior to your appointment time for registration and insurance purposes.   The test will take approximately 3 to 4 hours to complete; you may bring reading material. If someone comes with you to your appointment, they will need to remain in the main lobby due to limited space in the testing area.   How to prepare for your Myocardial Perfusion test:   Do not eat or drink 3 hours prior to your test, except you may have water.    Do not consume products containing caffeine (regular or decaffeinated) 12 hours prior to your test (ex: coffee, chocolate, soda, tea)   Do bring a list of your current medications with you. If not listed below, you may take your medications as normal.   Bring any held medication to your appointment, as you may be required to take it once the  test is complete.   Do wear comfortable clothes (no dresses or overalls) and walking shoes. Tennis shoes are preferred. No heels or open toed shoes.  Do not wear cologne, perfume,  or lotions (deodorant is allowed).   If these instructions are not followed, you test will have to be rescheduled.   Please report to [address] for your test. If you have questions or concerns about your appointment, please call the Nuclear Lab at #[phone].  If you cannot keep your appointment, please provide 24 hour notification to the Nuclear lab to avoid a possible $50 charge to your account.     Follow-Up: At [facility], you and your health needs are our priority.  As part of our continuing mission to provide you with exceptional heart care, we have created designated Provider Care Teams.  These Care Teams include your primary Cardiologist (physician) and Advanced Practice Providers (APPs -  Physician Assistants and Nurse Practitioners) who all work together to provide you with the care you need, when you need it.  We recommend signing up for the patient portal called "MyChart".  Sign up information is provided on this After Visit Summary.  MyChart is used to connect with patients for Virtual Visits (Telemedicine).  Patients are able to view lab/test results, encounter notes, upcoming appointments, etc.  Non-urgent messages can be sent to your provider as well.   To learn more about what you can do with MyChart, go to [email or web address].  Your next appointment:   6 month(s)  The format for your next appointment:   In Person  Provider:   [facility] with Dr. Thomasene Lot    Other Instructions Your physician wants you to follow-up in: 6 months with Dr. Thomasene Lot.  You will receive a reminder letter in the mail two months in advance. If you don't receive a letter, please call our office to schedule the follow-up appointment.

## 2021-04-23 NOTE — Telephone Encounter (Signed)
S/w pt is aware of recommendations for upcoming surgery last dose of xarelto is July 25.

## 2021-04-23 NOTE — Telephone Encounter (Signed)
Labs received from [facility]. 04/18/2021: Creatinine 0.94, K+ 4.3, albumin 4.2, ALT 53, Hgb 12.2, platelets 284.  Will route to PharmD for rec's re: holding anticoagulation. Tereso Newcomer, PA-C    04/23/2021 2:58 PM

## 2021-04-23 NOTE — Telephone Encounter (Signed)
S/w Ladonna Snide at [facility] @ [phone] to receive pt's recent labs that are needed for pre-op clearance coming up next week.  Ladonna Snide will fax labs to [phone].

## 2021-04-23 NOTE — Telephone Encounter (Addendum)
   Name: Vicki Morris  DOB: [birth year]  MRN: [id number]   Primary Cardiologist: Peter Swaziland, MD  Chart reviewed as part of pre-operative protocol coverage. Patient was contacted 04/23/2021 in reference to pre-operative risk assessment for pending surgery as outlined below.  Vicki Morris was last seen on 04/23/21 by Tereso Newcomer, PA-C.  At that time, risk of MACE was estimated 0.9%, placing her at low risk for preoperative complications.  Functional capacity 3.3 METS.  Due to dyspnea on exertion, nuclear stress test and echo were recommended.  Labs received from [facility] and routed to pharmacy. Per pharmacy, recommendation is to hold Xarelto 1 day prior to procedure.  Removing from pool as contact received from [facility] [place], that he is able to complete clearance after testing and route to the surgeon.   Lennon Alstrom, PA-C 04/23/2021, 6:08 PM

## 2021-04-23 NOTE — Telephone Encounter (Signed)
Received fax from [facility] with cmet/cbc.

## 2021-04-23 NOTE — Telephone Encounter (Signed)
CrCl is 79mL/min using adjusted body weight due to obesity, plt stable at 284K. She is on correct dose of Xarelto 20mg  daily since this dosing is based on actual body weight for CrCl which is 46mL/min  CHADSVASc score calculated below, elevated at 7 including prior history of CVA.   Unsure why clearance is requesting 5 day Xarelto hold as this is excessive for DOAC. Recommend that pt hold Xarelto for 1 day prior to procedure due to elevated cardiac risk and resume as soon as safely possible. If > 1 day hold is required by physician performing procedure, will need cardiologist clearance.

## 2021-04-23 NOTE — Telephone Encounter (Signed)
Please let pt know that she is to hold her Xarelto for 1 day prior to her procedure.   Her last dose will be on July 25.  Her procedure is on July 27.  She will resume Xarelto after her colonoscopy as soon as the GI doctor says it is safe to do so (hopefully it will be that evening of July 27). Tereso Newcomer, PA-C    04/23/2021 4:41 PM

## 2021-04-24 ENCOUNTER — Encounter: Payer: Self-pay | Admitting: Physician Assistant

## 2021-04-24 NOTE — Addendum Note (Signed)
Addended byAlben Spittle, Lorin Picket T on: 04/24/2021 03:24 PM   Modules accepted: Orders

## 2021-04-25 ENCOUNTER — Telehealth (HOSPITAL_COMMUNITY): Payer: Self-pay | Admitting: *Deleted

## 2021-04-25 ENCOUNTER — Other Ambulatory Visit: Payer: Self-pay | Admitting: *Deleted

## 2021-04-25 DIAGNOSIS — Z01810 Encounter for preprocedural cardiovascular examination: Secondary | ICD-10-CM

## 2021-04-25 NOTE — Telephone Encounter (Signed)

## 2021-04-28 ENCOUNTER — Other Ambulatory Visit: Payer: Self-pay

## 2021-04-28 ENCOUNTER — Ambulatory Visit (HOSPITAL_COMMUNITY): Payer: Medicare PPO | Attending: Internal Medicine

## 2021-04-28 ENCOUNTER — Ambulatory Visit (HOSPITAL_BASED_OUTPATIENT_CLINIC_OR_DEPARTMENT_OTHER): Payer: Medicare PPO

## 2021-04-28 DIAGNOSIS — I480 Paroxysmal atrial fibrillation: Secondary | ICD-10-CM | POA: Diagnosis not present

## 2021-04-28 DIAGNOSIS — I10 Essential (primary) hypertension: Secondary | ICD-10-CM | POA: Diagnosis not present

## 2021-04-28 DIAGNOSIS — Z01810 Encounter for preprocedural cardiovascular examination: Secondary | ICD-10-CM

## 2021-04-28 LAB — ECHOCARDIOGRAM LIMITED
Area-P 1/2: 2.55 cm2
Height: 64 in
S' Lateral: 3.7 cm
Weight: 2912 oz

## 2021-04-28 LAB — MYOCARDIAL PERFUSION IMAGING
LV dias vol: 69 mL (ref 46–106)
LV sys vol: 29 mL
Peak HR: 79 {beats}/min
Rest HR: 55 {beats}/min
SDS: 5
SRS: 2
SSS: 7
TID: 1.18

## 2021-04-28 MED ORDER — TECHNETIUM TC 99M TETROFOSMIN IV KIT
10.2000 | PACK | Freq: Once | INTRAVENOUS | Status: AC | PRN
  Administered 2021-04-28: 10.2 via INTRAVENOUS
  Filled 2021-04-28: qty 11

## 2021-04-28 MED ORDER — TECHNETIUM TC 99M TETROFOSMIN IV KIT
31.3000 | PACK | Freq: Once | INTRAVENOUS | Status: AC | PRN
  Administered 2021-04-28: 31.3 via INTRAVENOUS
  Filled 2021-04-28: qty 32

## 2021-04-28 MED ORDER — PERFLUTREN LIPID MICROSPHERE
2.0000 mL | INTRAVENOUS | Status: AC | PRN
  Administered 2021-04-28: 2 mL via INTRAVENOUS

## 2021-04-28 MED ORDER — REGADENOSON 0.4 MG/5ML IV SOLN
0.4000 mg | Freq: Once | INTRAVENOUS | Status: AC
  Administered 2021-04-28: 0.4 mg via INTRAVENOUS

## 2021-04-29 ENCOUNTER — Other Ambulatory Visit (HOSPITAL_COMMUNITY): Payer: Medicare PPO

## 2021-04-29 ENCOUNTER — Encounter: Payer: Self-pay | Admitting: Physician Assistant

## 2021-04-29 NOTE — Telephone Encounter (Signed)
Notes faxed to surgeon. Tereso Newcomer, PA-C  04/29/2021 11:40 AM

## 2021-04-30 DIAGNOSIS — D123 Benign neoplasm of transverse colon: Secondary | ICD-10-CM | POA: Diagnosis not present

## 2021-04-30 DIAGNOSIS — K317 Polyp of stomach and duodenum: Secondary | ICD-10-CM | POA: Diagnosis not present

## 2021-04-30 DIAGNOSIS — K2970 Gastritis, unspecified, without bleeding: Secondary | ICD-10-CM | POA: Diagnosis not present

## 2021-04-30 DIAGNOSIS — K648 Other hemorrhoids: Secondary | ICD-10-CM | POA: Diagnosis not present

## 2021-04-30 DIAGNOSIS — R195 Other fecal abnormalities: Secondary | ICD-10-CM | POA: Diagnosis not present

## 2021-04-30 DIAGNOSIS — K449 Diaphragmatic hernia without obstruction or gangrene: Secondary | ICD-10-CM | POA: Diagnosis not present

## 2021-05-07 ENCOUNTER — Ambulatory Visit
Admission: RE | Admit: 2021-05-07 | Discharge: 2021-05-07 | Disposition: A | Discharge status: Home / Self Care | Payer: Medicare PPO | Source: Ambulatory Visit | Attending: Gastroenterology | Admitting: Gastroenterology

## 2021-05-07 DIAGNOSIS — R110 Nausea: Secondary | ICD-10-CM

## 2021-05-07 DIAGNOSIS — R748 Abnormal levels of other serum enzymes: Secondary | ICD-10-CM

## 2021-05-07 DIAGNOSIS — R945 Abnormal results of liver function studies: Secondary | ICD-10-CM | POA: Diagnosis not present

## 2021-05-07 DIAGNOSIS — K760 Fatty (change of) liver, not elsewhere classified: Secondary | ICD-10-CM | POA: Diagnosis not present

## 2021-05-08 DIAGNOSIS — K317 Polyp of stomach and duodenum: Secondary | ICD-10-CM | POA: Diagnosis not present

## 2021-05-08 DIAGNOSIS — D123 Benign neoplasm of transverse colon: Secondary | ICD-10-CM | POA: Diagnosis not present

## 2021-05-14 DIAGNOSIS — Z6831 Body mass index (BMI) 31.0-31.9, adult: Secondary | ICD-10-CM | POA: Diagnosis not present

## 2021-05-14 DIAGNOSIS — Z1231 Encounter for screening mammogram for malignant neoplasm of breast: Secondary | ICD-10-CM | POA: Diagnosis not present

## 2021-05-14 DIAGNOSIS — L900 Lichen sclerosus et atrophicus: Secondary | ICD-10-CM | POA: Diagnosis not present

## 2021-05-14 DIAGNOSIS — Z124 Encounter for screening for malignant neoplasm of cervix: Secondary | ICD-10-CM | POA: Diagnosis not present

## 2021-05-14 DIAGNOSIS — R3911 Hesitancy of micturition: Secondary | ICD-10-CM | POA: Diagnosis not present

## 2021-05-21 DIAGNOSIS — D649 Anemia, unspecified: Secondary | ICD-10-CM | POA: Diagnosis not present

## 2021-05-21 DIAGNOSIS — M791 Myalgia, unspecified site: Secondary | ICD-10-CM | POA: Diagnosis not present

## 2021-06-03 DIAGNOSIS — K5900 Constipation, unspecified: Secondary | ICD-10-CM | POA: Diagnosis not present

## 2021-06-03 DIAGNOSIS — D649 Anemia, unspecified: Secondary | ICD-10-CM | POA: Diagnosis not present

## 2021-06-18 DIAGNOSIS — R7303 Prediabetes: Secondary | ICD-10-CM | POA: Diagnosis not present

## 2021-06-18 DIAGNOSIS — I4891 Unspecified atrial fibrillation: Secondary | ICD-10-CM | POA: Diagnosis not present

## 2021-06-18 DIAGNOSIS — E876 Hypokalemia: Secondary | ICD-10-CM | POA: Diagnosis not present

## 2021-06-18 DIAGNOSIS — K219 Gastro-esophageal reflux disease without esophagitis: Secondary | ICD-10-CM | POA: Diagnosis not present

## 2021-06-18 DIAGNOSIS — D72829 Elevated white blood cell count, unspecified: Secondary | ICD-10-CM | POA: Diagnosis not present

## 2021-06-18 DIAGNOSIS — E782 Mixed hyperlipidemia: Secondary | ICD-10-CM | POA: Diagnosis not present

## 2021-06-18 DIAGNOSIS — D649 Anemia, unspecified: Secondary | ICD-10-CM | POA: Diagnosis not present

## 2021-06-18 DIAGNOSIS — I10 Essential (primary) hypertension: Secondary | ICD-10-CM | POA: Diagnosis not present

## 2021-06-18 DIAGNOSIS — M810 Age-related osteoporosis without current pathological fracture: Secondary | ICD-10-CM | POA: Diagnosis not present

## 2021-07-09 ENCOUNTER — Ambulatory Visit: Payer: Medicare PPO | Admitting: Physician Assistant

## 2021-07-25 DIAGNOSIS — R7303 Prediabetes: Secondary | ICD-10-CM | POA: Diagnosis not present

## 2021-07-25 DIAGNOSIS — I10 Essential (primary) hypertension: Secondary | ICD-10-CM | POA: Diagnosis not present

## 2021-07-25 DIAGNOSIS — E782 Mixed hyperlipidemia: Secondary | ICD-10-CM | POA: Diagnosis not present

## 2021-07-25 DIAGNOSIS — E876 Hypokalemia: Secondary | ICD-10-CM | POA: Diagnosis not present

## 2021-07-25 DIAGNOSIS — I4891 Unspecified atrial fibrillation: Secondary | ICD-10-CM | POA: Diagnosis not present

## 2021-07-25 DIAGNOSIS — Z7901 Long term (current) use of anticoagulants: Secondary | ICD-10-CM | POA: Diagnosis not present

## 2021-07-25 DIAGNOSIS — Z23 Encounter for immunization: Secondary | ICD-10-CM | POA: Diagnosis not present

## 2021-07-25 DIAGNOSIS — K219 Gastro-esophageal reflux disease without esophagitis: Secondary | ICD-10-CM | POA: Diagnosis not present

## 2021-07-28 DIAGNOSIS — M1712 Unilateral primary osteoarthritis, left knee: Secondary | ICD-10-CM | POA: Diagnosis not present

## 2021-07-28 DIAGNOSIS — M25561 Pain in right knee: Secondary | ICD-10-CM | POA: Diagnosis not present

## 2021-07-28 DIAGNOSIS — M170 Bilateral primary osteoarthritis of knee: Secondary | ICD-10-CM | POA: Diagnosis not present

## 2021-07-28 DIAGNOSIS — M25562 Pain in left knee: Secondary | ICD-10-CM | POA: Diagnosis not present

## 2021-07-28 DIAGNOSIS — M1711 Unilateral primary osteoarthritis, right knee: Secondary | ICD-10-CM | POA: Diagnosis not present

## 2021-08-19 DIAGNOSIS — M810 Age-related osteoporosis without current pathological fracture: Secondary | ICD-10-CM | POA: Diagnosis not present

## 2021-08-21 DIAGNOSIS — R0789 Other chest pain: Secondary | ICD-10-CM | POA: Diagnosis not present

## 2021-09-09 NOTE — Progress Notes (Deleted)
Cardiology Office Note:    Date:  09/09/2021   ID:  Vicki Morris, DOB 30-Jan-1936, MRN [id number]  PCP:  Merri Brunette, MD [facility] Cardiologist: Peter Swaziland, MD   Reason for visit: Follow-up  History of Present Illness:    Vicki Morris is a 85 y.o. female with a hx of HTN, HLD, hypothyroidism, PAF on chronic Coumadin therapy, GERD and a history of TIA.  Myoview and echo performed in June 2015 were unremarkable.   She was seen in the emergency room in December 2018 for chest pain.  She was admitted briefly.  Serial troponin was negative.  Potassium on arrival was low, however this was repleted.  Echocardiogram obtained on 09/21/2017 showed EF 55 to 60%, grade 1 DD, trivial mitral regurgitation, trivial aortic regurgitation, moderately dilated left atrium.  She was seen in the hospital by cardiology service, chest pain was felt to be very atypical.  Outpatient stress test was recommended.  She had Myoview performed on 09/30/2017 which showed EF 51%, small defect of mild severity in the basal inferolateral location which is partially reversible in the likely related to variation in the diaphragmatic attenuation artifact.  There was small defect of moderate severity in the apex location which is nonreversible consistent with attenuation artifact as well, no obvious ischemia was noted.  Overall this is considered a low risk study.    In December 2019 she had left knee surgery without complications. On follow up today she is still somewhat deconditioned. She enjoys working in her flowers. Denies any real episodes of Afib but occasionally will note a skipped beat.   She last saw Dr. Swaziland in May 2020.  She saw Tereso Newcomer for preop clearance in July 2022.  Patient has significant dyspnea with exertion.  Stress test was scheduled & was normal.  Echo with normal EF, grade 1 diastolic function & no significant valve disease.    Today, ***  1.Paroxysmal atrial fibrillation: Coumadin  managed by her PCPs office and has been therapeutic. On Cardizem for rate control.    2.Hypertension: Blood pressure well controlled on current medication   3.Hyperlipidemia: On Crestor 10 mg daily. She is scheduled for yearly lab work with Dr Renne Crigler   4. Hypothyroidism: On Synthroid.  Managed by primary care provider.    Past Medical History:  Diagnosis Date   Allergic rhinitis, cause unspecified    Anxiety    Arthritis    Chronic anticoagulation    Depression    Dysrhythmia    afib    GERD (gastroesophageal reflux disease)    History of echocardiogram    Echo 7/22: EF 55-60, no RWMA, mild asymmetric LVH, normal RVSF, mild LAE, trivial MR, mild AV sclerosis without stenosis   History of TIAs    Hyperlipidemia    Hypertension    Hypothyroidism    Normal nuclear stress test 12/2011   EF 64%, clinically and electrically negative for ischemia. Myoview scan with apical thinning, could not exclude small subendocardial scar. No ischemia. // Myoview 7/22: EF 58, no ischemia, low risk   Obesity    PAF (paroxysmal atrial fibrillation) (HCC)    Peripheral vascular disease (HCC)    Pneumonia    hx of    Polyp of nasal cavity    PONV (postoperative nausea and vomiting)    Stroke (HCC)    mini stroke  years ago    Urinary tract infection    hx of     Past Surgical History:  Procedure Laterality Date   BLADDER SURGERY     CARPAL TUNNEL RELEASE     KNEE ARTHROSCOPY Left 09/14/2018   Procedure: PARITAL MEDIAL, PARTIAL LATERAL MENISCECTOMY, PATELLA FEMORAL SUBCHONDROPLASTY, TIBIAL PLATEAU KNEE ARTHROSCOPY;  Surgeon: Dorna Leitz, MD;  Location: [facility];  Service: Orthopedics;  Laterality: Left;   NASAL SINUS SURGERY     right little toe surgery      TOTAL ABDOMINAL HYSTERECTOMY W/ BILATERAL SALPINGOOPHORECTOMY     US ECHOCARDIOGRAPHY  01/23/2010   EF 55-60%    Current Medications: No outpatient medications have been marked as taking for the 09/10/21 encounter (Appointment) with  Warren Lacy, PA-C.     Allergies:   Other, Contrast media [iodinated diagnostic agents], Sulfonamide derivatives, Clarithromycin, and Nabumetone   Social History   Socioeconomic History   Marital status: Widowed    Spouse name: Not on file   Number of children: Not on file   Years of education: Not on file   Highest education level: Not on file  Occupational History   Occupation: Retired  Tobacco Use   Smoking status: Never   Smokeless tobacco: Never  Vaping Use   Vaping Use: Never used  Substance and Sexual Activity   Alcohol use: No   Drug use: No   Sexual activity: Never  Other Topics Concern   Not on file  Social History Narrative   Not on file   Social Determinants of Health   Financial Resource Strain: Not on file  Food Insecurity: Not on file  Transportation Needs: Not on file  Physical Activity: Not on file  Stress: Not on file  Social Connections: Not on file     Family History: The patient's family history includes Breast cancer in her paternal aunt; Heart attack in her father; Heart disease in her father and mother.  ROS:   Please see the history of present illness.     EKGs/Labs/Other Studies Reviewed:    EKG:  The ekg ordered today demonstrates ***  Recent Labs: No results found for requested labs within last 8760 hours.   Recent Lipid Panel No results found for: CHOL, TRIG, HDL, LDLCALC, LDLDIRECT  Physical Exam:    VS:  There were no vitals taken for this visit.   No data found.  Wt Readings from Last 3 Encounters:  04/28/21 182 lb (82.6 kg)  04/23/21 182 lb 12.8 oz (82.9 kg)  09/09/18 194 lb (88 kg)     GEN: *** Well nourished, well developed in no acute distress HEENT: Normal NECK: No JVD; No carotid bruits CARDIAC: ***RRR, no murmurs, rubs, gallops RESPIRATORY:  Clear to auscultation without rales, wheezing or rhonchi  ABDOMEN: Soft, non-tender, non-distended MUSCULOSKELETAL: No edema; No deformity  SKIN: Warm and  dry NEUROLOGIC:  Alert and oriented PSYCHIATRIC:  Normal affect     ASSESSMENT AND PLAN   ***   {Are you ordering a CV Procedure (e.g. stress test, cath, DCCV, TEE, etc)?   Press F2        :[id number]    Medication Adjustments/Labs and Tests Ordered: Current medicines are reviewed at length with the patient today.  Concerns regarding medicines are outlined above.  No orders of the defined types were placed in this encounter.  No orders of the defined types were placed in this encounter.   There are no Patient Instructions on file for this visit.   Signed, Warren Lacy, PA-C  09/09/2021 3:08 PM    [facility]

## 2021-09-10 ENCOUNTER — Ambulatory Visit: Payer: Medicare PPO | Admitting: Physician Assistant

## 2021-09-10 DIAGNOSIS — I10 Essential (primary) hypertension: Secondary | ICD-10-CM

## 2021-09-10 DIAGNOSIS — I480 Paroxysmal atrial fibrillation: Secondary | ICD-10-CM

## 2021-10-24 ENCOUNTER — Ambulatory Visit: Payer: Medicare PPO | Admitting: Nurse Practitioner

## 2021-10-24 ENCOUNTER — Other Ambulatory Visit: Payer: Self-pay

## 2021-10-24 ENCOUNTER — Encounter: Payer: Self-pay | Admitting: Adult Health

## 2021-10-24 VITALS — BP 128/72 | HR 59 | Ht 64.0 in | Wt 193.8 lb

## 2021-10-24 DIAGNOSIS — R0789 Other chest pain: Secondary | ICD-10-CM

## 2021-10-24 DIAGNOSIS — I480 Paroxysmal atrial fibrillation: Secondary | ICD-10-CM | POA: Diagnosis not present

## 2021-10-24 DIAGNOSIS — I10 Essential (primary) hypertension: Secondary | ICD-10-CM

## 2021-10-24 DIAGNOSIS — E782 Mixed hyperlipidemia: Secondary | ICD-10-CM

## 2021-10-24 DIAGNOSIS — R0609 Other forms of dyspnea: Secondary | ICD-10-CM

## 2021-10-24 NOTE — Patient Instructions (Signed)
Medication Instructions:  No Changes *If you need a refill on your cardiac medications before your next appointment, please call your pharmacy*   Lab Work: No labs If you have labs (blood work) drawn today and your tests are completely normal, you will receive your results only by: MyChart Message (if you have MyChart) OR A paper copy in the mail If you have any lab test that is abnormal or we need to change your treatment, we will call you to review the results.   Testing/Procedures:[address], [address] Your physician has requested that you have a lexiscan myoview. For further information please visit [email or web address] Please follow instruction sheet, as given.    Follow-Up: At [facility], you and your health needs are our priority.  As part of our continuing mission to provide you with exceptional heart care, we have created designated Provider Care Teams.  These Care Teams include your primary Cardiologist (physician) and Advanced Practice Providers (APPs -  Physician Assistants and Nurse Practitioners) who all work together to provide you with the care you need, when you need it.  We recommend signing up for the patient portal called "MyChart".  Sign up information is provided on this After Visit Summary.  MyChart is used to connect with patients for Virtual Visits (Telemedicine).  Patients are able to view lab/test results, encounter notes, upcoming appointments, etc.  Non-urgent messages can be sent to your provider as well.   To learn more about what you can do with MyChart, go to [email or web address].    Your next appointment:   3-4 week(s)  The format for your next appointment:   In Person  Provider:   Joni Reining, DNP, ANP    Then, Peter Swaziland, MD will plan to see you again in 3 month(s)

## 2021-10-24 NOTE — Progress Notes (Signed)
Office Visit    Patient Name: Vicki Morris Date of Encounter: 10/24/2021  Primary Care Provider:  Deland Pretty, MD Primary Cardiologist:  Peter Martinique, MD  Chief Complaint    86 year old female with a history of paroxysmal atrial fibrillation on Xarelto, atypical chest pain, dyspnea on exertion, hypertension, hyperlipidemia, TIA, hypothyroidism, anemia, and GERD who presents for follow-up related to atrial fibrillation and dyspnea on exertion.  Past Medical History    Past Medical History:  Diagnosis Date   Allergic rhinitis, cause unspecified    Anxiety    Arthritis    Chronic anticoagulation    Depression    Dysrhythmia    afib    GERD (gastroesophageal reflux disease)    History of echocardiogram    Echo 7/22: EF 55-60, no RWMA, mild asymmetric LVH, normal RVSF, mild LAE, trivial MR, mild AV sclerosis without stenosis   History of TIAs    Hyperlipidemia    Hypertension    Hypothyroidism    Normal nuclear stress test 12/2011   EF 64%, clinically and electrically negative for ischemia. Myoview scan with apical thinning, could not exclude small subendocardial scar. No ischemia. // Myoview 7/22: EF 58, no ischemia, low risk   Obesity    PAF (paroxysmal atrial fibrillation) (HCC)    Peripheral vascular disease (HCC)    Pneumonia    hx of    Polyp of nasal cavity    PONV (postoperative nausea and vomiting)    Stroke ([place])    mini stroke  years ago    Urinary tract infection    hx of    Past Surgical History:  Procedure Laterality Date   BLADDER SURGERY     CARPAL TUNNEL RELEASE     KNEE ARTHROSCOPY Left 09/14/2018   Procedure: PARITAL MEDIAL, PARTIAL LATERAL MENISCECTOMY, PATELLA FEMORAL SUBCHONDROPLASTY, TIBIAL PLATEAU KNEE ARTHROSCOPY;  Surgeon: Dorna Leitz, MD;  Location: [facility];  Service: Orthopedics;  Laterality: Left;   NASAL SINUS SURGERY     right little toe surgery      TOTAL ABDOMINAL HYSTERECTOMY W/ BILATERAL SALPINGOOPHORECTOMY     US  ECHOCARDIOGRAPHY  01/23/2010   EF 55-60%    Allergies  Allergies  Allergen Reactions   Other Anaphylaxis and Other (See Comments)    Red meat, Pork, Beef, Lamb   Contrast Media [Iodinated Contrast Media] Hives   Sulfonamide Derivatives Other (See Comments)    Unknown   Clarithromycin Nausea And Vomiting   Nabumetone Rash    History of Present Illness    86 year old female with the above past medical history including paroxysmal atrial fibrillation on Xarelto, atypical chest pain, dyspnea on exertion, hypertension, hyperlipidemia, TIA, hypothyroidism, anemia, and GERD.   She has a history of atypical chest pain. Myoview in 2013, 2015 and 2018, were considered low risk.  She has a history TIA and paroxysmal atrial fibrillation, on chronic Xarelto. She was last seen in the office on April 23, 2021 for preoperative clearance and reported increased dyspnea on exertion. Though her symptoms were thought to be exacerbated by anemia and physical deconditioning, she underwent repeat stress testing, which showed a prior apical defect, seen in 2018 Myoview, considered low risk. Echocardiogram in July 2022 showed an EF of 55 to 60%, G1 DD.    She presents today for follow-up accompanied by her daughter. She states that her PCP advised her to follow-up with cardiology for a 2 to 25-month history of right mid/upper back pain that occurs only with exertion. She has  stable chronic dyspnea on exertion, which is unchanged. She also reports an episode of midsternal chest pain that awoke her in the middle of the night that occurred approximately 2 months ago. Her blood pressure was elevated at the time 180/80.  Her symptoms resolved spontaneously.  Additionally, she had intermittent fleeting palpitations that lasted all day on 10/19/2021 with no associated symptoms. She is very frustrated that her back pain is limiting her activity and causing her to feel more anxious and depressed as she is not able to do complete  her normal daily activities.   Home Medications    Current Outpatient Medications  Medication Sig Dispense Refill   acetaminophen (TYLENOL) 500 MG tablet as needed.     betamethasone valerate (VALISONE) 0.1 % cream Apply 1 application topically every other day.     Calcium Carb-Cholecalciferol 600-10 MG-MCG TABS 1 tablet     denosumab (PROLIA) 60 MG/ML SOSY injection 60 mg     diltiazem (CARDIZEM CD) 240 MG 24 hr capsule Take 240 mg by mouth at bedtime.      diphenhydrAMINE (BENADRYL) 25 mg capsule Take 25 mg by mouth daily as needed for allergies.      EPINEPHrine 0.3 mg/0.3 mL IJ SOAJ injection Inject 0.3 mg into the muscle once.     ferrous sulfate 325 (65 FE) MG EC tablet 1 tablet     LORazepam (ATIVAN) 0.5 MG tablet Take 0.5 mg by mouth at bedtime as needed for sleep.      olmesartan-hydrochlorothiazide (BENICAR HCT) 20-12.5 MG tablet Take 1 tablet by mouth daily.  0   omeprazole (PRILOSEC) 20 MG capsule 1 capsule 30 minutes before morning meal     polyethylene glycol (MIRALAX / GLYCOLAX) 17 g packet 1 packet mixed with 8 ounces of fluid     potassium chloride (KLOR-CON) 20 MEQ packet Take 20 mEq by mouth every other day.      rivaroxaban (XARELTO) 20 MG TABS tablet Take 20 mg by mouth daily with supper.     Semaglutide,0.25 or 0.5MG /DOS, (OZEMPIC, 0.25 OR 0.5 MG/DOSE,) 2 MG/1.5ML SOPN 0.25-0.5 mg     sertraline (ZOLOFT) 50 MG tablet Take 50 mg by mouth at bedtime.      SYNTHROID 100 MCG tablet Take 100 mcg by mouth daily before breakfast.   0   trimethoprim (TRIMPEX) 100 MG tablet Take 100 mg by mouth daily.   0   amitriptyline (ELAVIL) 10 MG tablet TK 1 T PO HS (Patient not taking: Reported on 10/24/2021)     ezetimibe (ZETIA) 10 MG tablet Take 1 tablet by mouth daily. (Patient not taking: Reported on 10/24/2021)     rosuvastatin (CRESTOR) 10 MG tablet Take 10 mg by mouth at bedtime.  (Patient not taking: Reported on 10/24/2021)  0   No current facility-administered medications for  this visit.     Review of Systems    She denies chest pain, palpitations, dyspnea, pnd, orthopnea, n, v, dizziness, syncope, edema, weight gain, or early satiety. All other systems reviewed and are otherwise negative except as noted above.   Physical Exam    VS:  BP 128/72    Pulse (!) 59    Ht 5\' 4"  (1.626 m)    Wt 193 lb 12.8 oz (87.9 kg)    SpO2 96%    BMI 33.27 kg/m   GEN: Well nourished, well developed, in no acute distress. HEENT: normal. Neck: Supple, no JVD, carotid bruits, or masses. Cardiac: RRR, no murmurs, rubs, or gallops.  No clubbing, cyanosis, edema.  Radials/DP/PT 2+ and equal bilaterally.  Respiratory:  Respirations regular and unlabored, clear to auscultation bilaterally. GI: Soft, nontender, nondistended, BS + x 4. MS: no deformity or atrophy. Skin: warm and dry, no rash. Neuro:  Strength and sensation are intact. Psych: Normal affect.  Accessory Clinical Findings    ECG personally reviewed by me today -Sinus bradycardia, 59 bpm, non-specific t-wave abnormality - no acute changes.  Lab Results  Component Value Date   WBC 7.9 09/09/2018   HGB 11.5 (L) 09/09/2018   HCT 37.5 09/09/2018   MCV 85.6 09/09/2018   PLT 309 09/09/2018   Lab Results  Component Value Date   CREATININE 0.88 09/09/2018   BUN 21 09/09/2018   NA 142 09/09/2018   K 3.7 09/09/2018   CL 106 09/09/2018   CO2 25 09/09/2018   No results found for: ALT, AST, GGT, ALKPHOS, BILITOT No results found for: CHOL, HDL, LDLCALC, LDLDIRECT, TRIG, CHOLHDL  No results found for: HGBA1C  Assessment & Plan    1. Atypical chest pain/dyspnea on exertion: Myoview in 2013, 2015 and 2018, and 2022 were considered low risk. Echocardiogram in July 2022 showed an EF of 55 to 60%, G1 DD.  She reports 1 episode of midsternal chest pain that woke her from her sleep and resolved spontaneously.  Additionally, she was advised to follow-up with cardiology by her PCP for a 2 to 62-month history of R sided mid/upper  back pain with exertion, no associated symptoms.  Her pain does improve after taking Tylenol and with rest, but reoccur with any activity.  EKG today is unchanged from prior. She does not think that her symptoms are related to her heart, however, she and her daughter are both concerned that her back pain will not be properly treated until cardiac etiology is ruled out. Symptoms do sound atypical for angina and are quite possibly musculoskeletal in nature. However, symptoms are limiting her quality of life currently as well as her ability to go about her daily activities. She is not a candidate for coronary CTA given history of contrast allergy. Following a lengthy conversation and through shared decision making, she agrees to pursue repeat Lexiscan Myoview to rule out cardiac cause of symptoms. Will order 2-day Lexiscan Myoview to allow for best images as she does have larger breast tissue. She is not taking atorvastatin, not on ASA given chronic DOAC therapy.  Continue olmesartan-hydrochlorothiazide, diltiazem, and Zetia.  2. Paroxysmal atrial fibrillation: She did have an episode of intermittent fleeting palpitations that lasted all day on 10/19/2021 with no associated symptoms.  EKG today shows normal sinus rhythm, 59 bpm.  Denies bleeding.  I advised her to notify us should her palpitations become more persistent or severe.  For now, continue diltiazem, Xarelto.  3. Hypertension: Isolated episode of elevated blood pressure in the setting of sudden onset midsternal chest pain as above.  Otherwise, her BP well controlled. Continue current antihypertensive regimen.   4. Hyperlipidemia: No recent lipid panel on file.  She reports not taking her rosuvastatin.  Unfortunately, we did not have time to discuss this further at today's visit.  For now, continue Zetia and we can revisit the subject of her cholesterol management at her follow-up appointment.  5. Disposition: Follow-up with Dr. Martinique as previously  scheduled in February 2023.  Lenna Sciara, NP 10/24/2021, 3:49 PM

## 2021-10-25 ENCOUNTER — Other Ambulatory Visit: Payer: Self-pay

## 2021-10-25 ENCOUNTER — Emergency Department (HOSPITAL_COMMUNITY): Payer: Medicare PPO

## 2021-10-25 ENCOUNTER — Emergency Department (HOSPITAL_COMMUNITY)
Admission: EM | Admit: 2021-10-25 | Discharge: 2021-10-25 | Disposition: A | Discharge status: Home / Self Care | Payer: Medicare PPO | Attending: Emergency Medicine | Admitting: Emergency Medicine

## 2021-10-25 DIAGNOSIS — E876 Hypokalemia: Secondary | ICD-10-CM | POA: Diagnosis not present

## 2021-10-25 DIAGNOSIS — Z7901 Long term (current) use of anticoagulants: Secondary | ICD-10-CM | POA: Diagnosis not present

## 2021-10-25 DIAGNOSIS — K449 Diaphragmatic hernia without obstruction or gangrene: Secondary | ICD-10-CM | POA: Diagnosis not present

## 2021-10-25 DIAGNOSIS — Z20822 Contact with and (suspected) exposure to covid-19: Secondary | ICD-10-CM | POA: Insufficient documentation

## 2021-10-25 DIAGNOSIS — D649 Anemia, unspecified: Secondary | ICD-10-CM | POA: Insufficient documentation

## 2021-10-25 DIAGNOSIS — K219 Gastro-esophageal reflux disease without esophagitis: Secondary | ICD-10-CM | POA: Insufficient documentation

## 2021-10-25 DIAGNOSIS — R7309 Other abnormal glucose: Secondary | ICD-10-CM | POA: Diagnosis not present

## 2021-10-25 DIAGNOSIS — I10 Essential (primary) hypertension: Secondary | ICD-10-CM | POA: Diagnosis not present

## 2021-10-25 DIAGNOSIS — R079 Chest pain, unspecified: Secondary | ICD-10-CM | POA: Diagnosis not present

## 2021-10-25 DIAGNOSIS — E860 Dehydration: Secondary | ICD-10-CM | POA: Diagnosis not present

## 2021-10-25 DIAGNOSIS — R944 Abnormal results of kidney function studies: Secondary | ICD-10-CM | POA: Insufficient documentation

## 2021-10-25 DIAGNOSIS — R0789 Other chest pain: Secondary | ICD-10-CM | POA: Diagnosis not present

## 2021-10-25 LAB — CBC WITH DIFFERENTIAL/PLATELET
Abs Immature Granulocytes: 0.02 10*3/uL (ref 0.00–0.07)
Basophils Absolute: 0.1 10*3/uL (ref 0.0–0.1)
Basophils Relative: 1 %
Eosinophils Absolute: 0.4 10*3/uL (ref 0.0–0.5)
Eosinophils Relative: 6 %
HCT: 34.6 % — ABNORMAL LOW (ref 36.0–46.0)
Hemoglobin: 11.1 g/dL — ABNORMAL LOW (ref 12.0–15.0)
Immature Granulocytes: 0 %
Lymphocytes Relative: 24 %
Lymphs Abs: 1.6 10*3/uL (ref 0.7–4.0)
MCH: 27.5 pg (ref 26.0–34.0)
MCHC: 32.1 g/dL (ref 30.0–36.0)
MCV: 85.9 fL (ref 80.0–100.0)
Monocytes Absolute: 0.9 10*3/uL (ref 0.1–1.0)
Monocytes Relative: 13 %
Neutro Abs: 3.8 10*3/uL (ref 1.7–7.7)
Neutrophils Relative %: 56 %
Platelets: 274 10*3/uL (ref 150–400)
RBC: 4.03 MIL/uL (ref 3.87–5.11)
RDW: 13.6 % (ref 11.5–15.5)
WBC: 6.8 10*3/uL (ref 4.0–10.5)
nRBC: 0 % (ref 0.0–0.2)

## 2021-10-25 LAB — BASIC METABOLIC PANEL
Anion gap: 11 (ref 5–15)
BUN: 21 mg/dL (ref 8–23)
CO2: 25 mmol/L (ref 22–32)
Calcium: 8.9 mg/dL (ref 8.9–10.3)
Chloride: 103 mmol/L (ref 98–111)
Creatinine, Ser: 1.07 mg/dL — ABNORMAL HIGH (ref 0.44–1.00)
GFR, Estimated: 52 mL/min — ABNORMAL LOW (ref >60)
Glucose, Bld: 123 mg/dL — ABNORMAL HIGH (ref 70–99)
Potassium: 3.2 mmol/L — ABNORMAL LOW (ref 3.5–5.1)
Sodium: 139 mmol/L (ref 135–145)

## 2021-10-25 LAB — RESP PANEL BY RT-PCR (FLU A&B, COVID) ARPGX2
Influenza A by PCR: NEGATIVE
Influenza B by PCR: NEGATIVE
SARS Coronavirus 2 by RT PCR: NEGATIVE

## 2021-10-25 LAB — TROPONIN I (HIGH SENSITIVITY)
Troponin I (High Sensitivity): 15 ng/L (ref <18)
Troponin I (High Sensitivity): 16 ng/L (ref <18)

## 2021-10-25 MED ORDER — NITROGLYCERIN 0.4 MG SL SUBL: 0.4000 mg | SUBLINGUAL_TABLET | SUBLINGUAL | 0 refills | Status: DC | PRN

## 2021-10-25 MED ORDER — POTASSIUM CHLORIDE CRYS ER 20 MEQ PO TBCR
40.0000 meq | EXTENDED_RELEASE_TABLET | Freq: Once | ORAL | Status: AC
  Administered 2021-10-25: 40 meq via ORAL
  Filled 2021-10-25: qty 2

## 2021-10-25 MED ORDER — SUCRALFATE 1 G PO TABS
1.0000 g | ORAL_TABLET | Freq: Three times a day (TID) | ORAL | 0 refills | Status: DC
Start: 2021-10-25 — End: 2022-01-05

## 2021-10-25 NOTE — Discharge Instructions (Signed)
As we discussed your chest pain does seem consistent with acid reflux today, however you do have significant risk factors for a cardiac cause of chest pain which we want to take seriously.  After speaking with cardiology they do recommend prescribing you some sublingual nitroglycerin, you can place this tablet under your tongue if you have shortness of breath, chest pain, chest pressure with exertion.  Additionally continue to take your medicine for acid reflux, I prescribed you new medication Carafate which you can take with meals, and to help coat your stomach.  If you have that sudden onset burning sensation I do recommend using some Tums to help to decrease the amount of acid in your stomach in the moment.  Please follow-up with your cardiologist as planned on the 30th, if you have new or worsening chest pain, shortness of breath in the meantime please return to the emergency department for further evaluation.  It was a pleasure taking care of you today, I hope that you feel better soon.

## 2021-10-25 NOTE — ED Provider Notes (Signed)
[facility] Provider Note   CSN: [id number] Arrival date & time: 10/25/21  0530     History  Chief Complaint  Patient presents with   Chest Pain    Vicki Morris is a 86 y.o. female Patient with a history of hypertension, hypercholesterolemia, acid reflux, borderline diabetes, first-degree family history of ACS presents with burning chest pain, pressure since around 330 or 4 AM this morning.  Patient reports that she has been having worsening shortness of breath, pain in her back with exertion for the last several weeks, spoke to a cardiologist yesterday and has a stress test scheduled next couple of days.  Primary cardiologist Peter Martinique.  She reports the burning this morning radiated into her throat and her ears, lasted for about 20 minutes before resolving.  Patient does take PPI daily.  Patient denies any chest pain since then.  Did feel some shortness of breath, diaphoresis at the time.  No radiation to arm, numbness.  No nausea, vomiting.     Chest Pain     Home Medications Prior to Admission medications   Medication Sig Start Date End Date Taking? Authorizing Provider  acetaminophen (TYLENOL) 500 MG tablet Take 1,000 mg by mouth every 6 (six) hours as needed for moderate pain or headache.   Yes [provider]  Calcium Carb-Cholecalciferol 600-10 MG-MCG TABS Take 1 tablet by mouth daily.   Yes [provider]  clobetasol ointment (TEMOVATE) [id number] % Apply 1 application topically at bedtime as needed. 08/02/21  Yes [provider]  denosumab (PROLIA) 60 MG/ML SOSY injection Inject 60 mg into the skin every 6 (six) months.   Yes [provider]  diltiazem (CARDIZEM CD) 240 MG 24 hr capsule Take 240 mg by mouth at bedtime.  02/28/13  Yes [provider]  diphenhydrAMINE (BENADRYL) 25 mg capsule Take 25 mg by mouth daily as needed for allergies.    Yes [provider]  EPINEPHrine 0.3  mg/0.3 mL IJ SOAJ injection Inject 0.3 mg into the muscle as needed for anaphylaxis.   Yes [provider]  ferrous sulfate 325 (65 FE) MG EC tablet Take 325 mg by mouth daily with breakfast.   Yes [provider]  LORazepam (ATIVAN) 0.5 MG tablet Take 0.5 mg by mouth at bedtime as needed for sleep.    Yes [provider]  nitrofurantoin, macrocrystal-monohydrate, (MACROBID) 100 MG capsule Take 100 mg by mouth daily. Continuously 09/13/21  Yes [provider]  nitroGLYCERIN (NITROSTAT) 0.4 MG SL tablet Place 1 tablet (0.4 mg total) under the tongue every 5 (five) minutes as needed for chest pain. 10/25/21  Yes Verle Wheeling H, PA-C  olmesartan-hydrochlorothiazide (BENICAR HCT) 20-12.5 MG tablet Take 1 tablet by mouth daily. 09/03/17  Yes [provider]  omeprazole (PRILOSEC) 20 MG capsule Take 20 mg by mouth daily. 06/24/21  Yes [provider]  polyethylene glycol (MIRALAX / GLYCOLAX) 17 g packet Take 17 g by mouth daily as needed for mild constipation.   Yes [provider]  Potassium Chloride ER 20 MEQ TBCR Take 1 tablet by mouth every other day. 08/13/21  Yes [provider]  rivaroxaban (XARELTO) 20 MG TABS tablet Take 20 mg by mouth daily with supper.   Yes [provider]  Semaglutide,0.25 or 0.5MG /DOS, (OZEMPIC, 0.25 OR 0.5 MG/DOSE,) 2 MG/1.5ML SOPN Inject 0.25 mg into the skin every Sunday.   Yes [provider]  sertraline (ZOLOFT) 50 MG tablet Take 50  mg by mouth at bedtime.    Yes [provider]  sucralfate (CARAFATE) 1 g tablet Take 1 tablet (1 g total) by mouth 4 (four) times daily -  with meals and at bedtime. 10/25/21  Yes Denym Christenberry H, PA-C  SYNTHROID 100 MCG tablet Take 100 mcg by mouth daily before breakfast.  07/12/17  Yes [provider]      Allergies    Other, Beef-derived products, Contrast media [iodinated contrast media], Sulfonamide derivatives,  Clarithromycin, and Nabumetone    Review of Systems   Review of Systems  Cardiovascular:  Positive for chest pain.  All other systems reviewed and are negative.  Physical Exam Updated Vital Signs BP (!) 159/78    Pulse (!) 55    Temp 98.3 F (36.8 C) (Oral)    Resp 18    Ht 5\' 4"  (1.626 m)    Wt 87.5 kg    SpO2 97%    BMI 33.13 kg/m  Physical Exam Vitals and nursing note reviewed.  Constitutional:      General: She is not in acute distress.    Appearance: Normal appearance.  HENT:     Head: Normocephalic and atraumatic.  Eyes:     General:        Right eye: No discharge.        Left eye: No discharge.  Cardiovascular:     Rate and Rhythm: Normal rate and regular rhythm.     Heart sounds: No murmur heard.   No friction rub. No gallop.  Pulmonary:     Effort: Pulmonary effort is normal.     Breath sounds: Normal breath sounds.  Chest:     Comments: No tenderness to palpation chest wall. Abdominal:     General: Bowel sounds are normal.     Palpations: Abdomen is soft.     Comments: No tenderness to palpation epigastric region, no rebound, rigidity, guarding throughout.  Skin:    General: Skin is warm and dry.     Capillary Refill: Capillary refill takes less than 2 seconds.  Neurological:     Mental Status: She is alert and oriented to person, place, and time.  Psychiatric:        Mood and Affect: Mood normal.        Behavior: Behavior normal.    ED Results / Procedures / Treatments   Labs (all labs ordered are listed, but only abnormal results are displayed) Labs Reviewed  BASIC METABOLIC PANEL - Abnormal; Notable for the following components:      Result Value   Potassium 3.2 (*)    Glucose, Bld 123 (*)    Creatinine, Ser 1.07 (*)    GFR, Estimated 52 (*)    All other components within normal limits  CBC WITH DIFFERENTIAL/PLATELET - Abnormal; Notable for the following components:   Hemoglobin 11.1 (*)    HCT 34.6 (*)    All other components within normal  limits  RESP PANEL BY RT-PCR (FLU A&B, COVID) ARPGX2  TROPONIN I (HIGH SENSITIVITY)  TROPONIN I (HIGH SENSITIVITY)    EKG None  Radiology DG Chest Port 1 View  Result Date: 10/25/2021 CLINICAL DATA:  86 year old female with chest pain. EXAM: PORTABLE CHEST 1 VIEW COMPARISON:  Portable chest 11/15/2019 and earlier. CT Abdomen and Pelvis 05/09/2020. FINDINGS: Portable AP semi upright view at 0608 hours. Stable lung volumes and mediastinal contours, with eventration of the diaphragm and chronic hiatal hernia. Visualized tracheal air column is within normal limits. Allowing  for portable technique the lungs are clear. No pneumothorax or pleural effusion. No acute osseous abnormality identified. IMPRESSION: No acute cardiopulmonary abnormality. Chronic hiatal hernia. Electronically Signed   By: Genevie Ann M.D.   On: 10/25/2021 06:48    Procedures Procedures    Medications Ordered in ED Medications  potassium chloride SA (KLOR-CON M) CR tablet 40 mEq (40 mEq Oral Given 10/25/21 0724)    ED Course/ Medical Decision Making/ A&P Clinical Course as of 10/25/21 1123  Sat Oct 25, 2021  1021 Dr. Johnsie Cancel [CP]    Clinical Course User Index [CP] Anselmo Pickler, PA-C                           Medical Decision Making Amount and/or Complexity of Data Reviewed Labs: ordered. Radiology: ordered.  Risk Prescription drug management.   I discussed this case with my attending physician who cosigned this note including patient's presenting symptoms, physical exam, and planned diagnostics and interventions. Attending physician stated agreement with plan or made changes to plan which were implemented.   This patient presents to the ED for concern of chest pain, burning for 15 to 20 minutes at around 330, 4 AM, this involves an extensive number of treatment options, and is a complaint that carries with it a high risk of complications and morbidity. The emergent differential diagnosis includes, but is  not limited to, ACS, PE, Boerhaave's, dissection, aneurysm, GERD, hiatal hernia versus other.  This is not an exhaustive differential.  Co morbidities that complicate the patient evaluation: Hypertension, hypercholesterolemia, first-degree family history of ACS, borderline diabetes. Social Determinants of Health: Patient does have close follow-up with cardiology, plan for reevaluation on 11/03/2021.  Additional history obtained from patient's husband. External records from outside source obtained and reviewed including previous cardiology visits, previous stress test July 2022.  Physical Exam: Physical exam performed. The pertinent findings include: Reassuring physical exam, no new murmurs, no TTP epigastric region or chest wall.  Lab Tests: I Ordered, and personally interpreted labs.  The pertinent results include:  Mild anemia, hemoglobin 11.1.  Negative RVP.  BMP significant for mild hypokalemia, we will orally replete.  Mild hyperglycemia glucose 123.  Slightly increased creatinine of 1.07, favor minimal dehydration, encourage oral intake.  Negative troponin x2, 15, 16.   Imaging Studies: I ordered imaging studies including chest x-ray. I independently visualized and interpreted imaging which showed hiatal hernia but no other intrathoracic abnormality. I agree with the radiologist interpretation.   Cardiac Monitoring:  The patient was maintained on a cardiac monitor.  My attending physician Dr. Alvino Chapel viewed and interpreted the cardiac monitored which showed an underlying rhythm of: Normal sinus rhythm, some LVH.   Medications: I ordered medication including potassium chloride for hypokalemia. Reevaluation of the patient after these medicines showed that the patient stayed the same. I have reviewed the patients home medicines and have made adjustments as needed.  Consultations Obtained: I requested consultation with the cardiology team, spoke with Dr. Johnsie Cancel,  and discussed lab and  imaging findings as well as pertinent plan - they recommend: Follow-up as planned with cardiology on 1/30.  Prescribed nitroglycerin for exertional shortness of breath, and interval chest pain.   Disposition: After consideration of the diagnostic results and the patients response to treatment, I feel that patient symptoms are consistent with acid reflux, hiatal hernia, and she has a clean cardiac work-up today.  She is high risk for chest pain with a heart score  of 6, however after speaking with cardiology believe that patient is safe to follow-up on an outpatient basis, strict return precautions given, we will dispel with nitroglycerin, as well as Carafate, encouraged to use her PPI, and Tums for acid reflux symptoms.  Patient discharged in stable condition at this time, return precautions given..    Final Clinical Impression(s) / ED Diagnoses Final diagnoses:  Chest pain, unspecified type  Gastroesophageal reflux disease, unspecified whether esophagitis present    Rx / DC Orders ED Discharge Orders          Ordered    nitroGLYCERIN (NITROSTAT) 0.4 MG SL tablet  Every 5 min PRN        10/25/21 1034    sucralfate (CARAFATE) 1 g tablet  3 times daily with meals & bedtime        10/25/21 1034              Chamara Dyck, Joesph Fillers, PA-C 10/25/21 1124    Davonna Belling, MD 10/25/21 1942

## 2021-10-25 NOTE — ED Triage Notes (Signed)
Pt arrived via [facility] with c/c of Chest Pain. Per EMS pt woke around 0400 with burning in chest that radiated to ears. Pt hit medical alarm. Pt has hx of A-Fib. Recently seen at [facility] on 1/20 and schedule for stress test for 1/23  140/80 --> 160/80, NRS, 60-70HR, CBG 128, 98%  324 ASA

## 2021-10-27 ENCOUNTER — Telehealth (HOSPITAL_COMMUNITY): Payer: Self-pay | Admitting: *Deleted

## 2021-10-27 NOTE — Telephone Encounter (Signed)
Left message on voicemail per DPR in reference to upcoming appointment scheduled on 11/03/21 at 10:45 with detailed instructions given per Myocardial Perfusion Study Information Sheet for the test. LM to arrive 15 minutes early, and that it is imperative to arrive on time for appointment to keep from having the test rescheduled. If you need to cancel or reschedule your appointment, please call the office within 24 hours of your appointment. Failure to do so may result in a cancellation of your appointment, and a $50 no show fee. Phone number given for call back for any questions.

## 2021-10-28 DIAGNOSIS — Z8673 Personal history of transient ischemic attack (TIA), and cerebral infarction without residual deficits: Secondary | ICD-10-CM | POA: Diagnosis not present

## 2021-10-28 DIAGNOSIS — I10 Essential (primary) hypertension: Secondary | ICD-10-CM | POA: Diagnosis not present

## 2021-10-28 DIAGNOSIS — E559 Vitamin D deficiency, unspecified: Secondary | ICD-10-CM | POA: Diagnosis not present

## 2021-10-28 DIAGNOSIS — I4891 Unspecified atrial fibrillation: Secondary | ICD-10-CM | POA: Diagnosis not present

## 2021-10-28 DIAGNOSIS — E876 Hypokalemia: Secondary | ICD-10-CM | POA: Diagnosis not present

## 2021-10-28 DIAGNOSIS — R7303 Prediabetes: Secondary | ICD-10-CM | POA: Diagnosis not present

## 2021-10-28 DIAGNOSIS — E039 Hypothyroidism, unspecified: Secondary | ICD-10-CM | POA: Diagnosis not present

## 2021-10-28 DIAGNOSIS — E782 Mixed hyperlipidemia: Secondary | ICD-10-CM | POA: Diagnosis not present

## 2021-10-28 DIAGNOSIS — D649 Anemia, unspecified: Secondary | ICD-10-CM | POA: Diagnosis not present

## 2021-11-03 ENCOUNTER — Other Ambulatory Visit: Payer: Self-pay

## 2021-11-03 ENCOUNTER — Ambulatory Visit (HOSPITAL_COMMUNITY): Payer: Medicare PPO | Attending: Cardiology

## 2021-11-03 DIAGNOSIS — E782 Mixed hyperlipidemia: Secondary | ICD-10-CM | POA: Insufficient documentation

## 2021-11-03 DIAGNOSIS — R0609 Other forms of dyspnea: Secondary | ICD-10-CM | POA: Insufficient documentation

## 2021-11-03 DIAGNOSIS — I480 Paroxysmal atrial fibrillation: Secondary | ICD-10-CM | POA: Diagnosis not present

## 2021-11-03 DIAGNOSIS — R0789 Other chest pain: Secondary | ICD-10-CM | POA: Diagnosis not present

## 2021-11-03 DIAGNOSIS — I10 Essential (primary) hypertension: Secondary | ICD-10-CM | POA: Diagnosis not present

## 2021-11-03 DIAGNOSIS — R0602 Shortness of breath: Secondary | ICD-10-CM | POA: Diagnosis not present

## 2021-11-03 DIAGNOSIS — R079 Chest pain, unspecified: Secondary | ICD-10-CM | POA: Insufficient documentation

## 2021-11-03 LAB — MYOCARDIAL PERFUSION IMAGING
LV dias vol: 76 mL (ref 46–106)
LV sys vol: 32 mL
Nuc Stress EF: 58 %
Peak HR: 86 {beats}/min
Rest HR: 56 {beats}/min
Rest Nuclear Isotope Dose: 10.6 mCi
SDS: 2
SRS: 3
SSS: 5
ST Depression (mm): 0 mm
Stress Nuclear Isotope Dose: 32.8 mCi
TID: 1.08

## 2021-11-03 MED ORDER — TECHNETIUM TC 99M TETROFOSMIN IV KIT
10.6000 | PACK | Freq: Once | INTRAVENOUS | Status: AC | PRN
  Administered 2021-11-03: 10.6 via INTRAVENOUS
  Filled 2021-11-03: qty 11

## 2021-11-03 MED ORDER — TECHNETIUM TC 99M TETROFOSMIN IV KIT
32.8000 | PACK | Freq: Once | INTRAVENOUS | Status: AC | PRN
  Administered 2021-11-03: 32.8 via INTRAVENOUS
  Filled 2021-11-03: qty 33

## 2021-11-03 MED ORDER — REGADENOSON 0.4 MG/5ML IV SOLN
0.4000 mg | Freq: Once | INTRAVENOUS | Status: AC
  Administered 2021-11-03: 0.4 mg via INTRAVENOUS

## 2021-11-09 DIAGNOSIS — J189 Pneumonia, unspecified organism: Secondary | ICD-10-CM | POA: Diagnosis not present

## 2021-11-13 NOTE — Progress Notes (Unsigned)
Office Visit    Patient Name: Vicki Morris Date of Encounter: 11/13/2021  Primary Care Provider:  Deland Pretty, MD Primary Cardiologist:  Lavone Barrientes Martinique, MD  Chief Complaint    86 year old female with a history of paroxysmal atrial fibrillation on Xarelto, atypical chest pain, dyspnea on exertion, hypertension, hyperlipidemia, TIA, hypothyroidism, anemia, and GERD who presents for follow-up related to atrial fibrillation and dyspnea on exertion.  Past Medical History    Past Medical History:  Diagnosis Date   Allergic rhinitis, cause unspecified    Anxiety    Arthritis    Chronic anticoagulation    Depression    Dysrhythmia    afib    GERD (gastroesophageal reflux disease)    History of echocardiogram    Echo 7/22: EF 55-60, no RWMA, mild asymmetric LVH, normal RVSF, mild LAE, trivial MR, mild AV sclerosis without stenosis   History of TIAs    Hyperlipidemia    Hypertension    Hypothyroidism    Normal nuclear stress test 12/2011   EF 64%, clinically and electrically negative for ischemia. Myoview scan with apical thinning, could not exclude small subendocardial scar. No ischemia. // Myoview 7/22: EF 58, no ischemia, low risk   Obesity    PAF (paroxysmal atrial fibrillation) (HCC)    Peripheral vascular disease (HCC)    Pneumonia    hx of    Polyp of nasal cavity    PONV (postoperative nausea and vomiting)    Stroke ([place])    mini stroke  years ago    Urinary tract infection    hx of    Past Surgical History:  Procedure Laterality Date   BLADDER SURGERY     CARPAL TUNNEL RELEASE     KNEE ARTHROSCOPY Left 09/14/2018   Procedure: PARITAL MEDIAL, PARTIAL LATERAL MENISCECTOMY, PATELLA FEMORAL SUBCHONDROPLASTY, TIBIAL PLATEAU KNEE ARTHROSCOPY;  Surgeon: Dorna Leitz, MD;  Location: [facility];  Service: Orthopedics;  Laterality: Left;   NASAL SINUS SURGERY     right little toe surgery      TOTAL ABDOMINAL HYSTERECTOMY W/ BILATERAL SALPINGOOPHORECTOMY     US  ECHOCARDIOGRAPHY  01/23/2010   EF 55-60%    Allergies  Allergies  Allergen Reactions   Other Anaphylaxis and Other (See Comments)    Red meat, Pork, Beef, Lamb   Beef-Derived Products    Contrast Media [Iodinated Contrast Media] Hives   Sulfonamide Derivatives Other (See Comments)    Unknown   Clarithromycin Nausea And Vomiting   Nabumetone Rash    History of Present Illness    86 year old female with the above past medical history including paroxysmal atrial fibrillation on Xarelto, atypical chest pain, dyspnea on exertion, hypertension, hyperlipidemia, TIA, hypothyroidism, anemia, and GERD.   She has a history of atypical chest pain. Myoview in 2013, 2015 and 2018, were considered low risk.  She has a history TIA and paroxysmal atrial fibrillation, on chronic Xarelto. She was last seen in the office on April 23, 2021 for preoperative clearance and reported increased dyspnea on exertion. Though her symptoms were thought to be exacerbated by anemia and physical deconditioning, she underwent repeat stress testing, which showed a prior apical defect, seen in 2018 Myoview, considered low risk. Echocardiogram in July 2022 showed an EF of 55 to 60%, G1 DD.      She presents today for follow-up accompanied by her daughter. She states that her PCP advised her to follow-up with cardiology for a 2 to 33-month history of right mid/upper back pain  that occurs only with exertion. She has stable chronic dyspnea on exertion, which is unchanged. She also reports an episode of midsternal chest pain that awoke her in the middle of the night that occurred approximately 2 months ago. Her blood pressure was elevated at the time 180/80.  Her symptoms resolved spontaneously.  Additionally, she had intermittent fleeting palpitations that lasted all day on 10/19/2021 with no associated symptoms. She is very frustrated that her back pain is limiting her activity and causing her to feel more anxious and depressed as she  is not able to do complete her normal daily activities.   Home Medications    Current Outpatient Medications  Medication Sig Dispense Refill   acetaminophen (TYLENOL) 500 MG tablet Take 1,000 mg by mouth every 6 (six) hours as needed for moderate pain or headache.     Calcium Carb-Cholecalciferol 600-10 MG-MCG TABS Take 1 tablet by mouth daily.     clobetasol ointment (TEMOVATE) [id number] % Apply 1 application topically at bedtime as needed.     denosumab (PROLIA) 60 MG/ML SOSY injection Inject 60 mg into the skin every 6 (six) months.     diltiazem (CARDIZEM CD) 240 MG 24 hr capsule Take 240 mg by mouth at bedtime.      diphenhydrAMINE (BENADRYL) 25 mg capsule Take 25 mg by mouth daily as needed for allergies.      EPINEPHrine 0.3 mg/0.3 mL IJ SOAJ injection Inject 0.3 mg into the muscle as needed for anaphylaxis.     ferrous sulfate 325 (65 FE) MG EC tablet Take 325 mg by mouth daily with breakfast.     LORazepam (ATIVAN) 0.5 MG tablet Take 0.5 mg by mouth at bedtime as needed for sleep.      nitrofurantoin, macrocrystal-monohydrate, (MACROBID) 100 MG capsule Take 100 mg by mouth daily. Continuously     nitroGLYCERIN (NITROSTAT) 0.4 MG SL tablet Place 1 tablet (0.4 mg total) under the tongue every 5 (five) minutes as needed for chest pain. 30 tablet 0   olmesartan-hydrochlorothiazide (BENICAR HCT) 20-12.5 MG tablet Take 1 tablet by mouth daily.  0   omeprazole (PRILOSEC) 20 MG capsule Take 20 mg by mouth daily.     polyethylene glycol (MIRALAX / GLYCOLAX) 17 g packet Take 17 g by mouth daily as needed for mild constipation.     Potassium Chloride ER 20 MEQ TBCR Take 1 tablet by mouth every other day.     rivaroxaban (XARELTO) 20 MG TABS tablet Take 20 mg by mouth daily with supper.     Semaglutide,0.25 or 0.5MG /DOS, (OZEMPIC, 0.25 OR 0.5 MG/DOSE,) 2 MG/1.5ML SOPN Inject 0.25 mg into the skin every Sunday.     sertraline (ZOLOFT) 50 MG tablet Take 50 mg by mouth at bedtime.      sucralfate  (CARAFATE) 1 g tablet Take 1 tablet (1 g total) by mouth 4 (four) times daily -  with meals and at bedtime. 20 tablet 0   SYNTHROID 100 MCG tablet Take 100 mcg by mouth daily before breakfast.   0   No current facility-administered medications for this visit.     Review of Systems    She denies chest pain, palpitations, dyspnea, pnd, orthopnea, n, v, dizziness, syncope, edema, weight gain, or early satiety. All other systems reviewed and are otherwise negative except as noted above.   Physical Exam    VS:  There were no vitals taken for this visit.  GEN: Well nourished, well developed, in no acute distress. HEENT: normal. Neck:  Supple, no JVD, carotid bruits, or masses. Cardiac: RRR, no murmurs, rubs, or gallops. No clubbing, cyanosis, edema.  Radials/DP/PT 2+ and equal bilaterally.  Respiratory:  Respirations regular and unlabored, clear to auscultation bilaterally. GI: Soft, nontender, nondistended, BS + x 4. MS: no deformity or atrophy. Skin: warm and dry, no rash. Neuro:  Strength and sensation are intact. Psych: Normal affect.  Accessory Clinical Findings    ECG personally reviewed by me today -Sinus bradycardia, 59 bpm, non-specific t-wave abnormality - no acute changes.  Lab Results  Component Value Date   WBC 6.8 10/25/2021   HGB 11.1 (L) 10/25/2021   HCT 34.6 (L) 10/25/2021   MCV 85.9 10/25/2021   PLT 274 10/25/2021   Lab Results  Component Value Date   CREATININE 1.07 (H) 10/25/2021   BUN 21 10/25/2021   NA 139 10/25/2021   K 3.2 (L) 10/25/2021   CL 103 10/25/2021   CO2 25 10/25/2021   No results found for: ALT, AST, GGT, ALKPHOS, BILITOT No results found for: CHOL, HDL, LDLCALC, LDLDIRECT, TRIG, CHOLHDL  No results found for: HGBA1C  Echo 04/28/21: IMPRESSIONS     1. Left ventricular ejection fraction, by estimation, is 55 to 60%. The  left ventricle has normal function. The left ventricle has no regional  wall motion abnormalities. Left ventricular  diastolic parameters are  consistent with Grade I diastolic  dysfunction (impaired relaxation).   2. Right ventricular systolic function is normal. The right ventricular  size is normal.   3. Trivial mitral valve regurgitation.   Myoview 11/03/21: Study Highlights      The study is normal. The study is low risk.   No ST deviation was noted.   Left ventricular function is normal. Nuclear stress EF: 58 %. The left ventricular ejection fraction is normal (55-65%). End diastolic cavity size is normal. End systolic cavity size is normal.   Prior study available for comparison from 04/28/2021.   Normal stress nuclear study with mild apical thinning but no ischemia.  Gated ejection fraction 58% with normal wall motion.  Assessment & Plan    1. Atypical chest pain/dyspnea on exertion: Myoview in 2013, 2015 and 2018, and 2022 were considered low risk. Echocardiogram in July 2022 showed an EF of 55 to 60%, G1 DD.  Repeat Myoview in Jan 2023 was unremarkable.   2. Paroxysmal atrial fibrillation: She did have an episode of intermittent fleeting palpitations that lasted all day on 10/19/2021 with no associated symptoms.  EKG today shows normal sinus rhythm, 59 bpm.  Denies bleeding.  I advised her to notify us should her palpitations become more persistent or severe.  For now, continue diltiazem, Xarelto.  3. Hypertension: Isolated episode of elevated blood pressure in the setting of sudden onset midsternal chest pain as above.  Otherwise, her BP well controlled. Continue current antihypertensive regimen.   4. Hyperlipidemia: No recent lipid panel on file.  She reports not taking her rosuvastatin.  Unfortunately, we did not have time to discuss this further at today's visit.  For now, continue Zetia and we can revisit the subject of her cholesterol management at her follow-up appointment.  5. Disposition: Follow-up with Dr. Martinique as previously scheduled in February 2023.  Vallie Fayette Martinique, NP 11/13/2021,  7:31 AM

## 2021-11-14 DIAGNOSIS — J40 Bronchitis, not specified as acute or chronic: Secondary | ICD-10-CM | POA: Diagnosis not present

## 2021-11-17 ENCOUNTER — Ambulatory Visit: Payer: Medicare PPO | Admitting: Cardiology

## 2021-12-08 DIAGNOSIS — K5900 Constipation, unspecified: Secondary | ICD-10-CM | POA: Diagnosis not present

## 2021-12-08 DIAGNOSIS — K219 Gastro-esophageal reflux disease without esophagitis: Secondary | ICD-10-CM | POA: Diagnosis not present

## 2021-12-17 DIAGNOSIS — R7989 Other specified abnormal findings of blood chemistry: Secondary | ICD-10-CM | POA: Diagnosis not present

## 2021-12-17 DIAGNOSIS — E782 Mixed hyperlipidemia: Secondary | ICD-10-CM | POA: Diagnosis not present

## 2021-12-30 NOTE — Progress Notes (Signed)
?  ?Cardiology Office Note ? ? ?Date:  01/05/2022  ? ?ID:  Vicki Morris, DOB July 06, 1936, MRN [id number] ? ?PCP:  Deland Pretty, MD  ?Cardiologist:   Oryon Gary Martinique, MD  ? ?Chief Complaint  ?Patient presents with  ? Atrial Fibrillation  ? ? ?  ?History of Present Illness: ?Vicki Morris is a 86 y.o. female who presents for follow up Afib and chest pain. She has a PMH of HTN, HLD, hypothyroidism, PAF on chronic Coumadin therapy, GERD and a history of TIA.  Myoview and echo performed in June 2015 were unremarkable.   She was seen in the emergency room in December 2018 for chest pain.  She was admitted briefly.  Serial troponin was negative.  Potassium on arrival was low, however this was repleted.  Echocardiogram obtained on 09/21/2017 showed EF 55 to 60%, grade 1 DD, trivial mitral regurgitation, trivial aortic regurgitation, moderately dilated left atrium.  She was seen in the hospital by cardiology service, chest pain was felt to be very atypical.  Outpatient stress test was recommended.  She had Myoview performed on 09/30/2017 which showed EF 51%, small defect of mild severity in the basal inferolateral location which is partially reversible in the likely related to variation in the diaphragmatic attenuation artifact.  There was small defect of moderate severity in the apex location which is nonreversible consistent with attenuation artifact as well, no obvious ischemia was noted.  Overall this is considered a low risk study.  ?  ?In December 2019 she had left knee surgery without complications. On follow up today she is still somewhat deconditioned. She enjoys working in her flowers. Denies any real episodes of Afib but occasionally will note a skipped beat.  ? ?She was seen in 2022 for clearance for GI procedure. Myoview was low risk and Echo was normal. In Jan 2023 seen by Diona Browner NP for atypical chest pain. Really pain is localized to medial to the right scapula. Myoview again done and was normal. She  states this is helped with Tylenol and rest but limits her activity and being able to play the piano.  ? ? ? ?Past Medical History:  ?Diagnosis Date  ? Allergic rhinitis, cause unspecified   ? Anxiety   ? Arthritis   ? Chronic anticoagulation   ? Depression   ? Dysrhythmia   ? afib   ? GERD (gastroesophageal reflux disease)   ? History of echocardiogram   ? Echo 7/22: EF 55-60, no RWMA, mild asymmetric LVH, normal RVSF, mild LAE, trivial MR, mild AV sclerosis without stenosis  ? History of TIAs   ? Hyperlipidemia   ? Hypertension   ? Hypothyroidism   ? Normal nuclear stress test 12/2011  ? EF 64%, clinically and electrically negative for ischemia. Myoview scan with apical thinning, could not exclude small subendocardial scar. No ischemia. // Myoview 7/22: EF 58, no ischemia, low risk  ? Obesity   ? PAF (paroxysmal atrial fibrillation) ([place])   ? Peripheral vascular disease ([place])   ? Pneumonia   ? hx of   ? Polyp of nasal cavity   ? PONV (postoperative nausea and vomiting)   ? Stroke [facility])   ? mini stroke  years ago   ? Urinary tract infection   ? hx of   ? ? ?Past Surgical History:  ?Procedure Laterality Date  ? BLADDER SURGERY    ? CARPAL TUNNEL RELEASE    ? KNEE ARTHROSCOPY Left 09/14/2018  ? Procedure: PARITAL MEDIAL, PARTIAL  LATERAL MENISCECTOMY, PATELLA FEMORAL SUBCHONDROPLASTY, TIBIAL PLATEAU KNEE ARTHROSCOPY;  Surgeon: Dorna Leitz, MD;  Location: [facility];  Service: Orthopedics;  Laterality: Left;  ? NASAL SINUS SURGERY    ? right little toe surgery     ? TOTAL ABDOMINAL HYSTERECTOMY W/ BILATERAL SALPINGOOPHORECTOMY    ? US ECHOCARDIOGRAPHY  01/23/2010  ? EF 55-60%  ? ? ? ?Current Outpatient Medications  ?Medication Sig Dispense Refill  ? acetaminophen (TYLENOL) 500 MG tablet Take 1,000 mg by mouth every 6 (six) hours as needed for moderate pain or headache.    ? Calcium Carb-Cholecalciferol 600-10 MG-MCG TABS Take 1 tablet by mouth daily.    ? clobetasol ointment (TEMOVATE) [id number] % Apply 1 application topically  at bedtime as needed.    ? denosumab (PROLIA) 60 MG/ML SOSY injection Inject 60 mg into the skin every 6 (six) months.    ? diltiazem (CARDIZEM CD) 240 MG 24 hr capsule Take 240 mg by mouth at bedtime.     ? diphenhydrAMINE (BENADRYL) 25 mg capsule Take 25 mg by mouth daily as needed for allergies.     ? EPINEPHrine 0.3 mg/0.3 mL IJ SOAJ injection Inject 0.3 mg into the muscle as needed for anaphylaxis.    ? ezetimibe (ZETIA) 10 MG tablet Take 1 tablet (10 mg total) by mouth daily. 90 tablet 3  ? ferrous sulfate 325 (65 FE) MG EC tablet Take 325 mg by mouth daily with breakfast.    ? LORazepam (ATIVAN) 0.5 MG tablet Take 0.5 mg by mouth at bedtime as needed for sleep.     ? nitrofurantoin, macrocrystal-monohydrate, (MACROBID) 100 MG capsule Take 100 mg by mouth daily. Continuously    ? nitroGLYCERIN (NITROSTAT) 0.4 MG SL tablet Place 1 tablet (0.4 mg total) under the tongue every 5 (five) minutes as needed for chest pain. 30 tablet 0  ? olmesartan-hydrochlorothiazide (BENICAR HCT) 20-12.5 MG tablet Take 1 tablet by mouth daily.  0  ? omeprazole (PRILOSEC) 20 MG capsule Take 20 mg by mouth daily.    ? polyethylene glycol (MIRALAX / GLYCOLAX) 17 g packet Take 17 g by mouth daily as needed for mild constipation.    ? Potassium Chloride ER 20 MEQ TBCR Take 1 tablet by mouth every other day.    ? rivaroxaban (XARELTO) 20 MG TABS tablet Take 20 mg by mouth daily with supper.    ? rosuvastatin (CRESTOR) 20 MG tablet Take 1 tablet (20 mg total) by mouth daily. 90 tablet 3  ? sertraline (ZOLOFT) 50 MG tablet Take 50 mg by mouth at bedtime.     ? SYNTHROID 100 MCG tablet Take 100 mcg by mouth daily before breakfast.   0  ? VITAMIN D PO Take by mouth daily.    ? ?No current facility-administered medications for this visit.  ? ? ?Allergies:   Other, Beef-derived products, Contrast media [iodinated contrast media], Meat extract, Sulfonamide derivatives, Clarithromycin, and Nabumetone  ? ? ?Social History:  The patient  reports  that she has never smoked. She has never used smokeless tobacco. She reports that she does not drink alcohol and does not use drugs.  ? ?Family History:  The patient's family history includes Breast cancer in her paternal aunt; Heart attack in her father; Heart disease in her father and mother.  ? ? ?ROS:  Please see the history of present illness.   Otherwise, review of systems are positive for none.   All other systems are reviewed and negative.  ? ? ?PHYSICAL EXAM: ?  VS:  BP 128/76   Pulse 60   Ht 5\' 4"  (1.626 m)   Wt 196 lb 3.2 oz (89 kg)   SpO2 95%   BMI 33.68 kg/m?  , BMI Body mass index is 33.68 kg/m?. ?GEN: Well nourished, well developed, in no acute distress ?HEENT: normal ?Neck: no JVD, carotid bruits, or masses ?Cardiac: RRR; no murmurs, rubs, or gallops,no edema  ?Respiratory:  clear to auscultation bilaterally, normal work of breathing ?GI: soft, nontender, nondistended, + BS ?MS: no deformity or atrophy ?Skin: warm and dry, no rash ?Neuro:  Strength and sensation are intact ?Psych: euthymic mood, full affect ? ? ?EKG:  EKG is not ordered today. ?The ekg ordered today demonstrates N/A ? ? ?Recent Labs: ?10/25/2021: BUN 21; Creatinine, Ser 1.07; Hemoglobin 11.1; Platelets 274; Potassium 3.2; Sodium 139  ? ?Dated 04/18/21: LFTs normal ?Dated 12/17/21: cholesterol 172, triglycerides 98, HDL 50. Non HDL 122.  ?Dated 1.24.23 normal TSH ? ?Lipid Panel ?No results found for: CHOL, TRIG, HDL, CHOLHDL, VLDL, LDLCALC, LDLDIRECT ?  ? ?Wt Readings from Last 3 Encounters:  ?01/05/22 196 lb 3.2 oz (89 kg)  ?10/25/21 193 lb (87.5 kg)  ?10/24/21 193 lb 12.8 oz (87.9 kg)  ?  ? ? ?Other studies Reviewed: ?Additional studies/ records that were reviewed today include:  ? ?Myoview 04/28/21: Study Highlights ? ?  ?The left ventricular ejection fraction is normal (55-65%). ?Nuclear stress EF: 58%. ?No T wave inversion was noted during stress. ?There was no ST segment deviation noted during stress. ?Defect 1: There is a small  defect of moderate severity present in the apex location. ?This is a low risk study. ?  ?Small size, moderate intensity fixed apical perfusion defect, favor artifact or less likely scar. No reversible ischemia

## 2022-01-05 ENCOUNTER — Ambulatory Visit: Payer: Medicare PPO | Admitting: Cardiology

## 2022-01-05 ENCOUNTER — Encounter: Payer: Self-pay | Admitting: Cardiology

## 2022-01-05 VITALS — BP 128/76 | HR 60 | Ht 64.0 in | Wt 196.2 lb

## 2022-01-05 DIAGNOSIS — I10 Essential (primary) hypertension: Secondary | ICD-10-CM | POA: Diagnosis not present

## 2022-01-05 DIAGNOSIS — I480 Paroxysmal atrial fibrillation: Secondary | ICD-10-CM

## 2022-01-05 MED ORDER — EZETIMIBE 10 MG PO TABS: 10.0000 mg | ORAL_TABLET | Freq: Every day | ORAL | 3 refills | Status: DC

## 2022-01-05 MED ORDER — ROSUVASTATIN CALCIUM 20 MG PO TABS: 20.0000 mg | ORAL_TABLET | Freq: Every day | ORAL | 3 refills | Status: DC

## 2022-02-24 DIAGNOSIS — M810 Age-related osteoporosis without current pathological fracture: Secondary | ICD-10-CM | POA: Diagnosis not present

## 2022-03-05 DIAGNOSIS — M5451 Vertebrogenic low back pain: Secondary | ICD-10-CM | POA: Diagnosis not present

## 2022-03-05 DIAGNOSIS — M25551 Pain in right hip: Secondary | ICD-10-CM | POA: Diagnosis not present

## 2022-03-05 DIAGNOSIS — M25552 Pain in left hip: Secondary | ICD-10-CM | POA: Diagnosis not present

## 2022-03-05 DIAGNOSIS — M25562 Pain in left knee: Secondary | ICD-10-CM | POA: Diagnosis not present

## 2022-03-09 DIAGNOSIS — M545 Low back pain, unspecified: Secondary | ICD-10-CM | POA: Diagnosis not present

## 2022-03-09 DIAGNOSIS — M25552 Pain in left hip: Secondary | ICD-10-CM | POA: Diagnosis not present

## 2022-03-09 DIAGNOSIS — M6281 Muscle weakness (generalized): Secondary | ICD-10-CM | POA: Diagnosis not present

## 2022-03-09 DIAGNOSIS — M25551 Pain in right hip: Secondary | ICD-10-CM | POA: Diagnosis not present

## 2022-03-09 DIAGNOSIS — M2560 Stiffness of unspecified joint, not elsewhere classified: Secondary | ICD-10-CM | POA: Diagnosis not present

## 2022-03-11 DIAGNOSIS — M25551 Pain in right hip: Secondary | ICD-10-CM | POA: Diagnosis not present

## 2022-03-11 DIAGNOSIS — M6281 Muscle weakness (generalized): Secondary | ICD-10-CM | POA: Diagnosis not present

## 2022-03-11 DIAGNOSIS — M545 Low back pain, unspecified: Secondary | ICD-10-CM | POA: Diagnosis not present

## 2022-03-11 DIAGNOSIS — M25552 Pain in left hip: Secondary | ICD-10-CM | POA: Diagnosis not present

## 2022-03-11 DIAGNOSIS — M2560 Stiffness of unspecified joint, not elsewhere classified: Secondary | ICD-10-CM | POA: Diagnosis not present

## 2022-03-18 DIAGNOSIS — R7303 Prediabetes: Secondary | ICD-10-CM | POA: Diagnosis not present

## 2022-03-18 DIAGNOSIS — R062 Wheezing: Secondary | ICD-10-CM | POA: Diagnosis not present

## 2022-03-18 DIAGNOSIS — I10 Essential (primary) hypertension: Secondary | ICD-10-CM | POA: Diagnosis not present

## 2022-03-18 DIAGNOSIS — E7801 Familial hypercholesterolemia: Secondary | ICD-10-CM | POA: Diagnosis not present

## 2022-03-18 DIAGNOSIS — R051 Acute cough: Secondary | ICD-10-CM | POA: Diagnosis not present

## 2022-03-18 DIAGNOSIS — R0789 Other chest pain: Secondary | ICD-10-CM | POA: Diagnosis not present

## 2022-03-23 DIAGNOSIS — I4891 Unspecified atrial fibrillation: Secondary | ICD-10-CM | POA: Diagnosis not present

## 2022-03-23 DIAGNOSIS — D509 Iron deficiency anemia, unspecified: Secondary | ICD-10-CM | POA: Diagnosis not present

## 2022-03-23 DIAGNOSIS — K219 Gastro-esophageal reflux disease without esophagitis: Secondary | ICD-10-CM | POA: Diagnosis not present

## 2022-03-23 DIAGNOSIS — E782 Mixed hyperlipidemia: Secondary | ICD-10-CM | POA: Diagnosis not present

## 2022-03-23 DIAGNOSIS — Z7901 Long term (current) use of anticoagulants: Secondary | ICD-10-CM | POA: Diagnosis not present

## 2022-03-23 DIAGNOSIS — I10 Essential (primary) hypertension: Secondary | ICD-10-CM | POA: Diagnosis not present

## 2022-03-23 DIAGNOSIS — Z0000 Encounter for general adult medical examination without abnormal findings: Secondary | ICD-10-CM | POA: Diagnosis not present

## 2022-03-23 DIAGNOSIS — E039 Hypothyroidism, unspecified: Secondary | ICD-10-CM | POA: Diagnosis not present

## 2022-03-23 DIAGNOSIS — Z8673 Personal history of transient ischemic attack (TIA), and cerebral infarction without residual deficits: Secondary | ICD-10-CM | POA: Diagnosis not present

## 2022-04-01 DIAGNOSIS — R1110 Vomiting, unspecified: Secondary | ICD-10-CM | POA: Diagnosis not present

## 2022-04-01 DIAGNOSIS — I951 Orthostatic hypotension: Secondary | ICD-10-CM | POA: Diagnosis not present

## 2022-04-01 DIAGNOSIS — E876 Hypokalemia: Secondary | ICD-10-CM | POA: Diagnosis not present

## 2022-04-13 DIAGNOSIS — M170 Bilateral primary osteoarthritis of knee: Secondary | ICD-10-CM | POA: Diagnosis not present

## 2022-04-13 DIAGNOSIS — M7918 Myalgia, other site: Secondary | ICD-10-CM | POA: Diagnosis not present

## 2022-04-13 DIAGNOSIS — M25552 Pain in left hip: Secondary | ICD-10-CM | POA: Diagnosis not present

## 2022-04-13 DIAGNOSIS — M25551 Pain in right hip: Secondary | ICD-10-CM | POA: Diagnosis not present

## 2022-04-15 DIAGNOSIS — E876 Hypokalemia: Secondary | ICD-10-CM | POA: Diagnosis not present

## 2022-04-15 DIAGNOSIS — I10 Essential (primary) hypertension: Secondary | ICD-10-CM | POA: Diagnosis not present

## 2022-04-15 DIAGNOSIS — N1831 Chronic kidney disease, stage 3a: Secondary | ICD-10-CM | POA: Diagnosis not present

## 2022-04-15 DIAGNOSIS — R1110 Vomiting, unspecified: Secondary | ICD-10-CM | POA: Diagnosis not present

## 2022-04-15 DIAGNOSIS — Z7901 Long term (current) use of anticoagulants: Secondary | ICD-10-CM | POA: Diagnosis not present

## 2022-04-15 DIAGNOSIS — E1122 Type 2 diabetes mellitus with diabetic chronic kidney disease: Secondary | ICD-10-CM | POA: Diagnosis not present

## 2022-04-15 DIAGNOSIS — Z23 Encounter for immunization: Secondary | ICD-10-CM | POA: Diagnosis not present

## 2022-04-15 DIAGNOSIS — E559 Vitamin D deficiency, unspecified: Secondary | ICD-10-CM | POA: Diagnosis not present

## 2022-04-15 DIAGNOSIS — I4891 Unspecified atrial fibrillation: Secondary | ICD-10-CM | POA: Diagnosis not present

## 2022-04-20 DIAGNOSIS — M25551 Pain in right hip: Secondary | ICD-10-CM | POA: Diagnosis not present

## 2022-04-23 DIAGNOSIS — M25552 Pain in left hip: Secondary | ICD-10-CM | POA: Diagnosis not present

## 2022-04-23 DIAGNOSIS — M6281 Muscle weakness (generalized): Secondary | ICD-10-CM | POA: Diagnosis not present

## 2022-04-23 DIAGNOSIS — M25551 Pain in right hip: Secondary | ICD-10-CM | POA: Diagnosis not present

## 2022-04-23 DIAGNOSIS — M2560 Stiffness of unspecified joint, not elsewhere classified: Secondary | ICD-10-CM | POA: Diagnosis not present

## 2022-04-23 DIAGNOSIS — M545 Low back pain, unspecified: Secondary | ICD-10-CM | POA: Diagnosis not present

## 2022-04-27 DIAGNOSIS — M25552 Pain in left hip: Secondary | ICD-10-CM | POA: Diagnosis not present

## 2022-04-27 DIAGNOSIS — M6281 Muscle weakness (generalized): Secondary | ICD-10-CM | POA: Diagnosis not present

## 2022-04-27 DIAGNOSIS — M2560 Stiffness of unspecified joint, not elsewhere classified: Secondary | ICD-10-CM | POA: Diagnosis not present

## 2022-04-27 DIAGNOSIS — M545 Low back pain, unspecified: Secondary | ICD-10-CM | POA: Diagnosis not present

## 2022-04-27 DIAGNOSIS — M25551 Pain in right hip: Secondary | ICD-10-CM | POA: Diagnosis not present

## 2022-05-12 DIAGNOSIS — M545 Low back pain, unspecified: Secondary | ICD-10-CM | POA: Diagnosis not present

## 2022-05-12 DIAGNOSIS — M6281 Muscle weakness (generalized): Secondary | ICD-10-CM | POA: Diagnosis not present

## 2022-05-12 DIAGNOSIS — M2560 Stiffness of unspecified joint, not elsewhere classified: Secondary | ICD-10-CM | POA: Diagnosis not present

## 2022-05-12 DIAGNOSIS — M25551 Pain in right hip: Secondary | ICD-10-CM | POA: Diagnosis not present

## 2022-05-12 DIAGNOSIS — M25552 Pain in left hip: Secondary | ICD-10-CM | POA: Diagnosis not present

## 2022-05-14 DIAGNOSIS — M545 Low back pain, unspecified: Secondary | ICD-10-CM | POA: Diagnosis not present

## 2022-05-14 DIAGNOSIS — M25552 Pain in left hip: Secondary | ICD-10-CM | POA: Diagnosis not present

## 2022-05-14 DIAGNOSIS — M2560 Stiffness of unspecified joint, not elsewhere classified: Secondary | ICD-10-CM | POA: Diagnosis not present

## 2022-05-14 DIAGNOSIS — M25551 Pain in right hip: Secondary | ICD-10-CM | POA: Diagnosis not present

## 2022-05-14 DIAGNOSIS — M6281 Muscle weakness (generalized): Secondary | ICD-10-CM | POA: Diagnosis not present

## 2022-05-21 DIAGNOSIS — M2560 Stiffness of unspecified joint, not elsewhere classified: Secondary | ICD-10-CM | POA: Diagnosis not present

## 2022-05-21 DIAGNOSIS — M545 Low back pain, unspecified: Secondary | ICD-10-CM | POA: Diagnosis not present

## 2022-05-21 DIAGNOSIS — M25552 Pain in left hip: Secondary | ICD-10-CM | POA: Diagnosis not present

## 2022-05-21 DIAGNOSIS — M6281 Muscle weakness (generalized): Secondary | ICD-10-CM | POA: Diagnosis not present

## 2022-05-21 DIAGNOSIS — M25551 Pain in right hip: Secondary | ICD-10-CM | POA: Diagnosis not present

## 2022-05-25 DIAGNOSIS — M5441 Lumbago with sciatica, right side: Secondary | ICD-10-CM | POA: Diagnosis not present

## 2022-05-25 DIAGNOSIS — M25551 Pain in right hip: Secondary | ICD-10-CM | POA: Diagnosis not present

## 2022-05-28 DIAGNOSIS — M25552 Pain in left hip: Secondary | ICD-10-CM | POA: Diagnosis not present

## 2022-05-28 DIAGNOSIS — M545 Low back pain, unspecified: Secondary | ICD-10-CM | POA: Diagnosis not present

## 2022-05-28 DIAGNOSIS — M2560 Stiffness of unspecified joint, not elsewhere classified: Secondary | ICD-10-CM | POA: Diagnosis not present

## 2022-05-28 DIAGNOSIS — M25551 Pain in right hip: Secondary | ICD-10-CM | POA: Diagnosis not present

## 2022-05-28 DIAGNOSIS — M6281 Muscle weakness (generalized): Secondary | ICD-10-CM | POA: Diagnosis not present

## 2022-05-29 DIAGNOSIS — M545 Low back pain, unspecified: Secondary | ICD-10-CM | POA: Diagnosis not present

## 2022-05-29 DIAGNOSIS — M25551 Pain in right hip: Secondary | ICD-10-CM | POA: Diagnosis not present

## 2022-06-03 DIAGNOSIS — M5451 Vertebrogenic low back pain: Secondary | ICD-10-CM | POA: Diagnosis not present

## 2022-06-03 DIAGNOSIS — M7061 Trochanteric bursitis, right hip: Secondary | ICD-10-CM | POA: Diagnosis not present

## 2022-06-03 DIAGNOSIS — M7918 Myalgia, other site: Secondary | ICD-10-CM | POA: Diagnosis not present

## 2022-06-04 DIAGNOSIS — Z1231 Encounter for screening mammogram for malignant neoplasm of breast: Secondary | ICD-10-CM | POA: Diagnosis not present

## 2022-06-04 DIAGNOSIS — Z01419 Encounter for gynecological examination (general) (routine) without abnormal findings: Secondary | ICD-10-CM | POA: Diagnosis not present

## 2022-06-04 DIAGNOSIS — Z6834 Body mass index (BMI) 34.0-34.9, adult: Secondary | ICD-10-CM | POA: Diagnosis not present

## 2022-06-04 DIAGNOSIS — L292 Pruritus vulvae: Secondary | ICD-10-CM | POA: Diagnosis not present

## 2022-06-21 DIAGNOSIS — R509 Fever, unspecified: Secondary | ICD-10-CM | POA: Diagnosis not present

## 2022-06-21 DIAGNOSIS — R051 Acute cough: Secondary | ICD-10-CM | POA: Diagnosis not present

## 2022-07-16 DIAGNOSIS — Z8673 Personal history of transient ischemic attack (TIA), and cerebral infarction without residual deficits: Secondary | ICD-10-CM | POA: Diagnosis not present

## 2022-07-16 DIAGNOSIS — R0602 Shortness of breath: Secondary | ICD-10-CM | POA: Diagnosis not present

## 2022-07-16 DIAGNOSIS — E1122 Type 2 diabetes mellitus with diabetic chronic kidney disease: Secondary | ICD-10-CM | POA: Diagnosis not present

## 2022-07-16 DIAGNOSIS — E782 Mixed hyperlipidemia: Secondary | ICD-10-CM | POA: Diagnosis not present

## 2022-07-16 DIAGNOSIS — I4891 Unspecified atrial fibrillation: Secondary | ICD-10-CM | POA: Diagnosis not present

## 2022-07-16 DIAGNOSIS — E876 Hypokalemia: Secondary | ICD-10-CM | POA: Diagnosis not present

## 2022-07-16 DIAGNOSIS — E039 Hypothyroidism, unspecified: Secondary | ICD-10-CM | POA: Diagnosis not present

## 2022-07-16 DIAGNOSIS — R531 Weakness: Secondary | ICD-10-CM | POA: Diagnosis not present

## 2022-07-16 DIAGNOSIS — E559 Vitamin D deficiency, unspecified: Secondary | ICD-10-CM | POA: Diagnosis not present

## 2022-07-22 DIAGNOSIS — E039 Hypothyroidism, unspecified: Secondary | ICD-10-CM | POA: Diagnosis not present

## 2022-07-22 DIAGNOSIS — I10 Essential (primary) hypertension: Secondary | ICD-10-CM | POA: Diagnosis not present

## 2022-07-22 DIAGNOSIS — K219 Gastro-esophageal reflux disease without esophagitis: Secondary | ICD-10-CM | POA: Diagnosis not present

## 2022-07-22 DIAGNOSIS — N1831 Chronic kidney disease, stage 3a: Secondary | ICD-10-CM | POA: Diagnosis not present

## 2022-07-22 DIAGNOSIS — I4891 Unspecified atrial fibrillation: Secondary | ICD-10-CM | POA: Diagnosis not present

## 2022-07-22 DIAGNOSIS — Z23 Encounter for immunization: Secondary | ICD-10-CM | POA: Diagnosis not present

## 2022-07-31 DIAGNOSIS — H26492 Other secondary cataract, left eye: Secondary | ICD-10-CM | POA: Diagnosis not present

## 2022-07-31 DIAGNOSIS — H04123 Dry eye syndrome of bilateral lacrimal glands: Secondary | ICD-10-CM | POA: Diagnosis not present

## 2022-07-31 DIAGNOSIS — H353132 Nonexudative age-related macular degeneration, bilateral, intermediate dry stage: Secondary | ICD-10-CM | POA: Diagnosis not present

## 2022-07-31 DIAGNOSIS — H524 Presbyopia: Secondary | ICD-10-CM | POA: Diagnosis not present

## 2022-07-31 DIAGNOSIS — H35033 Hypertensive retinopathy, bilateral: Secondary | ICD-10-CM | POA: Diagnosis not present

## 2022-08-05 DIAGNOSIS — E876 Hypokalemia: Secondary | ICD-10-CM | POA: Diagnosis not present

## 2022-08-14 DIAGNOSIS — E039 Hypothyroidism, unspecified: Secondary | ICD-10-CM | POA: Diagnosis not present

## 2022-08-14 DIAGNOSIS — R5381 Other malaise: Secondary | ICD-10-CM | POA: Diagnosis not present

## 2022-09-01 DIAGNOSIS — M810 Age-related osteoporosis without current pathological fracture: Secondary | ICD-10-CM | POA: Diagnosis not present

## 2022-09-22 DIAGNOSIS — Z23 Encounter for immunization: Secondary | ICD-10-CM | POA: Diagnosis not present

## 2022-09-22 DIAGNOSIS — E039 Hypothyroidism, unspecified: Secondary | ICD-10-CM | POA: Diagnosis not present

## 2022-09-22 DIAGNOSIS — R5381 Other malaise: Secondary | ICD-10-CM | POA: Diagnosis not present

## 2022-09-22 DIAGNOSIS — F339 Major depressive disorder, recurrent, unspecified: Secondary | ICD-10-CM | POA: Diagnosis not present

## 2022-09-22 DIAGNOSIS — E876 Hypokalemia: Secondary | ICD-10-CM | POA: Diagnosis not present

## 2022-10-21 DIAGNOSIS — M6281 Muscle weakness (generalized): Secondary | ICD-10-CM | POA: Diagnosis not present

## 2022-10-21 DIAGNOSIS — M5489 Other dorsalgia: Secondary | ICD-10-CM | POA: Diagnosis not present

## 2022-10-21 DIAGNOSIS — R2689 Other abnormalities of gait and mobility: Secondary | ICD-10-CM | POA: Diagnosis not present

## 2022-10-28 DIAGNOSIS — M6281 Muscle weakness (generalized): Secondary | ICD-10-CM | POA: Diagnosis not present

## 2022-10-28 DIAGNOSIS — R2689 Other abnormalities of gait and mobility: Secondary | ICD-10-CM | POA: Diagnosis not present

## 2022-10-28 DIAGNOSIS — M5489 Other dorsalgia: Secondary | ICD-10-CM | POA: Diagnosis not present

## 2022-11-04 DIAGNOSIS — M6281 Muscle weakness (generalized): Secondary | ICD-10-CM | POA: Diagnosis not present

## 2022-11-04 DIAGNOSIS — R2689 Other abnormalities of gait and mobility: Secondary | ICD-10-CM | POA: Diagnosis not present

## 2022-11-04 DIAGNOSIS — M5489 Other dorsalgia: Secondary | ICD-10-CM | POA: Diagnosis not present

## 2022-11-11 DIAGNOSIS — M5489 Other dorsalgia: Secondary | ICD-10-CM | POA: Diagnosis not present

## 2022-11-11 DIAGNOSIS — R2689 Other abnormalities of gait and mobility: Secondary | ICD-10-CM | POA: Diagnosis not present

## 2022-11-11 DIAGNOSIS — M6281 Muscle weakness (generalized): Secondary | ICD-10-CM | POA: Diagnosis not present

## 2022-11-18 ENCOUNTER — Telehealth: Payer: Self-pay

## 2022-11-18 NOTE — Patient Outreach (Signed)
  Care Coordination   Initial Visit Note   11/18/2022 Name: Vicki Morris MRN: [id number] DOB: [birth year]  Vicki Morris is a 87 y.o. year old female who sees Deland Pretty, MD for primary care. I spoke with  Durward Parcel by phone today.  What matters to the patients health and wellness today?  Patient hung up    Goals Addressed   None     SDOH assessments and interventions completed:  No     Care Coordination Interventions:  No, not indicated   Follow up plan: No further intervention required.   Encounter Outcome:  Pt. Refused   Jone Baseman, RN, MSN [facility] Care Management Coordinator Direct Line [phone]

## 2022-11-25 DIAGNOSIS — R2689 Other abnormalities of gait and mobility: Secondary | ICD-10-CM | POA: Diagnosis not present

## 2022-11-25 DIAGNOSIS — M6281 Muscle weakness (generalized): Secondary | ICD-10-CM | POA: Diagnosis not present

## 2022-11-25 DIAGNOSIS — M5489 Other dorsalgia: Secondary | ICD-10-CM | POA: Diagnosis not present

## 2022-12-02 DIAGNOSIS — R2689 Other abnormalities of gait and mobility: Secondary | ICD-10-CM | POA: Diagnosis not present

## 2022-12-02 DIAGNOSIS — M6281 Muscle weakness (generalized): Secondary | ICD-10-CM | POA: Diagnosis not present

## 2022-12-02 DIAGNOSIS — M5489 Other dorsalgia: Secondary | ICD-10-CM | POA: Diagnosis not present

## 2022-12-09 DIAGNOSIS — R2689 Other abnormalities of gait and mobility: Secondary | ICD-10-CM | POA: Diagnosis not present

## 2022-12-09 DIAGNOSIS — M6281 Muscle weakness (generalized): Secondary | ICD-10-CM | POA: Diagnosis not present

## 2022-12-09 DIAGNOSIS — L309 Dermatitis, unspecified: Secondary | ICD-10-CM | POA: Diagnosis not present

## 2022-12-09 DIAGNOSIS — F339 Major depressive disorder, recurrent, unspecified: Secondary | ICD-10-CM | POA: Diagnosis not present

## 2022-12-09 DIAGNOSIS — M5489 Other dorsalgia: Secondary | ICD-10-CM | POA: Diagnosis not present

## 2022-12-09 DIAGNOSIS — R5381 Other malaise: Secondary | ICD-10-CM | POA: Diagnosis not present

## 2022-12-16 DIAGNOSIS — M6281 Muscle weakness (generalized): Secondary | ICD-10-CM | POA: Diagnosis not present

## 2022-12-16 DIAGNOSIS — M5489 Other dorsalgia: Secondary | ICD-10-CM | POA: Diagnosis not present

## 2022-12-16 DIAGNOSIS — R2689 Other abnormalities of gait and mobility: Secondary | ICD-10-CM | POA: Diagnosis not present

## 2023-01-06 DIAGNOSIS — M5489 Other dorsalgia: Secondary | ICD-10-CM | POA: Diagnosis not present

## 2023-01-06 DIAGNOSIS — M6281 Muscle weakness (generalized): Secondary | ICD-10-CM | POA: Diagnosis not present

## 2023-01-06 DIAGNOSIS — R2689 Other abnormalities of gait and mobility: Secondary | ICD-10-CM | POA: Diagnosis not present

## 2023-01-13 DIAGNOSIS — R2689 Other abnormalities of gait and mobility: Secondary | ICD-10-CM | POA: Diagnosis not present

## 2023-01-13 DIAGNOSIS — M5489 Other dorsalgia: Secondary | ICD-10-CM | POA: Diagnosis not present

## 2023-01-13 DIAGNOSIS — M6281 Muscle weakness (generalized): Secondary | ICD-10-CM | POA: Diagnosis not present

## 2023-01-18 ENCOUNTER — Other Ambulatory Visit: Payer: Self-pay | Admitting: Cardiology

## 2023-01-18 DIAGNOSIS — M170 Bilateral primary osteoarthritis of knee: Secondary | ICD-10-CM | POA: Diagnosis not present

## 2023-01-18 DIAGNOSIS — M1711 Unilateral primary osteoarthritis, right knee: Secondary | ICD-10-CM | POA: Diagnosis not present

## 2023-01-18 DIAGNOSIS — M1712 Unilateral primary osteoarthritis, left knee: Secondary | ICD-10-CM | POA: Diagnosis not present

## 2023-01-19 DIAGNOSIS — N3020 Other chronic cystitis without hematuria: Secondary | ICD-10-CM | POA: Diagnosis not present

## 2023-02-10 DIAGNOSIS — J209 Acute bronchitis, unspecified: Secondary | ICD-10-CM | POA: Diagnosis not present

## 2023-02-17 DIAGNOSIS — R059 Cough, unspecified: Secondary | ICD-10-CM | POA: Diagnosis not present

## 2023-02-17 DIAGNOSIS — J40 Bronchitis, not specified as acute or chronic: Secondary | ICD-10-CM | POA: Diagnosis not present

## 2023-02-19 ENCOUNTER — Other Ambulatory Visit: Payer: Self-pay | Admitting: Cardiology

## 2023-02-23 DIAGNOSIS — J989 Respiratory disorder, unspecified: Secondary | ICD-10-CM | POA: Diagnosis not present

## 2023-02-23 DIAGNOSIS — N3020 Other chronic cystitis without hematuria: Secondary | ICD-10-CM | POA: Diagnosis not present

## 2023-02-23 DIAGNOSIS — R0602 Shortness of breath: Secondary | ICD-10-CM | POA: Diagnosis not present

## 2023-02-23 DIAGNOSIS — K219 Gastro-esophageal reflux disease without esophagitis: Secondary | ICD-10-CM | POA: Diagnosis not present

## 2023-02-23 DIAGNOSIS — R053 Chronic cough: Secondary | ICD-10-CM | POA: Diagnosis not present

## 2023-03-03 DIAGNOSIS — M810 Age-related osteoporosis without current pathological fracture: Secondary | ICD-10-CM | POA: Diagnosis not present

## 2023-03-17 ENCOUNTER — Ambulatory Visit: Payer: Medicare PPO | Admitting: Pulmonary Disease

## 2023-03-17 ENCOUNTER — Encounter: Payer: Self-pay | Admitting: Pulmonary Disease

## 2023-03-17 VITALS — BP 122/84 | HR 74 | Ht 64.0 in | Wt 201.6 lb

## 2023-03-17 DIAGNOSIS — R053 Chronic cough: Secondary | ICD-10-CM | POA: Diagnosis not present

## 2023-03-17 MED ORDER — BUDESONIDE 0.5 MG/2ML IN SUSP: 0.5000 mg | Freq: Two times a day (BID) | RESPIRATORY_TRACT | 5 refills | Status: DC

## 2023-03-17 MED ORDER — ALBUTEROL SULFATE HFA 108 (90 BASE) MCG/ACT IN AERS: 2.0000 | INHALATION_SPRAY | Freq: Four times a day (QID) | RESPIRATORY_TRACT | 6 refills | Status: AC | PRN

## 2023-03-17 NOTE — Patient Instructions (Addendum)
Continue pantoprazole 40mg  daily, 30 minutes before breakfast  Recommend using a wedge pillow to sleep with your upper body elevated at night to reduce night time reflux  Recommend sitting up right for 2-3 hours after eating.   We will order you a nebulizer machine and supplies  Start budesonide 0.5mg  nebulizer treatments twice daily  Use albuterol inhaler 1-2 puffs every 4-6 hours as needed for cough, wheezing and shortness of breath  Stop using breztri inhaler  We will check a CT Chest scan to evaluate your cough  Follow up in 2 months with pulmonary function tests

## 2023-03-17 NOTE — Progress Notes (Signed)
Synopsis: Referred in June 2024 for Chronic Cough by Linus Galas, NP  Subjective:   PATIENT ID: Vicki Morris GENDER: female DOB: 1935/10/27, MRN: [id number]  HPI  Chief Complaint  Patient presents with   Consult    Referred by PCP for chronic cough and increased SOB for the past few years. Recently had bronchitis. Productive cough with clear phlegm.    Vicki Morris is an 87 year old woman, never smoker with history of GERD, hypertension, hypothyroidism, paroxysmal atrial fibrillation and obesity who is referred to pulmonary clinic for cough.   She reports having cough for 2 months The cough can make her short of breath. This cough started May 8. She has had prior episodes of prolonged cough symptoms. She has been on course of steroids and breathing treatment and antibiotics.   She had covid 9/23 with prolonged cough symptoms. She has had several boughts of bronchitis.   She has night time awakenings due to cough. Once in a while she will feel like food gets caught in her throat. No post nasal drip. She had sinus surgery many years ago. She took allergy shots for years. She is not taking any allergy medicines. No seasonal component to her cough. She is taking pantoprazole 40mg  daily for GERD, which was a recent change. She was started on breztri inhaler but she is having a hard time using this.   She has knee and bilateral hip pains. She has watery eyes.   She had significant second hand smoke exposure. Her husband died from lung cancer and COPD.   Past Medical History:  Diagnosis Date   Allergic rhinitis, cause unspecified    Anxiety    Arthritis    Chronic anticoagulation    Depression    Dysrhythmia    afib    GERD (gastroesophageal reflux disease)    History of echocardiogram    Echo 7/22: EF 55-60, no RWMA, mild asymmetric LVH, normal RVSF, mild LAE, trivial MR, mild AV sclerosis without stenosis   History of TIAs    Hyperlipidemia    Hypertension    Hypothyroidism     Normal nuclear stress test 12/2011   EF 64%, clinically and electrically negative for ischemia. Myoview scan with apical thinning, could not exclude small subendocardial scar. No ischemia. // Myoview 7/22: EF 58, no ischemia, low risk   Obesity    PAF (paroxysmal atrial fibrillation) (HCC)    Peripheral vascular disease (HCC)    Pneumonia    hx of    Polyp of nasal cavity    PONV (postoperative nausea and vomiting)    Stroke (HCC)    mini stroke  years ago    Urinary tract infection    hx of      Family History  Problem Relation Age of Onset   Heart disease Mother    Heart disease Father    Heart attack Father    Breast cancer Paternal Aunt      Social History   Socioeconomic History   Marital status: Widowed    Spouse name: Not on file   Number of children: Not on file   Years of education: Not on file   Highest education level: Not on file  Occupational History   Occupation: Retired  Tobacco Use   Smoking status: Never   Smokeless tobacco: Never  Vaping Use   Vaping Use: Never used  Substance and Sexual Activity   Alcohol use: No   Drug use: No   Sexual  activity: Never  Other Topics Concern   Not on file  Social History Narrative   Not on file   Social Determinants of Health   Financial Resource Strain: Not on file  Food Insecurity: Not on file  Transportation Needs: Not on file  Physical Activity: Not on file  Stress: Not on file  Social Connections: Not on file  Intimate Partner Violence: Not on file     Allergies  Allergen Reactions   Other Anaphylaxis and Other (See Comments)    Red meat, Pork, Beef, Lamb   Beef-Derived Products    Contrast Media [Iodinated Contrast Media] Hives   Meat Extract     Other reaction(s): anaphylaxis   Sulfonamide Derivatives Other (See Comments)    Unknown   Clarithromycin Nausea And Vomiting   Nabumetone Rash     Outpatient Medications Prior to Visit  Medication Sig Dispense Refill   acetaminophen  (TYLENOL) 500 MG tablet Take 1,000 mg by mouth every 6 (six) hours as needed for moderate pain or headache.     buPROPion (WELLBUTRIN XL) 150 MG 24 hr tablet Take 150 mg by mouth daily.     Calcium Carb-Cholecalciferol 600-10 MG-MCG TABS Take 1 tablet by mouth daily.     denosumab (PROLIA) 60 MG/ML SOSY injection Inject 60 mg into the skin every 6 (six) months.     diltiazem (CARDIZEM CD) 240 MG 24 hr capsule Take 240 mg by mouth at bedtime.      EPINEPHrine 0.3 mg/0.3 mL IJ SOAJ injection Inject 0.3 mg into the muscle as needed for anaphylaxis.     ezetimibe (ZETIA) 10 MG tablet TAKE ONE TABLET BY MOUTH EVERY EVENING 90 tablet 0   ferrous sulfate 325 (65 FE) MG EC tablet Take 325 mg by mouth daily with breakfast.     olmesartan-hydrochlorothiazide (BENICAR HCT) 40-25 MG tablet Take 1 tablet by mouth daily.     omeprazole (PRILOSEC) 20 MG capsule Take 20 mg by mouth daily.     polyethylene glycol (MIRALAX / GLYCOLAX) 17 g packet Take 17 g by mouth daily as needed for mild constipation.     Potassium Chloride ER 20 MEQ TBCR Take 1 tablet by mouth every other day.     rivaroxaban (XARELTO) 20 MG TABS tablet Take 20 mg by mouth daily with supper.     rosuvastatin (CRESTOR) 20 MG tablet TAKE ONE TABLET BY MOUTH EVERY EVENING 90 tablet 0   sertraline (ZOLOFT) 50 MG tablet Take 50 mg by mouth at bedtime.      SYNTHROID 100 MCG tablet Take 100 mcg by mouth daily before breakfast.   0   VITAMIN D PO Take by mouth daily.     clobetasol ointment (TEMOVATE) 0.05 % Apply 1 application topically at bedtime as needed.     diphenhydrAMINE (BENADRYL) 25 mg capsule Take 25 mg by mouth daily as needed for allergies.      LORazepam (ATIVAN) 0.5 MG tablet Take 0.5 mg by mouth at bedtime as needed for sleep.      nitrofurantoin, macrocrystal-monohydrate, (MACROBID) 100 MG capsule Take 100 mg by mouth daily. Continuously     nitroGLYCERIN (NITROSTAT) 0.4 MG SL tablet Place 1 tablet (0.4 mg total) under the tongue  every 5 (five) minutes as needed for chest pain. 30 tablet 0   olmesartan-hydrochlorothiazide (BENICAR HCT) 20-12.5 MG tablet Take 1 tablet by mouth daily.  0   No facility-administered medications prior to visit.   Review of Systems  Constitutional:  Negative  for chills, fever, malaise/fatigue and weight loss.  HENT:  Negative for congestion, sinus pain and sore throat.   Eyes: Negative.   Respiratory:  Positive for cough and shortness of breath. Negative for hemoptysis, sputum production and wheezing.   Cardiovascular:  Negative for chest pain, palpitations, orthopnea, claudication and leg swelling.  Gastrointestinal:  Negative for abdominal pain, heartburn, nausea and vomiting.  Genitourinary: Negative.   Musculoskeletal:  Positive for joint pain. Negative for myalgias.  Skin:  Negative for rash.  Neurological:  Negative for weakness.  Endo/Heme/Allergies: Negative.   Psychiatric/Behavioral: Negative.     Objective:   Vitals:   03/17/23 1001  BP: 122/84  Pulse: 74  SpO2: 97%  Weight: 201 lb 9.6 oz (91.4 kg)  Height: 5\' 4"  (1.626 m)   Physical Exam Constitutional:      General: She is not in acute distress.    Appearance: She is obese. She is not ill-appearing.  HENT:     Head: Normocephalic and atraumatic.  Eyes:     General: No scleral icterus.    Conjunctiva/sclera: Conjunctivae normal.  Cardiovascular:     Rate and Rhythm: Normal rate and regular rhythm.     Pulses: Normal pulses.     Heart sounds: Normal heart sounds. No murmur heard. Pulmonary:     Effort: Pulmonary effort is normal.     Breath sounds: Normal breath sounds. No wheezing, rhonchi or rales.  Musculoskeletal:     Right lower leg: No edema.     Left lower leg: No edema.  Skin:    General: Skin is warm and dry.  Neurological:     General: No focal deficit present.     Mental Status: She is alert.    CBC    Component Value Date/Time   WBC 6.8 10/25/2021 0624   RBC 4.03 10/25/2021 0624    HGB 11.1 (L) 10/25/2021 0624   HCT 34.6 (L) 10/25/2021 0624   PLT 274 10/25/2021 0624   MCV 85.9 10/25/2021 0624   MCH 27.5 10/25/2021 0624   MCHC 32.1 10/25/2021 0624   RDW 13.6 10/25/2021 0624   LYMPHSABS 1.6 10/25/2021 0624   MONOABS 0.9 10/25/2021 0624   EOSABS 0.4 10/25/2021 0624   BASOSABS 0.1 10/25/2021 0624      Latest Ref Rng & Units 10/25/2021    6:24 AM 09/09/2018   10:44 AM 10/07/2017   12:14 PM  BMP  Glucose 70 - 99 mg/dL 409  811  914   BUN 8 - 23 mg/dL 21  21  13    Creatinine 0.44 - 1.00 mg/dL 7.82  9.56  2.13   BUN/Creat Ratio 12 - 28   18   Sodium 135 - 145 mmol/L 139  142  144   Potassium 3.5 - 5.1 mmol/L 3.2  3.7  4.4   Chloride 98 - 111 mmol/L 103  106  104   CO2 22 - 32 mmol/L 25  25  23    Calcium 8.9 - 10.3 mg/dL 8.9  9.2  9.4    Chest imaging: CXR 10/25/21 Portable AP semi upright view at 0608 hours. Stable lung volumes and mediastinal contours, with eventration of the diaphragm and chronic hiatal hernia. Visualized tracheal air column is within normal limits.   Allowing for portable technique the lungs are clear. No pneumothorax or pleural effusion. No acute osseous abnormality identified  PFT:     No data to display          Labs:  Path:  Echo: 2022: EF 55-60%, Grade I diastolic dysfunction, RV size and systolic function  Heart Catheterization:  Assessment & Plan:   Chronic cough - Plan: CT CHEST HIGH RESOLUTION, albuterol (VENTOLIN HFA) 108 (90 Base) MCG/ACT inhaler, Ambulatory Referral for DME, Pulmonary function test  Discussion: Vicki Yarbough is an 87 year old woman, never smoker with history of GERD, hypertension, hypothyroidism, paroxysmal atrial fibrillation and obesity who is referred to pulmonary clinic for cough.   Her cough is in the setting of GERD and possibly obstructive lung disease given her significant second hand smoking history.   She is to continue pantoprazole 40mg  daily, 30 minutes before breakfast. Recommend  using a wedge pillow when sleeping. Recommend sitting upright for 2-3 hours after meals.  She is to start budesonide nebulizer treatments twice daily. She is to stop breztri inhaler. We will order her a nebulizer machine and solution.   We will check a high resolution CT Chest to evaluate her cough.  Follow up in 2 months with pulmonary function tests.  Melody Comas, MD [facility] Office: [phone]    Current Outpatient Medications:    acetaminophen (TYLENOL) 500 MG tablet, Take 1,000 mg by mouth every 6 (six) hours as needed for moderate pain or headache., Disp: , Rfl:    albuterol (VENTOLIN HFA) 108 (90 Base) MCG/ACT inhaler, Inhale 2 puffs into the lungs every 6 (six) hours as needed for wheezing or shortness of breath., Disp: 8 g, Rfl: 6   budesonide (PULMICORT) 0.5 MG/2ML nebulizer solution, Take 2 mLs (0.5 mg total) by nebulization 2 (two) times daily., Disp: 120 mL, Rfl: 5   buPROPion (WELLBUTRIN XL) 150 MG 24 hr tablet, Take 150 mg by mouth daily., Disp: , Rfl:    Calcium Carb-Cholecalciferol 600-10 MG-MCG TABS, Take 1 tablet by mouth daily., Disp: , Rfl:    denosumab (PROLIA) 60 MG/ML SOSY injection, Inject 60 mg into the skin every 6 (six) months., Disp: , Rfl:    diltiazem (CARDIZEM CD) 240 MG 24 hr capsule, Take 240 mg by mouth at bedtime. , Disp: , Rfl:    EPINEPHrine 0.3 mg/0.3 mL IJ SOAJ injection, Inject 0.3 mg into the muscle as needed for anaphylaxis., Disp: , Rfl:    ezetimibe (ZETIA) 10 MG tablet, TAKE ONE TABLET BY MOUTH EVERY EVENING, Disp: 90 tablet, Rfl: 0   ferrous sulfate 325 (65 FE) MG EC tablet, Take 325 mg by mouth daily with breakfast., Disp: , Rfl:    olmesartan-hydrochlorothiazide (BENICAR HCT) 40-25 MG tablet, Take 1 tablet by mouth daily., Disp: , Rfl:    omeprazole (PRILOSEC) 20 MG capsule, Take 20 mg by mouth daily., Disp: , Rfl:    polyethylene glycol (MIRALAX / GLYCOLAX) 17 g packet, Take 17 g by mouth daily as needed for  mild constipation., Disp: , Rfl:    Potassium Chloride ER 20 MEQ TBCR, Take 1 tablet by mouth every other day., Disp: , Rfl:    rivaroxaban (XARELTO) 20 MG TABS tablet, Take 20 mg by mouth daily with supper., Disp: , Rfl:    rosuvastatin (CRESTOR) 20 MG tablet, TAKE ONE TABLET BY MOUTH EVERY EVENING, Disp: 90 tablet, Rfl: 0   sertraline (ZOLOFT) 50 MG tablet, Take 50 mg by mouth at bedtime. , Disp: , Rfl:    SYNTHROID 100 MCG tablet, Take 100 mcg by mouth daily before breakfast. , Disp: , Rfl: 0   VITAMIN D PO, Take by mouth daily., Disp: , Rfl:

## 2023-03-18 ENCOUNTER — Other Ambulatory Visit: Payer: Self-pay | Admitting: Cardiology

## 2023-03-18 ENCOUNTER — Telehealth: Payer: Self-pay | Admitting: Pulmonary Disease

## 2023-03-18 DIAGNOSIS — J4520 Mild intermittent asthma, uncomplicated: Secondary | ICD-10-CM

## 2023-03-18 NOTE — Telephone Encounter (Signed)
Dr. Francine Graven you placed an order for [facility]. Elease Hashimoto with [employer] sent me a message stating they needed qualifying Dx code for neb

## 2023-03-19 DIAGNOSIS — J4520 Mild intermittent asthma, uncomplicated: Secondary | ICD-10-CM | POA: Diagnosis not present

## 2023-03-19 NOTE — Telephone Encounter (Signed)
Order can be placed under mild intermittent asthma diagnosis code  Thanks, JD

## 2023-03-19 NOTE — Telephone Encounter (Signed)
New order has been placed with updated diagnosis code.

## 2023-03-30 ENCOUNTER — Ambulatory Visit (HOSPITAL_COMMUNITY)
Admission: RE | Admit: 2023-03-30 | Discharge: 2023-03-30 | Disposition: A | Discharge status: Home / Self Care | Payer: Medicare PPO | Source: Ambulatory Visit | Attending: Pulmonary Disease | Admitting: Pulmonary Disease

## 2023-03-30 DIAGNOSIS — R053 Chronic cough: Secondary | ICD-10-CM | POA: Insufficient documentation

## 2023-03-30 DIAGNOSIS — I700 Atherosclerosis of aorta: Secondary | ICD-10-CM | POA: Diagnosis not present

## 2023-03-30 DIAGNOSIS — J439 Emphysema, unspecified: Secondary | ICD-10-CM | POA: Diagnosis not present

## 2023-04-01 ENCOUNTER — Telehealth: Payer: Self-pay | Admitting: Pulmonary Disease

## 2023-04-01 NOTE — Telephone Encounter (Signed)
Patient is scheduled for a OV on 6/28 for a follow up after her PFT. Her PFT is not until September. From her chart, it looks like this appt was scheduled from the waitlist on MyChart. Called patient but she did not answer. Left message for her to call back.

## 2023-04-02 ENCOUNTER — Ambulatory Visit: Payer: Medicare PPO | Admitting: Pulmonary Disease

## 2023-04-05 ENCOUNTER — Other Ambulatory Visit: Payer: Self-pay | Admitting: Cardiology

## 2023-04-12 DIAGNOSIS — I10 Essential (primary) hypertension: Secondary | ICD-10-CM | POA: Diagnosis not present

## 2023-04-12 DIAGNOSIS — E039 Hypothyroidism, unspecified: Secondary | ICD-10-CM | POA: Diagnosis not present

## 2023-04-16 DIAGNOSIS — I480 Paroxysmal atrial fibrillation: Secondary | ICD-10-CM | POA: Diagnosis not present

## 2023-04-16 DIAGNOSIS — Z0000 Encounter for general adult medical examination without abnormal findings: Secondary | ICD-10-CM | POA: Diagnosis not present

## 2023-04-16 DIAGNOSIS — I10 Essential (primary) hypertension: Secondary | ICD-10-CM | POA: Diagnosis not present

## 2023-04-16 DIAGNOSIS — D6859 Other primary thrombophilia: Secondary | ICD-10-CM | POA: Diagnosis not present

## 2023-04-16 DIAGNOSIS — R7303 Prediabetes: Secondary | ICD-10-CM | POA: Diagnosis not present

## 2023-04-16 DIAGNOSIS — E039 Hypothyroidism, unspecified: Secondary | ICD-10-CM | POA: Diagnosis not present

## 2023-04-16 DIAGNOSIS — E782 Mixed hyperlipidemia: Secondary | ICD-10-CM | POA: Diagnosis not present

## 2023-04-16 DIAGNOSIS — K219 Gastro-esophageal reflux disease without esophagitis: Secondary | ICD-10-CM | POA: Diagnosis not present

## 2023-04-18 DIAGNOSIS — J4520 Mild intermittent asthma, uncomplicated: Secondary | ICD-10-CM | POA: Diagnosis not present

## 2023-04-20 DIAGNOSIS — R8271 Bacteriuria: Secondary | ICD-10-CM | POA: Diagnosis not present

## 2023-04-20 DIAGNOSIS — N3020 Other chronic cystitis without hematuria: Secondary | ICD-10-CM | POA: Diagnosis not present

## 2023-04-21 ENCOUNTER — Telehealth: Payer: Self-pay | Admitting: Pulmonary Disease

## 2023-04-21 MED ORDER — PREDNISONE 20 MG PO TABS: 40.0000 mg | ORAL_TABLET | Freq: Every day | ORAL | 0 refills | Status: AC

## 2023-04-21 NOTE — Telephone Encounter (Signed)
Called and spoke with patient's daughter, Aram Beecham [facility]), provided response by Dr. Judeth Horn.  She verbalized understanding.  She wanted a sooner appointment than September 25th, Dr. Francine Graven did not have anything sooner, she was agreeable tto seeing a NP.  Scheduled her to see Rubye Oaks NP on 05/25/2023 at 10 am, advised to arrive by 9:45 am for check in.  She did state that she was put on an antibiotic for a UTI as well.  Nothing further needed.

## 2023-04-21 NOTE — Telephone Encounter (Signed)
Reviewed CT and pulmonary note 03/2023.  High suspicion for reflux.  CT scan reveals hiatal hernia as well as scattered lower lobe mild bronchiectasis with mild bronchial wall thickening.  Suspect chronic reflux with likely silent aspiration or frank aspiration leading to bronchiectatic changes.  Cough is dry.  Can try prednisone 40 mg daily x 5 days given bouts of recurrent bronchitis may be asthma.  New prescription sent.  However do wonder if her bouts of recurrent bronchitis are more like aspiration pneumonitis.  Regardless can try prednisone, if not getting better or gets worse she needs to seek emergency care.

## 2023-04-21 NOTE — Telephone Encounter (Signed)
Spoke with the pt  She c/o increased cough past 2-3 days- non prod  She is wheezing and has slight increase SOB  She denies any fevers, aches  Her throat does feel irritated, like it normally does when she gets bronchitis  She is using her budesonide bid and albuterol inhaler a couple times per day  She was just started on augmentin for UTI  Please advise, thanks!  Allergies  Allergen Reactions   Other Anaphylaxis and Other (See Comments)    Red meat, Pork, Beef, Lamb   Beef-Derived Products    Contrast Media [Iodinated Contrast Media] Hives   Meat Extract     Other reaction(s): anaphylaxis   Sulfonamide Derivatives Other (See Comments)    Unknown   Clarithromycin Nausea And Vomiting   Nabumetone Rash

## 2023-04-22 IMAGING — US US ABDOMEN LIMITED
1 series · 14 of 25 positions shown · non-contrast
Comparison: Abdominopelvic CT 05/09/2020

CLINICAL DATA: Elevated liver enzymes.  Nausea.

EXAM:
ULTRASOUND ABDOMEN LIMITED RIGHT UPPER QUADRANT

[Series 1: us abdomen limited · 0.15mm/px · 14 of 42 slices shown]
[im 1/42]
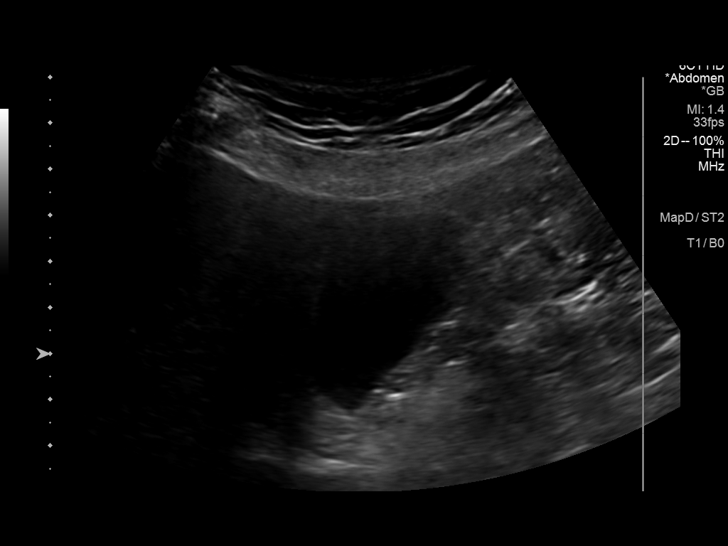
[im 4/42]
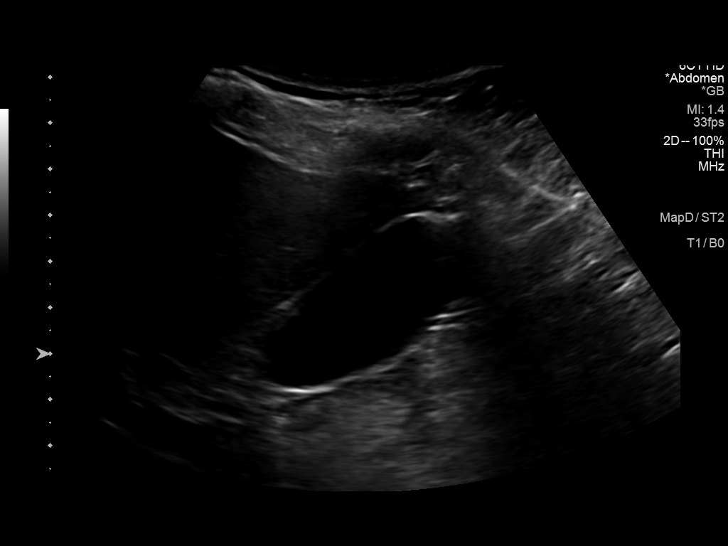
[im 7/42]
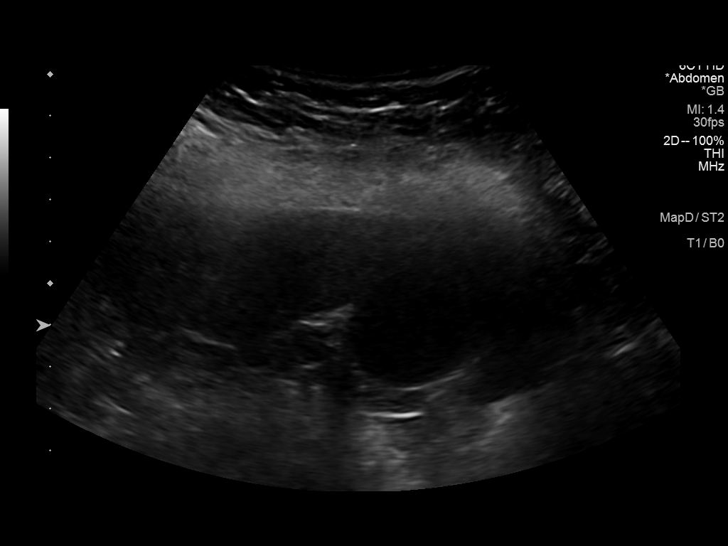
[im 11/42]
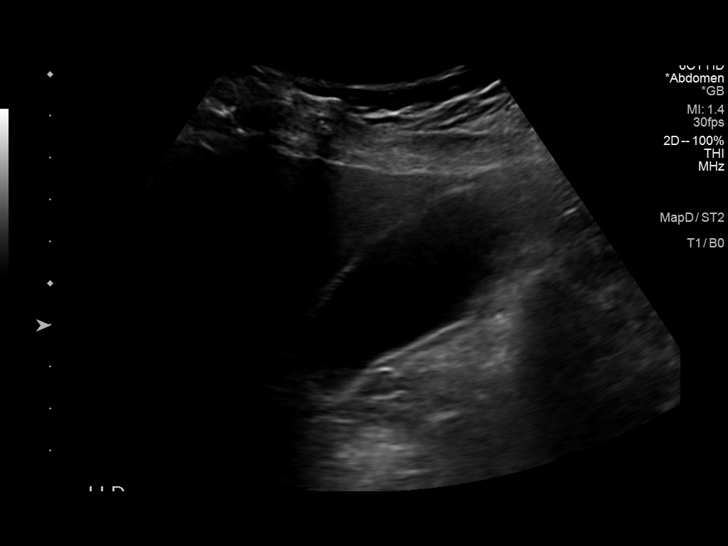
[im 14/42]
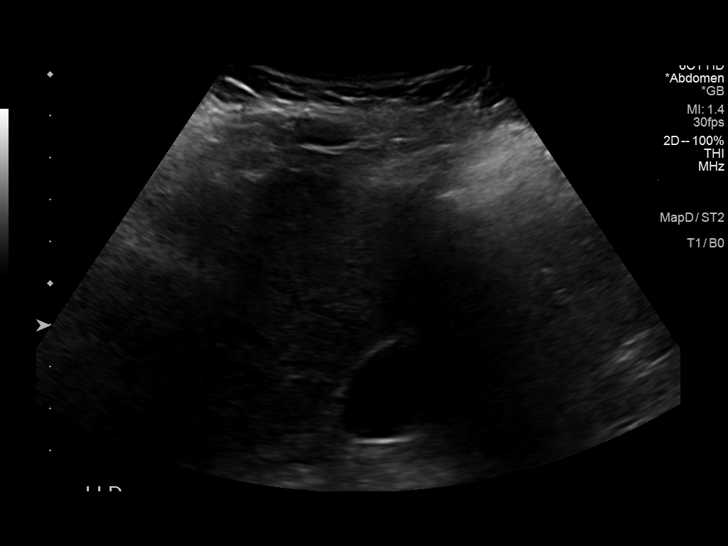
[im 16/42]
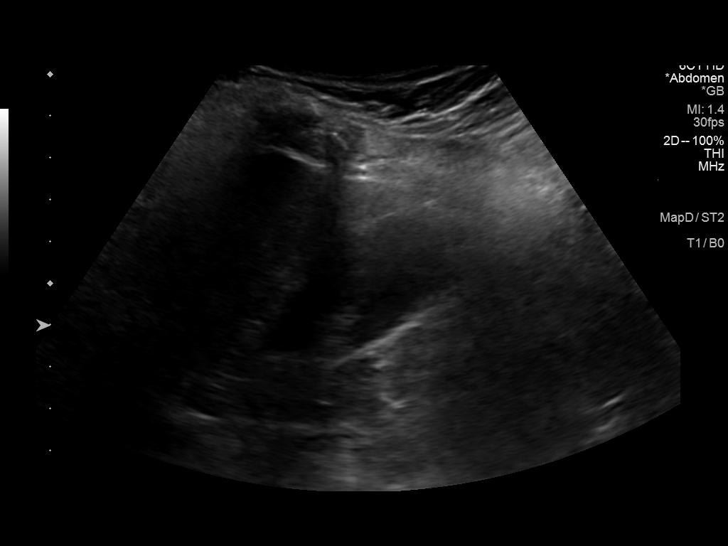
[im 19/42]
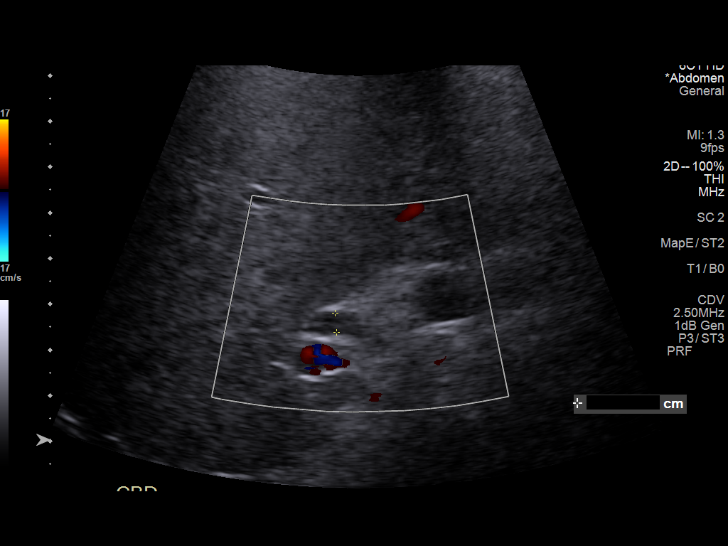
[im 23/42]
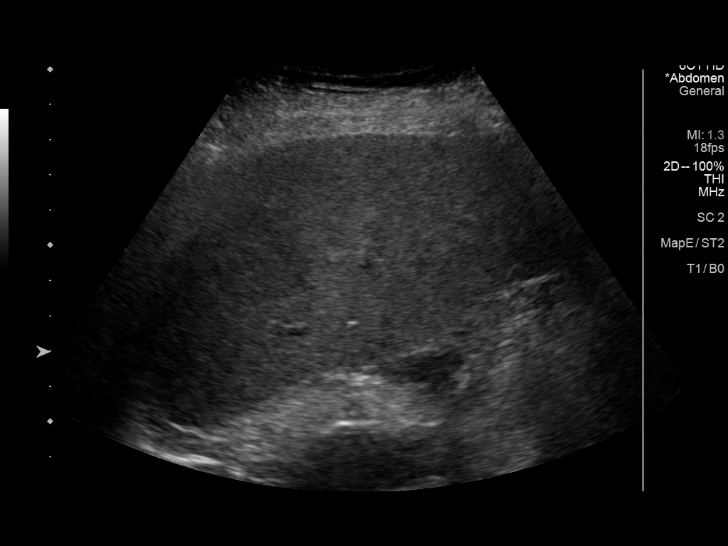
[im 26/42]
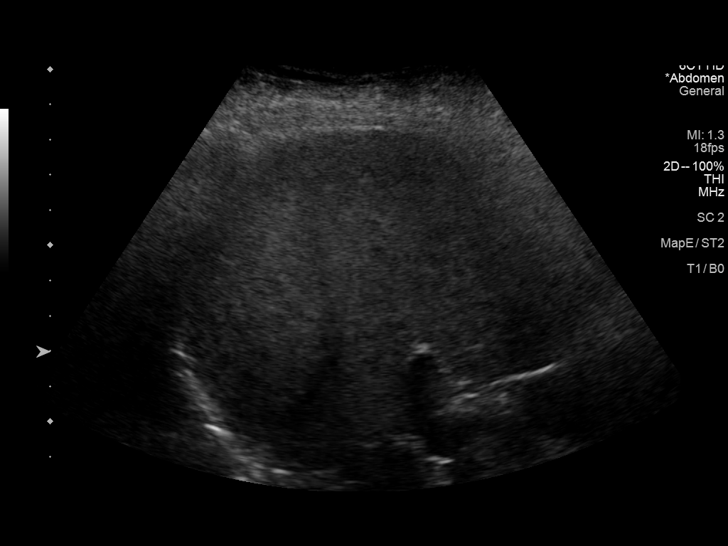
[im 28/42]
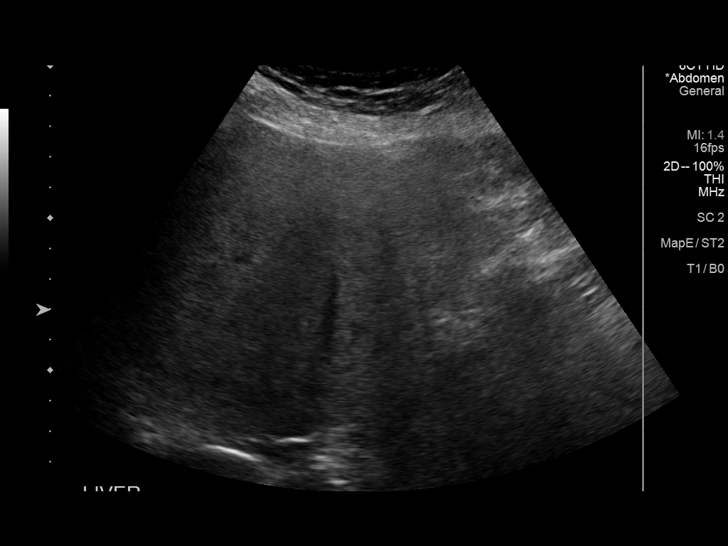
[im 31/42]
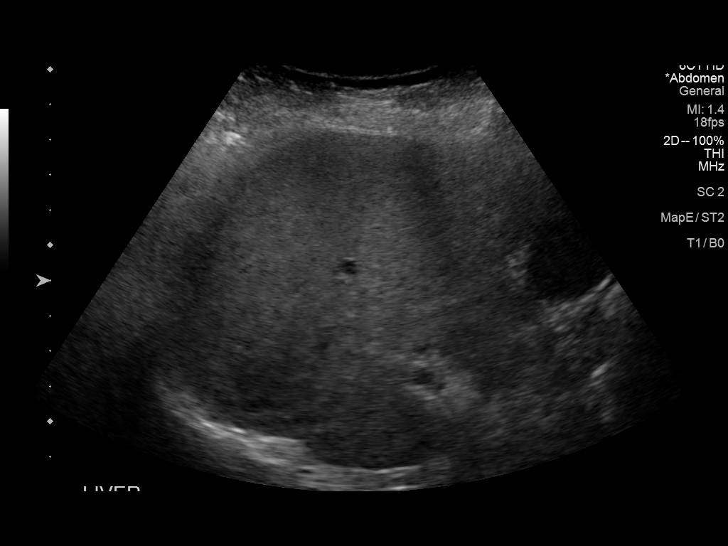
[im 35/42]
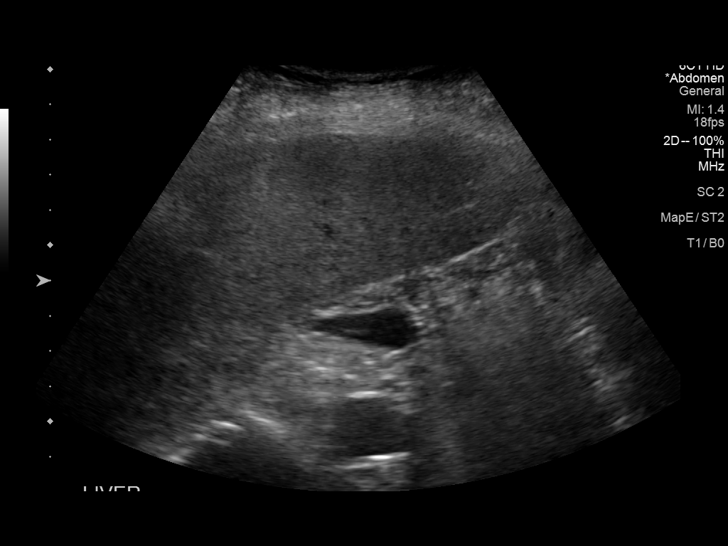
[im 38/42]
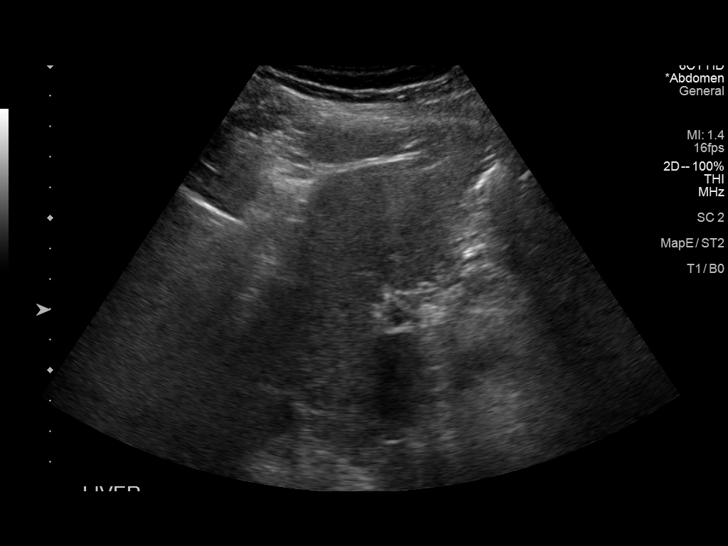
[im 42/42]
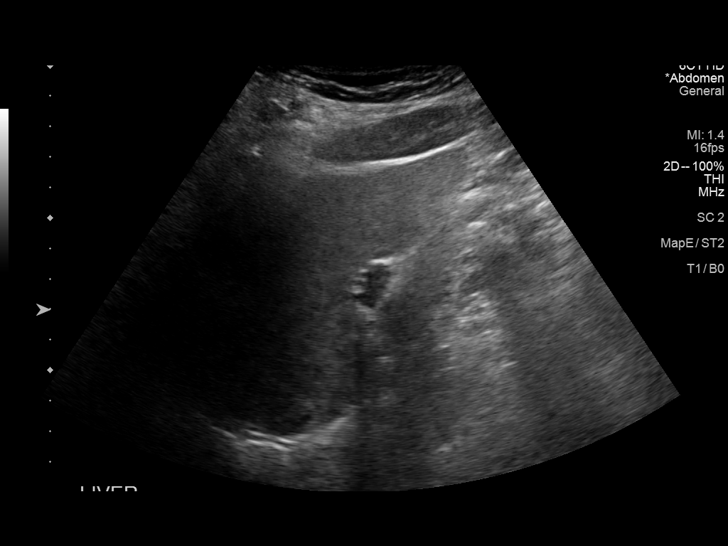

[14 of 25 positions shown; findings below may reference images not displayed]

FINDINGS: Gallbladder:

Physiologically distended. No gallstones or wall thickening
visualized. No sonographic Murphy sign noted by sonographer.

Common bile duct:

Diameter: 4 mm, normal.

Liver:

No focal lesion identified. Heterogeneous and mildly increased in
parenchymal echogenicity. No capsular nodularity. Portal vein is
patent on color Doppler imaging with normal direction of blood flow
towards the liver.

Other: No right upper quadrant ascites.
IMPRESSION: 1. Heterogeneous mildly increased hepatic parenchymal echogenicity
typical of steatosis.
2. Normal sonographic appearance of the gallbladder and biliary
tree.

## 2023-04-26 ENCOUNTER — Ambulatory Visit (INDEPENDENT_AMBULATORY_CARE_PROVIDER_SITE_OTHER): Payer: Medicare PPO

## 2023-04-26 ENCOUNTER — Ambulatory Visit: Payer: Medicare PPO | Admitting: Adult Health

## 2023-04-26 ENCOUNTER — Encounter: Payer: Self-pay | Admitting: Adult Health

## 2023-04-26 VITALS — BP 150/70 | HR 68 | Temp 98.2°F | Ht 64.0 in | Wt 205.8 lb

## 2023-04-26 DIAGNOSIS — J4541 Moderate persistent asthma with (acute) exacerbation: Secondary | ICD-10-CM

## 2023-04-26 DIAGNOSIS — I700 Atherosclerosis of aorta: Secondary | ICD-10-CM | POA: Diagnosis not present

## 2023-04-26 DIAGNOSIS — R059 Cough, unspecified: Secondary | ICD-10-CM | POA: Diagnosis not present

## 2023-04-26 MED ORDER — BENZONATATE 200 MG PO CAPS: 200.0000 mg | ORAL_CAPSULE | Freq: Three times a day (TID) | ORAL | 1 refills | Status: DC | PRN

## 2023-04-26 MED ORDER — ALBUTEROL SULFATE (2.5 MG/3ML) 0.083% IN NEBU
2.5000 mg | INHALATION_SOLUTION | Freq: Once | RESPIRATORY_TRACT | Status: AC
  Administered 2023-04-26: 2.5 mg via RESPIRATORY_TRACT

## 2023-04-26 MED ORDER — ALBUTEROL SULFATE (2.5 MG/3ML) 0.083% IN NEBU: 2.5000 mg | INHALATION_SOLUTION | Freq: Four times a day (QID) | RESPIRATORY_TRACT | 5 refills | Status: AC | PRN

## 2023-04-26 MED ORDER — ARFORMOTEROL TARTRATE 15 MCG/2ML IN NEBU
15.0000 ug | INHALATION_SOLUTION | Freq: Two times a day (BID) | RESPIRATORY_TRACT | 6 refills | Status: DC
Start: 2023-04-26 — End: 2023-12-28

## 2023-04-26 MED ORDER — PREDNISONE 10 MG PO TABS: ORAL_TABLET | ORAL | 0 refills | Status: DC

## 2023-04-26 MED ORDER — METHYLPREDNISOLONE ACETATE 80 MG/ML IJ SUSP
120.0000 mg | Freq: Once | INTRAMUSCULAR | Status: AC
  Administered 2023-04-26: 120 mg via INTRAMUSCULAR

## 2023-04-26 MED ORDER — AMOXICILLIN-POT CLAVULANATE 875-125 MG PO TABS: 1.0000 | ORAL_TABLET | Freq: Two times a day (BID) | ORAL | 0 refills | Status: DC

## 2023-04-26 NOTE — Progress Notes (Signed)
@Patient  ID: Vicki Morris, female    DOB: 1936-04-26, 87 y.o.   MRN: [id number]  Chief Complaint  Patient presents with   Acute Visit    Referring provider: Merri Brunette, MD  HPI: 87 yo never smoker seen for pulmonary consult March 17, 2023 for chronic cough and recurrent bronchitis Medical history significant for A-fib  TEST/EVENTS :   04/26/2023 Acute OV : Cough  Patient presents for an acute office visit.  Patient was seen last month for a pulmonary consult for chronic cough that has been ongoing for the last few months.  She has been treated for recurrent bronchitis.  She says last visit she was started on Pulmicort nebulizer twice daily.  She does feel like that she started to improve.  However around 1 week ago feels that she got sick with nasal congestion, drainage severe coughing paroxysms and wheezing.  She was set up for high-resolution CT chest that was done on March 30, 2023 that showed bandlike scarring in the bilateral lung bases, negative for interstitial lung disease.  Minimal emphysema.  And some tree-in-bud nodules in the right middle lobe.  Patient also has had a urinary tract infection.  Was recently seen last week and started on Augmentin.  She is on day 6 of 7 of therapy.  She was also called in prednisone 40 mg daily for 5 days.  She is currently on day 4 of 5 of this.  She says she is just not feeling any better.  She continues to have severe coughing paroxysms with stress incontinence.  She denies any hemoptysis, chest pain, orthopnea, edema. Chest x-ray today shows increased interstitial markings along the right midlung.   Allergies  Allergen Reactions   Other Anaphylaxis and Other (See Comments)    Red meat, Pork, Beef, Lamb   Beef-Derived Products    Contrast Media [Iodinated Contrast Media] Hives   Meat Extract     Other reaction(s): anaphylaxis   Sulfonamide Derivatives Other (See Comments)    Unknown   Clarithromycin Nausea And Vomiting   Nabumetone  Rash    Immunization History  Administered Date(s) Administered   Influenza Split 07/06/2011, 07/18/2012, 07/05/2014   Influenza Whole 08/05/2008, 07/05/2009   Influenza, High Dose Seasonal PF 07/06/2015   Influenza,inj,Quad PF,6+ Mos 06/21/2013   Pneumococcal Polysaccharide-23 10/05/2002, 03/20/2009   Tdap 02/25/2018    Past Medical History:  Diagnosis Date   Allergic rhinitis, cause unspecified    Anxiety    Arthritis    Chronic anticoagulation    Depression    Dysrhythmia    afib    GERD (gastroesophageal reflux disease)    History of echocardiogram    Echo 7/22: EF 55-60, no RWMA, mild asymmetric LVH, normal RVSF, mild LAE, trivial MR, mild AV sclerosis without stenosis   History of TIAs    Hyperlipidemia    Hypertension    Hypothyroidism    Normal nuclear stress test 12/2011   EF 64%, clinically and electrically negative for ischemia. Myoview scan with apical thinning, could not exclude small subendocardial scar. No ischemia. // Myoview 7/22: EF 58, no ischemia, low risk   Obesity    PAF (paroxysmal atrial fibrillation) (HCC)    Peripheral vascular disease (HCC)    Pneumonia    hx of    Polyp of nasal cavity    PONV (postoperative nausea and vomiting)    Stroke (HCC)    mini stroke  years ago    Urinary tract infection  hx of     Tobacco History: Social History   Tobacco Use  Smoking Status Never  Smokeless Tobacco Never   Counseling given: Not Answered   Outpatient Medications Prior to Visit  Medication Sig Dispense Refill   acetaminophen (TYLENOL) 500 MG tablet Take 1,000 mg by mouth every 6 (six) hours as needed for moderate pain or headache.     albuterol (VENTOLIN HFA) 108 (90 Base) MCG/ACT inhaler Inhale 2 puffs into the lungs every 6 (six) hours as needed for wheezing or shortness of breath. 8 g 6   budesonide (PULMICORT) 0.5 MG/2ML nebulizer solution Take 2 mLs (0.5 mg total) by nebulization 2 (two) times daily. 120 mL 5   buPROPion  (WELLBUTRIN XL) 150 MG 24 hr tablet Take 150 mg by mouth daily.     Calcium Carb-Cholecalciferol 600-10 MG-MCG TABS Take 1 tablet by mouth daily.     denosumab (PROLIA) 60 MG/ML SOSY injection Inject 60 mg into the skin every 6 (six) months.     diltiazem (CARDIZEM CD) 240 MG 24 hr capsule Take 240 mg by mouth at bedtime.      EPINEPHrine 0.3 mg/0.3 mL IJ SOAJ injection Inject 0.3 mg into the muscle as needed for anaphylaxis.     ezetimibe (ZETIA) 10 MG tablet Take 1 tablet (10 mg total) by mouth every evening. Pt needs to be seen in office for further refills. 1st attempt 30 tablet 0   ferrous sulfate 325 (65 FE) MG EC tablet Take 325 mg by mouth daily with breakfast.     olmesartan-hydrochlorothiazide (BENICAR HCT) 40-25 MG tablet Take 1 tablet by mouth daily.     omeprazole (PRILOSEC) 20 MG capsule Take 20 mg by mouth daily.     polyethylene glycol (MIRALAX / GLYCOLAX) 17 g packet Take 17 g by mouth daily as needed for mild constipation.     Potassium Chloride ER 20 MEQ TBCR Take 1 tablet by mouth every other day.     predniSONE (DELTASONE) 20 MG tablet Take 2 tablets (40 mg total) by mouth daily with breakfast for 5 days. 10 tablet 0   rivaroxaban (XARELTO) 20 MG TABS tablet Take 20 mg by mouth daily with supper.     rosuvastatin (CRESTOR) 20 MG tablet TAKE ONE TABLET BY MOUTH EVERY EVENING 30 tablet 0   sertraline (ZOLOFT) 50 MG tablet Take 50 mg by mouth at bedtime.      SYNTHROID 100 MCG tablet Take 100 mcg by mouth daily before breakfast.   0   VITAMIN D PO Take by mouth daily.     No facility-administered medications prior to visit.     Review of Systems:   Constitutional:   No  weight loss, night sweats,  Fevers, chills, +fatigue, or  lassitude.  HEENT:   No headaches,  Difficulty swallowing,  Tooth/dental problems, or  Sore throat,                No sneezing, itching, ear ache, nasal congestion, post nasal drip,   CV:  No chest pain,  Orthopnea, PND, swelling in lower  extremities, anasarca, dizziness, palpitations, syncope.   GI  No heartburn, indigestion, abdominal pain, nausea, vomiting, diarrhea, change in bowel habits, loss of appetite, bloody stools.   Resp:   No chest wall deformity  Skin: no rash or lesions.  GU: no dysuria, change in color of urine, no urgency or frequency.  No flank pain, no hematuria   MS:  No joint pain or swelling.  No decreased range of motion.  No back pain.    Physical Exam  BP (!) 150/70 (BP Location: Left Arm, Patient Position: Sitting, Cuff Size: Large)   Pulse 68   Temp 98.2 F (36.8 C) (Oral)   Ht 5\' 4"  (1.626 m)   Wt 205 lb 12.8 oz (93.4 kg)   SpO2 93%   BMI 35.33 kg/m   GEN: A/Ox3; pleasant , NAD, well nourished    HEENT:  [place]/AT,  EACs-clear, TMs-wnl, NOSE-clear, THROAT-clear, no lesions, no postnasal drip or exudate noted.   NECK:  Supple w/ fair ROM; no JVD; normal carotid impulses w/o bruits; no thyromegaly or nodules palpated; no lymphadenopathy.    RESP coarse rhonchi bilaterally with a few expiratory wheezes, speaks in full sentences. no accessory muscle use, no dullness to percussion  CARD:  RRR, no m/r/g, tr  peripheral edema, pulses intact, no cyanosis or clubbing.  GI:   Soft & nt; nml bowel sounds; no organomegaly or masses detected.   Musco: Warm bil, no deformities or joint swelling noted.   Neuro: alert, no focal deficits noted.    Skin: Warm, no lesions or rashes    Lab Results:    BNP No results found for: "BNP"    Imaging: DG Chest 2 View  Result Date: 04/26/2023 CLINICAL DATA:  Cough EXAM: CHEST - 2 VIEW COMPARISON:  10/25/2021, 03/30/2023 FINDINGS: The heart size and mediastinal contours are within normal limits. Aortic atherosclerosis. Small area of prominent interstitial markings in the right mid lung. No pleural effusion or pneumothorax. The visualized skeletal structures are unremarkable. IMPRESSION: Small area of prominent interstitial markings in the right mid  lung, which may reflect a site of atypical infection or inflammation. Electronically Signed   By: Duanne Guess D.O.   On: 04/26/2023 12:14   CT CHEST HIGH RESOLUTION  Result Date: 04/03/2023 CLINICAL DATA:  Chronic cough, rule out interstitial lung disease EXAM: CT CHEST WITHOUT CONTRAST TECHNIQUE: Multidetector CT imaging of the chest was performed following the standard protocol without intravenous contrast. High resolution imaging of the lungs, as well as inspiratory and expiratory imaging, was performed. RADIATION DOSE REDUCTION: This exam was performed according to the departmental dose-optimization program which includes automated exposure control, adjustment of the mA and/or kV according to patient size and/or use of iterative reconstruction technique. COMPARISON:  None Available. FINDINGS: Cardiovascular: Aortic atherosclerosis. Normal heart size. Left coronary artery calcifications. No pericardial effusion. Mediastinum/Nodes: No enlarged mediastinal, hilar, or axillary lymph nodes. Moderate hiatal hernia with intrathoracic position of the gastric fundus. Thyroid gland, trachea, and esophagus demonstrate no significant findings. Lungs/Pleura: Minimal paraseptal emphysema. Mild, bland appearing and predominantly bandlike scarring of the bilateral lung bases. Occasional tiny clustered centrilobular and tree-in-bud nodules, for example in the lateral segment right middle lobe (series 4, image 145). No significant air trapping on expiratory phase imaging. Mild tracheobronchomalacia (series 6, image 5). No pleural effusion or pneumothorax. Upper Abdomen: No acute abnormality. Musculoskeletal: No chest wall abnormality. No acute osseous findings. IMPRESSION: 1. Mild, bland appearing and predominantly bandlike scarring of the bilateral lung bases. No evidence of fibrotic interstitial lung disease. 2. Minimal emphysema. 3. Occasional tiny clustered centrilobular and tree-in-bud nodules, for example in the  lateral segment right middle lobe, consistent with minimal atypical infection. 4. Mild tracheobronchomalacia on expiratory phase imaging. No significant air trapping. 5. Hiatal hernia. 6. Coronary artery disease. Aortic Atherosclerosis (ICD10-I70.0) and Emphysema (ICD10-J43.9). Electronically Signed   By: Jearld Lesch M.D.   On: 04/03/2023 21:56  albuterol (PROVENTIL) (2.5 MG/3ML) 0.083% nebulizer solution 2.5 mg     Date Action Dose Route User   04/26/2023 1136 Given 2.5 mg Nebulization Ellin Saba, Ledell Peoples, RN      methylPREDNISolone acetate (DEPO-MEDROL) injection 120 mg     Date Action Dose Route User   04/26/2023 1133 Given 120 mg Intramuscular (Left Ventrogluteal) Ellin Saba, Ledell Peoples, RN           No data to display          No results found for: "NITRICOXIDE"      Assessment & Plan:   AB (asthmatic bronchitis), moderate persistent, with acute exacerbation Recurrent asthmatic bronchitic exacerbation.  Chest x-ray today with some increased markings in the right midlung.  Questionable chronic versus new.  Will extend Augmentin out for an additional 3 days.  That we will complete a 10-day course.  Instructed on aspiration precautions.  Add in Pepcid for reflux control.  Albuterol nebulizer given today in the office with improved aeration and perceived benefit.  Depo-Medrol 120 IM injection today in the office.  She will extend out a prednisone taper over the next week beginning tomorrow. Adding Brovana to her budesonide nebulizer twice daily.   Add Claritin daily Begin cough control regimen with Delsym and Tessalon Perles.  She does have codeine cough syrup at home.  Advised to use with caution as it can cause dizziness and balance issues And may use albuterol inhaler or nebulizer as needed Long discussion with patient and family member that if symptoms or not improving or worsen she will need to go to the emergency room for further evaluation and possible  consideration for hospitalization.  Going forward consider checking allergy panel/IgE and CBC with differential looking at eosinophils when she is off of prednisone.  Plan  Patient Instructions  Prednisone taper over next week Extend Augmentin for 3 days.  Begin Delsym 2 tsp Twice daily  for cough As needed   Begin Tessalon Three times a day  for cough as needed.  Continue on Budesonide neb Twice daily   Add Brovana neb Twice daily  (pharmacy to file on Medicare B)  Albuterol inhaler or neb As needed   Chest xray today  Add Claritin 10mg  At bedtime   Add Pepcid 20mg  At bedtime   Follow up with in 1 week as planned and As needed   Please contact office for sooner follow up if symptoms do not improve or worsen or seek emergency care         I spent  45 minutes dedicated to the care of this patient on the date of this encounter to include pre-visit review of records, face-to-face time with the patient discussing conditions above, post visit ordering of testing, clinical documentation with the electronic health record, making appropriate referrals as documented, and communicating necessary findings to members of the patients care team.    Rubye Oaks, NP 04/26/2023

## 2023-04-26 NOTE — Patient Instructions (Addendum)
Prednisone taper over next week Extend Augmentin for 3 days.  Begin Delsym 2 tsp Twice daily  for cough As needed   Begin Tessalon Three times a day  for cough as needed.  Continue on Budesonide neb Twice daily   Add Brovana neb Twice daily  (pharmacy to file on Medicare B)  Albuterol inhaler or neb As needed   Chest xray today  Add Claritin 10mg  At bedtime   Add Pepcid 20mg  At bedtime   Follow up with in 1 week as planned and As needed   Please contact office for sooner follow up if symptoms do not improve or worsen or seek emergency care

## 2023-04-26 NOTE — Assessment & Plan Note (Addendum)
Recurrent asthmatic bronchitic exacerbation.  Chest x-ray today with some increased markings in the right midlung.  Questionable chronic versus new.  Will extend Augmentin out for an additional 3 days.  That we will complete a 10-day course.  Instructed on aspiration precautions.  Add in Pepcid for reflux control.  Albuterol nebulizer given today in the office with improved aeration and perceived benefit.  Depo-Medrol 120 IM injection today in the office.  She will extend out a prednisone taper over the next week beginning tomorrow. Adding Brovana to her budesonide nebulizer twice daily.   Add Claritin daily Begin cough control regimen with Delsym and Tessalon Perles.  She does have codeine cough syrup at home.  Advised to use with caution as it can cause dizziness and balance issues And may use albuterol inhaler or nebulizer as needed Long discussion with patient and family member that if symptoms or not improving or worsen she will need to go to the emergency room for further evaluation and possible consideration for hospitalization.  Going forward consider checking allergy panel/IgE and CBC with differential looking at eosinophils when she is off of prednisone.  Plan  Patient Instructions  Prednisone taper over next week Extend Augmentin for 3 days.  Begin Delsym 2 tsp Twice daily  for cough As needed   Begin Tessalon Three times a day  for cough as needed.  Continue on Budesonide neb Twice daily   Add Brovana neb Twice daily  (pharmacy to file on Medicare B)  Albuterol inhaler or neb As needed   Chest xray today  Add Claritin 10mg  At bedtime   Add Pepcid 20mg  At bedtime   Follow up with in 1 week as planned and As needed   Please contact office for sooner follow up if symptoms do not improve or worsen or seek emergency care

## 2023-05-03 ENCOUNTER — Ambulatory Visit: Payer: Medicare PPO | Admitting: Adult Health

## 2023-05-03 ENCOUNTER — Encounter: Payer: Self-pay | Admitting: Adult Health

## 2023-05-03 VITALS — BP 118/80 | HR 69 | Ht 65.0 in | Wt 206.0 lb

## 2023-05-03 DIAGNOSIS — J302 Other seasonal allergic rhinitis: Secondary | ICD-10-CM

## 2023-05-03 DIAGNOSIS — J4541 Moderate persistent asthma with (acute) exacerbation: Secondary | ICD-10-CM

## 2023-05-03 DIAGNOSIS — J3089 Other allergic rhinitis: Secondary | ICD-10-CM

## 2023-05-03 DIAGNOSIS — K219 Gastro-esophageal reflux disease without esophagitis: Secondary | ICD-10-CM | POA: Diagnosis not present

## 2023-05-03 MED ORDER — HYDROCODONE BIT-HOMATROP MBR 5-1.5 MG/5ML PO SOLN: 5.0000 mL | Freq: Four times a day (QID) | ORAL | 0 refills | Status: AC | PRN

## 2023-05-03 MED ORDER — PREDNISONE 10 MG PO TABS: ORAL_TABLET | ORAL | 0 refills | Status: DC

## 2023-05-03 NOTE — Progress Notes (Addendum)
@Patient  ID: Vicki Morris, female    DOB: 23-Jul-1936, 87 y.o.   MRN: [id number]  Chief Complaint  Patient presents with   Follow-up    Referring provider: Merri Brunette, MD  HPI: 87 year old female never smoker seen for pulmonary consult March 17, 2023 for chronic cough and recurrent bronchitis Medical history significant for atrial fibrillation   TEST/EVENTS :  High-resolution CT chest that was done on March 30, 2023 that showed bandlike scarring in the bilateral lung bases, negative for interstitial lung disease. Minimal emphysema. And some tree-in-bud nodules in the right middle lobe. Mild tracheobronchomalacia   PFT pending   05/03/23 Follow up: Bronchitis  Patient returns for a 1 week follow-up.  Patient was seen last visit for a slow to resolve asthmatic bronchitis.  She was treated with a 10-day course of Augmentin and prolonged prednisone course .  Chest x-ray shows some increased interstitial markings along the right midlung.  Previous high-resolution CT chest in June showed some bandlike scarring bilaterally along with some tree-in-bud nodules in the right middle lobe.  She was recommended on cough control regimen and reflux medicines. she was recommended on adding Pepcid at bedtime.  Rosalyn Gess was added to her budesonide nebulizer twice daily.  She was started on Claritin.  Recommend to use Delsym and Tessalon.  Patient says she has had some improvement but continues to have a cough that is making her very tired.  She is not coughing up anything discolored.  She has no fever.  No hemoptysis.  Says that she feels like she has got something in her throat that she is constantly got to be throat clearing.  Patient lives at home.  Her daughter helps with her care. Appetite is good with no nausea vomiting or diarrhea.  She has 2 days left of prednisone 10 mg daily.  She has been using some Hydromet cough syrup which does help her rest a little bit.   Allergies  Allergen Reactions    Other Anaphylaxis and Other (See Comments)    Red meat, Pork, Beef, Lamb   Beef-Derived Products    Contrast Media [Iodinated Contrast Media] Hives   Meat Extract     Other reaction(s): anaphylaxis   Sulfonamide Derivatives Other (See Comments)    Unknown   Clarithromycin Nausea And Vomiting   Nabumetone Rash    Immunization History  Administered Date(s) Administered   Influenza Split 07/06/2011, 07/18/2012, 07/05/2014   Influenza Whole 08/05/2008, 07/05/2009   Influenza, High Dose Seasonal PF 07/06/2015   Influenza,inj,Quad PF,6+ Mos 06/21/2013   Pneumococcal Polysaccharide-23 10/05/2002, 03/20/2009   Tdap 02/25/2018    Past Medical History:  Diagnosis Date   Allergic rhinitis, cause unspecified    Anxiety    Arthritis    Chronic anticoagulation    Depression    Dysrhythmia    afib    GERD (gastroesophageal reflux disease)    History of echocardiogram    Echo 7/22: EF 55-60, no RWMA, mild asymmetric LVH, normal RVSF, mild LAE, trivial MR, mild AV sclerosis without stenosis   History of TIAs    Hyperlipidemia    Hypertension    Hypothyroidism    Normal nuclear stress test 12/2011   EF 64%, clinically and electrically negative for ischemia. Myoview scan with apical thinning, could not exclude small subendocardial scar. No ischemia. // Myoview 7/22: EF 58, no ischemia, low risk   Obesity    PAF (paroxysmal atrial fibrillation) (HCC)    Peripheral vascular disease (HCC)  Pneumonia    hx of    Polyp of nasal cavity    PONV (postoperative nausea and vomiting)    Stroke (HCC)    mini stroke  years ago    Urinary tract infection    hx of     Tobacco History: Social History   Tobacco Use  Smoking Status Never  Smokeless Tobacco Never   Counseling given: Not Answered   Outpatient Medications Prior to Visit  Medication Sig Dispense Refill   acetaminophen (TYLENOL) 500 MG tablet Take 1,000 mg by mouth every 6 (six) hours as needed for moderate pain or headache.      albuterol (PROVENTIL) (2.5 MG/3ML) 0.083% nebulizer solution Take 3 mLs (2.5 mg total) by nebulization every 6 (six) hours as needed for wheezing or shortness of breath. 84 each 5   albuterol (VENTOLIN HFA) 108 (90 Base) MCG/ACT inhaler Inhale 2 puffs into the lungs every 6 (six) hours as needed for wheezing or shortness of breath. 8 g 6   arformoterol (BROVANA) 15 MCG/2ML NEBU Take 2 mLs (15 mcg total) by nebulization 2 (two) times daily. 120 mL 6   benzonatate (TESSALON) 200 MG capsule Take 1 capsule (200 mg total) by mouth 3 (three) times daily as needed. 60 capsule 1   budesonide (PULMICORT) 0.5 MG/2ML nebulizer solution Take 2 mLs (0.5 mg total) by nebulization 2 (two) times daily. 120 mL 5   buPROPion (WELLBUTRIN XL) 150 MG 24 hr tablet Take 150 mg by mouth daily.     Calcium Carb-Cholecalciferol 600-10 MG-MCG TABS Take 1 tablet by mouth daily.     denosumab (PROLIA) 60 MG/ML SOSY injection Inject 60 mg into the skin every 6 (six) months.     diltiazem (CARDIZEM CD) 240 MG 24 hr capsule Take 240 mg by mouth at bedtime.      EPINEPHrine 0.3 mg/0.3 mL IJ SOAJ injection Inject 0.3 mg into the muscle as needed for anaphylaxis.     ezetimibe (ZETIA) 10 MG tablet Take 1 tablet (10 mg total) by mouth every evening. Pt needs to be seen in office for further refills. 1st attempt 30 tablet 0   ferrous sulfate 325 (65 FE) MG EC tablet Take 325 mg by mouth daily with breakfast.     olmesartan-hydrochlorothiazide (BENICAR HCT) 40-25 MG tablet Take 1 tablet by mouth daily.     omeprazole (PRILOSEC) 20 MG capsule Take 20 mg by mouth daily.     polyethylene glycol (MIRALAX / GLYCOLAX) 17 g packet Take 17 g by mouth daily as needed for mild constipation.     Potassium Chloride ER 20 MEQ TBCR Take 1 tablet by mouth every other day.     predniSONE (DELTASONE) 10 MG tablet 4 tabs for 2 days, then 3 tabs for 2 days, 2 tabs for 2 days, then 1 tab for 2 days, then stop 20 tablet 0   rivaroxaban (XARELTO) 20 MG  TABS tablet Take 20 mg by mouth daily with supper.     rosuvastatin (CRESTOR) 20 MG tablet TAKE ONE TABLET BY MOUTH EVERY EVENING 30 tablet 0   sertraline (ZOLOFT) 50 MG tablet Take 50 mg by mouth at bedtime.      SYNTHROID 100 MCG tablet Take 100 mcg by mouth daily before breakfast.   0   VITAMIN D PO Take by mouth daily.     amoxicillin-clavulanate (AUGMENTIN) 875-125 MG tablet Take 1 tablet by mouth 2 (two) times daily. (Patient not taking: Reported on 05/03/2023) 6 tablet 0  No facility-administered medications prior to visit.     Review of Systems:   Constitutional:   No  weight loss, night sweats,  Fevers, chills,  +fatigue, or  lassitude.  HEENT:   No headaches,  Difficulty swallowing,  Tooth/dental problems, or  Sore throat,                No sneezing, itching, ear ache,  +nasal congestion, post nasal drip,   CV:  No chest pain,  Orthopnea, PND, swelling in lower extremities, anasarca, dizziness, palpitations, syncope.   GI  No heartburn, indigestion, abdominal pain, nausea, vomiting, diarrhea, change in bowel habits, loss of appetite, bloody stools.   Resp: No chest wall deformity  Skin: no rash or lesions.  GU: no dysuria, change in color of urine, no urgency or frequency.  No flank pain, no hematuria   MS:  No joint pain or swelling.  No decreased range of motion.  No back pain.    Physical Exam  BP 118/80   Pulse 69   Ht 5\' 5"  (1.651 m)   Wt 206 lb (93.4 kg)   SpO2 97%   BMI 34.28 kg/m   GEN: A/Ox3; pleasant , NAD, elderly    HEENT:  [place]/AT,  EACs-clear, TMs-wnl, NOSE-clear, THROAT-clear, no lesions, no postnasal drip or exudate noted.   NECK:  Supple w/ fair ROM; no JVD; normal carotid impulses w/o bruits; no thyromegaly or nodules palpated; no lymphadenopathy.    RESP coarse breath sounds with a few trace rhonchi.  Speaks in full sentences.  . no accessory muscle use, no dullness to percussion  CARD:  RRR, no m/r/g, tr  peripheral edema, pulses intact,  no cyanosis or clubbing.  GI:   Soft & nt; nml bowel sounds; no organomegaly or masses detected.   Musco: Warm bil, no deformities or joint swelling noted.   Neuro: alert, no focal deficits noted.    Skin: Warm, no lesions or rashes    Lab Results:  CBC  BNP No results found for: "BNP"  ProBNP    Component Value Date/Time   PROBNP 48.0 11/25/2011 1112    Imaging: DG Chest 2 View  Result Date: 04/26/2023 CLINICAL DATA:  Cough EXAM: CHEST - 2 VIEW COMPARISON:  10/25/2021, 03/30/2023 FINDINGS: The heart size and mediastinal contours are within normal limits. Aortic atherosclerosis. Small area of prominent interstitial markings in the right mid lung. No pleural effusion or pneumothorax. The visualized skeletal structures are unremarkable. IMPRESSION: Small area of prominent interstitial markings in the right mid lung, which may reflect a site of atypical infection or inflammation. Electronically Signed   By: Duanne Guess D.O.   On: 04/26/2023 12:14    albuterol (PROVENTIL) (2.5 MG/3ML) 0.083% nebulizer solution 2.5 mg     Date Action Dose Route User   04/26/2023 1136 Given 2.5 mg Nebulization Ellin Saba, Ledell Peoples, RN      methylPREDNISolone acetate (DEPO-MEDROL) injection 120 mg     Date Action Dose Route User   04/26/2023 1133 Given 120 mg Intramuscular (Left Ventrogluteal) Ellin Saba, Ledell Peoples, RN           No data to display          No results found for: "NITRICOXIDE"      Assessment & Plan:   AB (asthmatic bronchitis), moderate persistent, with acute exacerbation Recurrent asthmatic bronchitic exacerbation with slow to resolve flare.  Continue with treatment aimed at trigger prevention and cough control.  Continue on Brovana and budesonide  nebulizer twice daily.  She has albuterol inhaler and nebulizer to use as needed.  May use Delsym and Tessalon for cough control.  Use Hydromet cautiously -advised her and her daughter about sedating effect and  potential for falls Will change Claritin to Allegra.  Also add in chlor tabs at bedtime.  Advised of sedating effect. Extend out prednisone for slow taper over the next 2 weeks. PFTs are pending.  When she is off prednisone consider checking CBC with differential and allergy panel.    Plan  Patient Instructions  Delsym 2 tsp Twice daily  for cough As needed   Tessalon Three times a day  for cough as needed.  Hydromet 1 tsp for severe cough , use with caution.  Continue on Budesonide and Brovana neb Twice daily   Albuterol inhaler or neb As needed   Change Claritin to Allegra 180mg  in am.  Begin Chlorpheniramine 4mg  (chlor tabs ) At bedtime.  Pepcid 20mg  At bedtime   Use sips of water to soothe throat and avoid throat clearing.  Extend Prednisone 10mg  daily for 1 week and then 5mg  daily.  Please contact office for sooner follow up if symptoms do not improve or worsen or seek emergency care        Seasonal and perennial allergic rhinitis Change Claritin to Allegra.  Add in chlor tabs 4 mg at bedtime  Plan  Patient Instructions  Delsym 2 tsp Twice daily  for cough As needed   Tessalon Three times a day  for cough as needed.  Hydromet 1 tsp for severe cough , use with caution.  Continue on Budesonide and Brovana neb Twice daily   Albuterol inhaler or neb As needed   Change Claritin to Allegra 180mg  in am.  Begin Chlorpheniramine 4mg  (chlor tabs ) At bedtime.  Pepcid 20mg  At bedtime   Use sips of water to soothe throat and avoid throat clearing.  Extend Prednisone 10mg  daily for 1 week and then 5mg  daily.  Please contact office for sooner follow up if symptoms do not improve or worsen or seek emergency care        GERD Continue on omeprazole and Pepcid.  GERD diet.  Head of bed elevated at bedtime.  Aspiration precautions     Rubye Oaks, NP 05/03/2023

## 2023-05-03 NOTE — Assessment & Plan Note (Signed)
Change Claritin to Allegra.  Add in chlor tabs 4 mg at bedtime  Plan  Patient Instructions  Delsym 2 tsp Twice daily  for cough As needed   Tessalon Three times a day  for cough as needed.  Hydromet 1 tsp for severe cough , use with caution.  Continue on Budesonide and Brovana neb Twice daily   Albuterol inhaler or neb As needed   Change Claritin to Allegra 180mg  in am.  Begin Chlorpheniramine 4mg  (chlor tabs ) At bedtime.  Pepcid 20mg  At bedtime   Use sips of water to soothe throat and avoid throat clearing.  Extend Prednisone 10mg  daily for 1 week and then 5mg  daily.  Please contact office for sooner follow up if symptoms do not improve or worsen or seek emergency care

## 2023-05-03 NOTE — Assessment & Plan Note (Signed)
Continue on omeprazole and Pepcid.  GERD diet.  Head of bed elevated at bedtime.  Aspiration precautions

## 2023-05-03 NOTE — Assessment & Plan Note (Signed)
Recurrent asthmatic bronchitic exacerbation with slow to resolve flare.  Continue with treatment aimed at trigger prevention and cough control.  Continue on Brovana and budesonide nebulizer twice daily.  She has albuterol inhaler and nebulizer to use as needed.  May use Delsym and Tessalon for cough control.  Use Hydromet cautiously -advised her and her daughter about sedating effect and potential for falls Will change Claritin to Allegra.  Also add in chlor tabs at bedtime.  Advised of sedating effect. Extend out prednisone for slow taper over the next 2 weeks. PFTs are pending.  When she is off prednisone consider checking CBC with differential and allergy panel.    Plan  Patient Instructions  Delsym 2 tsp Twice daily  for cough As needed   Tessalon Three times a day  for cough as needed.  Hydromet 1 tsp for severe cough , use with caution.  Continue on Budesonide and Brovana neb Twice daily   Albuterol inhaler or neb As needed   Change Claritin to Allegra 180mg  in am.  Begin Chlorpheniramine 4mg  (chlor tabs ) At bedtime.  Pepcid 20mg  At bedtime   Use sips of water to soothe throat and avoid throat clearing.  Extend Prednisone 10mg  daily for 1 week and then 5mg  daily.  Please contact office for sooner follow up if symptoms do not improve or worsen or seek emergency care

## 2023-05-03 NOTE — Patient Instructions (Addendum)
Delsym 2 tsp Twice daily  for cough As needed   Tessalon Three times a day  for cough as needed.  Hydromet 1 tsp for severe cough , use with caution.  Continue on Budesonide and Brovana neb Twice daily   Albuterol inhaler or neb As needed   Change Claritin to Allegra 180mg  in am.  Begin Chlorpheniramine 4mg  (chlor tabs ) At bedtime.  Pepcid 20mg  At bedtime   Use sips of water to soothe throat and avoid throat clearing.  Extend Prednisone 10mg  daily for 1 week and then 5mg  daily.  Follow up in 3-4 weeks with Dr. Francine Graven or Kyanne Rials NP  Please contact office for sooner follow up if symptoms do not improve or worsen or seek emergency care

## 2023-05-05 ENCOUNTER — Encounter (HOSPITAL_COMMUNITY): Payer: Self-pay | Admitting: Emergency Medicine

## 2023-05-05 ENCOUNTER — Other Ambulatory Visit: Payer: Self-pay

## 2023-05-05 ENCOUNTER — Emergency Department (HOSPITAL_COMMUNITY)
Admission: EM | Admit: 2023-05-05 | Discharge: 2023-05-06 | Discharge status: Against Medical Advice | Payer: Medicare PPO | Attending: Emergency Medicine | Admitting: Emergency Medicine

## 2023-05-05 DIAGNOSIS — J40 Bronchitis, not specified as acute or chronic: Secondary | ICD-10-CM | POA: Diagnosis not present

## 2023-05-05 DIAGNOSIS — R1030 Lower abdominal pain, unspecified: Secondary | ICD-10-CM | POA: Diagnosis present

## 2023-05-05 DIAGNOSIS — K5900 Constipation, unspecified: Secondary | ICD-10-CM | POA: Insufficient documentation

## 2023-05-05 DIAGNOSIS — Z5321 Procedure and treatment not carried out due to patient leaving prior to being seen by health care provider: Secondary | ICD-10-CM | POA: Insufficient documentation

## 2023-05-05 LAB — CBC
HCT: 43.6 % (ref 36.0–46.0)
Hemoglobin: 14.4 g/dL (ref 12.0–15.0)
MCH: 30.8 pg (ref 26.0–34.0)
MCHC: 33 g/dL (ref 30.0–36.0)
MCV: 93.2 fL (ref 80.0–100.0)
Platelets: 285 10*3/uL (ref 150–400)
RBC: 4.68 MIL/uL (ref 3.87–5.11)
RDW: 12.9 % (ref 11.5–15.5)
WBC: 24.1 10*3/uL — ABNORMAL HIGH (ref 4.0–10.5)
nRBC: 0 % (ref 0.0–0.2)

## 2023-05-05 MED ORDER — ONDANSETRON 4 MG PO TBDP
4.0000 mg | ORAL_TABLET | Freq: Once | ORAL | Status: AC | PRN
  Administered 2023-05-05: 4 mg via ORAL
  Filled 2023-05-05: qty 1

## 2023-05-05 NOTE — ED Triage Notes (Signed)
Patient endorses issues with constipation today around 1530 along with lower bilateral abdominal pain and emesis- reports brown emesis. Recently bx with bronchitis and recently started steroids and abx.

## 2023-05-06 NOTE — ED Notes (Signed)
Pt asked to be taken otf  due to no longer wanting to wait

## 2023-05-07 NOTE — Telephone Encounter (Signed)
Second message from patient's daughter on 05/06/2023.  Tammy, Also to continue my last message...   We did take her to the ER last night because of abdominal pain, but thankfully she was able to have a BM while we were there and she was not admitted.  She did not take any cough medicine last night and was able to sleep without coughing.  You had suggested you see her again in 2 weeks, but that appointment was not scheduled... should we call and make that appointment?  Thanks, Aram Beecham

## 2023-05-07 NOTE — Telephone Encounter (Signed)
Tammy can you help me find a place to schedule her in a week or 2. I am not finding any appointments next week or the week after.

## 2023-05-10 DIAGNOSIS — R2689 Other abnormalities of gait and mobility: Secondary | ICD-10-CM | POA: Diagnosis not present

## 2023-05-10 DIAGNOSIS — M6281 Muscle weakness (generalized): Secondary | ICD-10-CM | POA: Diagnosis not present

## 2023-05-10 NOTE — Telephone Encounter (Signed)
Duplicate message 05/06/23 to Tammy,NP. Closing encounter.

## 2023-05-11 NOTE — Telephone Encounter (Signed)
Can double book me early in am or early pm

## 2023-05-13 ENCOUNTER — Telehealth: Payer: Self-pay

## 2023-05-13 NOTE — Telephone Encounter (Signed)
Transition Care Management Unsuccessful Follow-up Telephone Call  Date of discharge and from where:  [facility] 8[facility]  Attempts:  1st Attempt  Reason for unsuccessful TCM follow-up call:  No answer/busy   Lenard Forth [facility] Guide, [facility] [phone] [address] [place], [place], [place] [address] Phone: [phone] Email: [email or web address]

## 2023-05-13 NOTE — Telephone Encounter (Signed)
Transition Care Management Unsuccessful Follow-up Telephone Call  Date of discharge and from where:  [facility]  Attempts:  2nd Attempt  Reason for unsuccessful TCM follow-up call:  No answer/busy   Lenard Forth [facility] Guide, [facility] [phone] [address] [place], [place], [place] [address] Phone: [phone] Email: [email or web address]

## 2023-05-17 DIAGNOSIS — M6281 Muscle weakness (generalized): Secondary | ICD-10-CM | POA: Diagnosis not present

## 2023-05-17 DIAGNOSIS — R2689 Other abnormalities of gait and mobility: Secondary | ICD-10-CM | POA: Diagnosis not present

## 2023-05-18 ENCOUNTER — Other Ambulatory Visit: Payer: Self-pay | Admitting: Adult Health

## 2023-05-18 ENCOUNTER — Other Ambulatory Visit: Payer: Self-pay | Admitting: Cardiology

## 2023-05-19 DIAGNOSIS — J4520 Mild intermittent asthma, uncomplicated: Secondary | ICD-10-CM | POA: Diagnosis not present

## 2023-05-20 ENCOUNTER — Ambulatory Visit (INDEPENDENT_AMBULATORY_CARE_PROVIDER_SITE_OTHER): Payer: Medicare PPO

## 2023-05-20 ENCOUNTER — Ambulatory Visit: Payer: Medicare PPO | Admitting: Adult Health

## 2023-05-20 ENCOUNTER — Encounter: Payer: Self-pay | Admitting: Adult Health

## 2023-05-20 VITALS — BP 130/70 | HR 69

## 2023-05-20 DIAGNOSIS — J4540 Moderate persistent asthma, uncomplicated: Secondary | ICD-10-CM

## 2023-05-20 DIAGNOSIS — J45909 Unspecified asthma, uncomplicated: Secondary | ICD-10-CM | POA: Insufficient documentation

## 2023-05-20 DIAGNOSIS — J4541 Moderate persistent asthma with (acute) exacerbation: Secondary | ICD-10-CM

## 2023-05-20 DIAGNOSIS — K219 Gastro-esophageal reflux disease without esophagitis: Secondary | ICD-10-CM | POA: Diagnosis not present

## 2023-05-20 DIAGNOSIS — J3089 Other allergic rhinitis: Secondary | ICD-10-CM

## 2023-05-20 DIAGNOSIS — J302 Other seasonal allergic rhinitis: Secondary | ICD-10-CM

## 2023-05-20 DIAGNOSIS — J849 Interstitial pulmonary disease, unspecified: Secondary | ICD-10-CM | POA: Diagnosis not present

## 2023-05-20 NOTE — Assessment & Plan Note (Addendum)
Slow to resolve asthmatic bronchitic exacerbation.  Patient has been treated with prolonged course of antibiotics and steroids.  High-resolution CT chest showed no interstitial lung disease.  There was some tree-in-bud nodularity in the right middle lobe.  Will continue treatment aimed at chronic rhinitis and reflux prevention.  Aspiration precautions discussed.  Continue with bronchodilators and cough control regimen.  Patient has finished her course of prednisone.  Going forward may need to check allergy panel with IgE and CBC with differential to look for allergy component.  PFTs are pending next month.  Chest x-ray today shows no acute pneumonia.  Plan  Patient Instructions  Chest xray today.  Take Delsym 2 tsp Twice daily  for cough As needed   Take Tessalon Three times a day  for cough as needed.  Hydromet 1 tsp for severe cough , use with caution. This may make you sleep and off balance, only use for severe cough.  Continue on Budesonide and Brovana neb Twice daily   Albuterol inhaler or neb As needed   Allegra 180mg  in am.  Flonase nasal daily  Pepcid 20mg  At bedtime   Use sips of water to soothe throat and avoid throat clearing.  Follow up in 6 week with Dr. Francine Graven with PFT as planned and As needed   Please contact office for sooner follow up if symptoms do not improve or worsen or seek emergency care

## 2023-05-20 NOTE — Assessment & Plan Note (Signed)
Continue on GERD treatment.  Aspiration precautions.  Continue with head of bed elevated.

## 2023-05-20 NOTE — Assessment & Plan Note (Signed)
Continue on aggressive maintenance regimen with Allegra and Flonase.

## 2023-05-20 NOTE — Progress Notes (Signed)
@Patient  ID: Vicki Morris, female    DOB: 12-24-1935, 87 y.o.   MRN: [id number]  Chief Complaint  Patient presents with   Follow-up    Referring provider: Merri Brunette, MD  HPI: 87 year old female never smoker seen for pulmonary consult March 17, 2023 for chronic cough and recurrent bronchitis Medical history significant for atrial fibrillation  TEST/EVENTS :  High-resolution CT chest that was done on March 30, 2023 that showed bandlike scarring in the bilateral lung bases, negative for interstitial lung disease. Minimal emphysema. And some tree-in-bud nodules in the right middle lobe. Mild tracheobronchomalacia    PFT pending   05/20/2023 Follow up: Asthmatic Bronchitis  Patient returns for a 3-week follow-up.  Patient has been treated for slow to resolve asthmatic bronchitis with possible superimposed pneumonia.  She was treated with a 10-day course of Augmentin and prolonged prednisone course.  Chest x-ray on April 26, 2023 showed increased markings along the right midlung.  Has been treated for an aggressive cough control regimen with Delsym and Tessalon Perles.  Protonix and Pepcid.  Budesonide and Brovana nebulizer.  Since last visit patient is feeling better, cough and wheezing are decreased . Not totally gone but much better.  She takes her last dose of prednisone today.  Cough is minimally productive. She has upcoming pulmonary function testing next month.  Following with urology for chronic UTIs. Has been having balance issues.  Going to PT. patient had a fall about 10 days ago she had significant bruising and hematoma along her arm and hip area.  Patient says areas are getting better.  She is no trouble using her hand or wrist or weightbearing. No history of amiodarone use. Previous use of macrodantin, not currently taking . Unclear what antibiotic she is on. She will look when she gets home and message [place] the name.   Allergies  Allergen Reactions   Other Anaphylaxis and  Other (See Comments)    Red meat, Pork, Beef, Lamb   Beef-Derived Products    Contrast Media [Iodinated Contrast Media] Hives   Meat Extract     Other reaction(s): anaphylaxis   Sulfonamide Derivatives Other (See Comments)    Unknown   Clarithromycin Nausea And Vomiting   Nabumetone Rash    Immunization History  Administered Date(s) Administered   Influenza Split 07/06/2011, 07/18/2012, 07/05/2014   Influenza Whole 08/05/2008, 07/05/2009   Influenza, High Dose Seasonal PF 07/06/2015   Influenza,inj,Quad PF,6+ Mos 06/21/2013   Pneumococcal Polysaccharide-23 10/05/2002, 03/20/2009   Tdap 02/25/2018    Past Medical History:  Diagnosis Date   Allergic rhinitis, cause unspecified    Anxiety    Arthritis    Chronic anticoagulation    Depression    Dysrhythmia    afib    GERD (gastroesophageal reflux disease)    History of echocardiogram    Echo 7/22: EF 55-60, no RWMA, mild asymmetric LVH, normal RVSF, mild LAE, trivial MR, mild AV sclerosis without stenosis   History of TIAs    Hyperlipidemia    Hypertension    Hypothyroidism    Normal nuclear stress test 12/2011   EF 64%, clinically and electrically negative for ischemia. Myoview scan with apical thinning, could not exclude small subendocardial scar. No ischemia. // Myoview 7/22: EF 58, no ischemia, low risk   Obesity    PAF (paroxysmal atrial fibrillation) (HCC)    Peripheral vascular disease (HCC)    Pneumonia    hx of    Polyp of nasal cavity  PONV (postoperative nausea and vomiting)    Stroke (HCC)    mini stroke  years ago    Urinary tract infection    hx of     Tobacco History: Social History   Tobacco Use  Smoking Status Never  Smokeless Tobacco Never   Counseling given: Not Answered   Outpatient Medications Prior to Visit  Medication Sig Dispense Refill   acetaminophen (TYLENOL) 500 MG tablet Take 1,000 mg by mouth every 6 (six) hours as needed for moderate pain or headache.     albuterol  (PROVENTIL) (2.5 MG/3ML) 0.083% nebulizer solution Take 3 mLs (2.5 mg total) by nebulization every 6 (six) hours as needed for wheezing or shortness of breath. 84 each 5   albuterol (VENTOLIN HFA) 108 (90 Base) MCG/ACT inhaler Inhale 2 puffs into the lungs every 6 (six) hours as needed for wheezing or shortness of breath. 8 g 6   amoxicillin-clavulanate (AUGMENTIN) 875-125 MG tablet Take 1 tablet by mouth 2 (two) times daily. (Patient not taking: Reported on 05/03/2023) 6 tablet 0   arformoterol (BROVANA) 15 MCG/2ML NEBU Take 2 mLs (15 mcg total) by nebulization 2 (two) times daily. 120 mL 6   benzonatate (TESSALON) 200 MG capsule Take 1 capsule (200 mg total) by mouth 3 (three) times daily as needed. 60 capsule 1   budesonide (PULMICORT) 0.5 MG/2ML nebulizer solution Take 2 mLs (0.5 mg total) by nebulization 2 (two) times daily. 120 mL 5   buPROPion (WELLBUTRIN XL) 150 MG 24 hr tablet Take 150 mg by mouth daily.     Calcium Carb-Cholecalciferol 600-10 MG-MCG TABS Take 1 tablet by mouth daily.     denosumab (PROLIA) 60 MG/ML SOSY injection Inject 60 mg into the skin every 6 (six) months.     diltiazem (CARDIZEM CD) 240 MG 24 hr capsule Take 240 mg by mouth at bedtime.      EPINEPHrine 0.3 mg/0.3 mL IJ SOAJ injection Inject 0.3 mg into the muscle as needed for anaphylaxis.     ezetimibe (ZETIA) 10 MG tablet TAKE ONE TABLET BY MOUTH EVERY EVENING 30 tablet 0   ferrous sulfate 325 (65 FE) MG EC tablet Take 325 mg by mouth daily with breakfast.     HYDROcodone bit-homatropine (HYDROMET) 5-1.5 MG/5ML syrup Take 5 mLs by mouth every 6 (six) hours as needed for cough. 120 mL 0   olmesartan-hydrochlorothiazide (BENICAR HCT) 40-25 MG tablet Take 1 tablet by mouth daily.     omeprazole (PRILOSEC) 20 MG capsule Take 20 mg by mouth daily.     polyethylene glycol (MIRALAX / GLYCOLAX) 17 g packet Take 17 g by mouth daily as needed for mild constipation.     Potassium Chloride ER 20 MEQ TBCR Take 1 tablet by mouth  every other day.     predniSONE (DELTASONE) 10 MG tablet 4 tabs for 2 days, then 3 tabs for 2 days, 2 tabs for 2 days, then 1 tab for 2 days, then stop 20 tablet 0   predniSONE (DELTASONE) 10 MG tablet 1 tab daily for 7 days, then 1/2 daily for 7 days 14 tablet 0   rivaroxaban (XARELTO) 20 MG TABS tablet Take 20 mg by mouth daily with supper.     rosuvastatin (CRESTOR) 20 MG tablet TAKE ONE TABLET BY MOUTH EVERY EVENING 30 tablet 0   sertraline (ZOLOFT) 50 MG tablet Take 50 mg by mouth at bedtime.      SYNTHROID 100 MCG tablet Take 100 mcg by mouth daily before breakfast.  0   VITAMIN D PO Take by mouth daily.     No facility-administered medications prior to visit.     Review of Systems:   Constitutional:   No  weight loss, night sweats,  Fevers, chills,  +fatigue, or  lassitude.  HEENT:   No headaches,  Difficulty swallowing,  Tooth/dental problems, or  Sore throat,                No sneezing, itching, ear ache, nasal congestion, post nasal drip,   CV:  No chest pain,  Orthopnea, PND, swelling in lower extremities, anasarca, dizziness, palpitations, syncope.   GI  No heartburn, indigestion, abdominal pain, nausea, vomiting, diarrhea, change in bowel habits, loss of appetite, bloody stools.   Resp:  No chest wall deformity  Skin: no rash or lesions.  GU: no dysuria, change in color of urine, no urgency or frequency.  No flank pain, no hematuria   MS:  No joint pain or swelling.  No decreased range of motion.  No back pain.    Physical Exam  BP 130/70   Pulse 69   SpO2 97%   GEN: A/Ox3; pleasant , NAD, well nourished, elderly ,rolling Eilts    HEENT:  [place]/AT,  OSE-clear, THROAT-clear, no lesions, no postnasal drip or exudate noted.   NECK:  Supple w/ fair ROM; no JVD; normal carotid impulses w/o bruits; no thyromegaly or nodules palpated; no lymphadenopathy.    RESP  Clear  P & A; w/o, wheezes/ rales/ or rhonchi. no accessory muscle use, no dullness to  percussion  CARD:  RRR, no m/r/g, tr  peripheral edema, pulses intact, no cyanosis or clubbing.  GI:   Soft & nt; nml bowel sounds; no organomegaly or masses detected.   Musco: Warm bil, no deformities or joint swelling noted.   Neuro: alert, no focal deficits noted.    Skin: Warm, bruising along right forearm small hematoma noted.  Large bruising along right hip with hematoma noted various stages of healing .     Lab Results:  CBC   BMET   BNP No results found for: "BNP"   Imaging: DG Chest 2 View  Result Date: 05/20/2023 CLINICAL DATA:  Pneumonia. EXAM: CHEST - 2 VIEW COMPARISON:  04/26/2023 FINDINGS: The lungs are clear without focal pneumonia, edema, pneumothorax or pleural effusion. Interstitial markings are diffusely coarsened with chronic features. Cardiopericardial silhouette is at upper limits of normal for size. No acute bony abnormality. IMPRESSION: Chronic interstitial coarsening without acute cardiopulmonary findings. Electronically Signed   By: Kennith Center M.D.   On: 05/20/2023 11:44   DG Chest 2 View  Result Date: 04/26/2023 CLINICAL DATA:  Cough EXAM: CHEST - 2 VIEW COMPARISON:  10/25/2021, 03/30/2023 FINDINGS: The heart size and mediastinal contours are within normal limits. Aortic atherosclerosis. Small area of prominent interstitial markings in the right mid lung. No pleural effusion or pneumothorax. The visualized skeletal structures are unremarkable. IMPRESSION: Small area of prominent interstitial markings in the right mid lung, which may reflect a site of atypical infection or inflammation. Electronically Signed   By: Duanne Guess D.O.   On: 04/26/2023 12:14    albuterol (PROVENTIL) (2.5 MG/3ML) 0.083% nebulizer solution 2.5 mg     Date Action Dose Route User   Discharged on 05/06/2023   Admitted on 05/05/2023   04/26/2023 1136 Given 2.5 mg Nebulization Ellin Saba, Ledell Peoples, RN      methylPREDNISolone acetate (DEPO-MEDROL) injection 120 mg      Date Action  Dose Route User   Discharged on 05/06/2023   Admitted on 05/05/2023   04/26/2023 1133 Given 120 mg Intramuscular (Left Ventrogluteal) Ellin Saba, Ledell Peoples, RN           No data to display          No results found for: "NITRICOXIDE"      Assessment & Plan:   Asthmatic bronchitis Slow to resolve asthmatic bronchitic exacerbation.  Patient has been treated with prolonged course of antibiotics and steroids.  High-resolution CT chest showed no interstitial lung disease.  There was some tree-in-bud nodularity in the right middle lobe.  Will continue treatment aimed at chronic rhinitis and reflux prevention.  Aspiration precautions discussed.  Continue with bronchodilators and cough control regimen.  Patient has finished her course of prednisone.  Going forward may need to check allergy panel with IgE and CBC with differential to look for allergy component.  PFTs are pending next month.  Chest x-ray today shows no acute pneumonia.  Plan  Patient Instructions  Chest xray today.  Take Delsym 2 tsp Twice daily  for cough As needed   Take Tessalon Three times a day  for cough as needed.  Hydromet 1 tsp for severe cough , use with caution. This may make you sleep and off balance, only use for severe cough.  Continue on Budesonide and Brovana neb Twice daily   Albuterol inhaler or neb As needed   Allegra 180mg  in am.  Flonase nasal daily  Pepcid 20mg  At bedtime   Use sips of water to soothe throat and avoid throat clearing.  Follow up in 6 week with Dr. Francine Graven with PFT as planned and As needed   Please contact office for sooner follow up if symptoms do not improve or worsen or seek emergency care       Seasonal and perennial allergic rhinitis Continue on aggressive maintenance regimen with Allegra and Flonase.  GERD Continue on GERD treatment.  Aspiration precautions.  Continue with head of bed elevated.     Rubye Oaks, NP 05/20/2023

## 2023-05-20 NOTE — Patient Instructions (Addendum)
Chest xray today.  Take Delsym 2 tsp Twice daily  for cough As needed   Take Tessalon Three times a day  for cough as needed.  Hydromet 1 tsp for severe cough , use with caution. This may make you sleep and off balance, only use for severe cough.  Continue on Budesonide and Brovana neb Twice daily   Albuterol inhaler or neb As needed   Allegra 180mg  in am.  Flonase nasal daily  Pepcid 20mg  At bedtime   Use sips of water to soothe throat and avoid throat clearing.  Follow up in 6 week with Dr. Francine Graven with PFT as planned and As needed   Please contact office for sooner follow up if symptoms do not improve or worsen or seek emergency care

## 2023-05-25 ENCOUNTER — Ambulatory Visit: Payer: Medicare PPO | Admitting: Adult Health

## 2023-06-17 ENCOUNTER — Other Ambulatory Visit: Payer: Self-pay | Admitting: Adult Health

## 2023-06-17 ENCOUNTER — Other Ambulatory Visit: Payer: Self-pay | Admitting: Cardiology

## 2023-06-19 DIAGNOSIS — J4520 Mild intermittent asthma, uncomplicated: Secondary | ICD-10-CM | POA: Diagnosis not present

## 2023-06-30 ENCOUNTER — Ambulatory Visit: Payer: Medicare PPO | Admitting: Pulmonary Disease

## 2023-06-30 ENCOUNTER — Encounter: Payer: Self-pay | Admitting: Pulmonary Disease

## 2023-06-30 VITALS — BP 134/84 | HR 66 | Ht 64.0 in | Wt 211.4 lb

## 2023-06-30 DIAGNOSIS — R053 Chronic cough: Secondary | ICD-10-CM | POA: Diagnosis not present

## 2023-06-30 DIAGNOSIS — J849 Interstitial pulmonary disease, unspecified: Secondary | ICD-10-CM

## 2023-06-30 DIAGNOSIS — J4540 Moderate persistent asthma, uncomplicated: Secondary | ICD-10-CM | POA: Diagnosis not present

## 2023-06-30 LAB — PULMONARY FUNCTION TEST
DL/VA % pred: 104 %
DL/VA: 4.24 ml/min/mmHg/L
DLCO cor % pred: 115 %
DLCO cor: 21.35 ml/min/mmHg
DLCO unc % pred: 118 %
DLCO unc: 21.98 ml/min/mmHg
FEF 25-75 Post: 1.27 L/sec
FEF 25-75 Pre: 0.95 L/sec
FEF2575-%Change-Post: 33 %
FEF2575-%Pred-Post: 110 %
FEF2575-%Pred-Pre: 82 %
FEV1-%Change-Post: 7 %
FEV1-%Pred-Post: 75 %
FEV1-%Pred-Pre: 70 %
FEV1-Post: 1.34 L
FEV1-Pre: 1.24 L
FEV1FVC-%Change-Post: 0 %
FEV1FVC-%Pred-Pre: 102 %
FEV6-%Change-Post: 7 %
FEV6-%Pred-Post: 79 %
FEV6-%Pred-Pre: 74 %
FEV6-Post: 1.79 L
FEV6-Pre: 1.67 L
FEV6FVC-%Change-Post: 0 %
FEV6FVC-%Pred-Post: 106 %
FEV6FVC-%Pred-Pre: 106 %
FVC-%Change-Post: 7 %
FVC-%Pred-Post: 74 %
FVC-%Pred-Pre: 69 %
FVC-Post: 1.79 L
FVC-Pre: 1.67 L
Post FEV1/FVC ratio: 75 %
Post FEV6/FVC ratio: 100 %
Pre FEV1/FVC ratio: 74 %
Pre FEV6/FVC Ratio: 100 %
RV % pred: 82 %
RV: 2.06 L
TLC % pred: 77 %
TLC: 3.92 L

## 2023-06-30 LAB — SEDIMENTATION RATE: Sed Rate: 34 mm/hr — ABNORMAL HIGH (ref 0–30)

## 2023-06-30 LAB — C-REACTIVE PROTEIN: CRP: <1 mg/dL (ref 0.5–20.0)

## 2023-06-30 NOTE — Patient Instructions (Signed)
Your CT chest shows mild scarring at the bases of your lungs and the breathing tests show mild restriction (lungs are a bit stiffer than normal).  Continue budesonide and brovana nebulizer treatments  We will Check inflammatory labs today to evaluate for auto-immune conditions that can lead to scarring on the lungs  Follow up in 6 months

## 2023-06-30 NOTE — Patient Instructions (Signed)
Full PFT performed today. °

## 2023-06-30 NOTE — Progress Notes (Signed)
Full PFT performed today. °

## 2023-06-30 NOTE — Progress Notes (Signed)
Synopsis: Referred in June 2024 for Chronic Cough by Linus Galas, NP  Subjective:   PATIENT ID: Vicki Morris GENDER: female DOB: 10-02-1936, MRN: [id number]  HPI  Chief Complaint  Patient presents with   Follow-up    Pt is here for PFT and F/U to discuss results.   Vicki Morris is an 87 year old woman, never smoker with history of GERD, hypertension, hypothyroidism, paroxysmal atrial fibrillation and obesity who is referred to pulmonary clinic for cough.   Initial OV 03/17/23 She reports having cough for 2 months The cough can make her short of breath. This cough started May 8. She has had prior episodes of prolonged cough symptoms. She has been on course of steroids and breathing treatment and antibiotics.   She had covid 9/23 with prolonged cough symptoms. She has had several boughts of bronchitis.   She has night time awakenings due to cough. Once in a while she will feel like food gets caught in her throat. No post nasal drip. She had sinus surgery many years ago. She took allergy shots for years. She is not taking any allergy medicines. No seasonal component to her cough. She is taking pantoprazole 40mg  daily for GERD, which was a recent change. She was started on breztri inhaler but she is having a hard time using this.   She has knee and bilateral hip pains. She has watery eyes.   She had significant second hand smoke exposure. Her husband died from lung cancer and COPD.   Today OV 06/30/23 She was seen 05/20/23 by Rubye Oaks, NP for on going cough issues. She was started on delsym cough syrup and tessalon perles and hydromet for severe cough.   The patient, with a history of chronic cough and shortness of breath, presents for follow-up. They report that their cough has improved but still occurs in spells. They also report increasing shortness of breath, especially with activity, and describe it as "getting so out of breath doing stuff." They also mention that they have an  upcoming appointment with their cardiologist.  The patient also reports a sensation of tightness in the middle of their chest, which they describe as not being painful but noticeable. This sensation does not appear to be related to activity or reflux symptoms. They also report numbness in their toes, which they describe as feeling like they are asleep, and a dry mouth. They have a history of borderline diabetes and were previously on Ozempic, but had to stop due to side effects.  The patient is currently on nebulizer treatments with budesonide and Brovana, which they believe have helped reduce their cough. They also report that they have been using a home oxygen monitor and have noticed that their oxygen levels drop to 93% and their heart rate increases to the 80s with activity, such as bathing.  Past Medical History:  Diagnosis Date   Allergic rhinitis, cause unspecified    Anxiety    Arthritis    Chronic anticoagulation    Depression    Dysrhythmia    afib    GERD (gastroesophageal reflux disease)    History of echocardiogram    Echo 7/22: EF 55-60, no RWMA, mild asymmetric LVH, normal RVSF, mild LAE, trivial MR, mild AV sclerosis without stenosis   History of TIAs    Hyperlipidemia    Hypertension    Hypothyroidism    Normal nuclear stress test 12/2011   EF 64%, clinically and electrically negative for ischemia. Myoview scan with apical  thinning, could not exclude small subendocardial scar. No ischemia. // Myoview 7/22: EF 58, no ischemia, low risk   Obesity    PAF (paroxysmal atrial fibrillation) (HCC)    Peripheral vascular disease (HCC)    Pneumonia    hx of    Polyp of nasal cavity    PONV (postoperative nausea and vomiting)    Stroke (HCC)    mini stroke  years ago    Urinary tract infection    hx of      Family History  Problem Relation Age of Onset   Heart disease Mother    Heart disease Father    Heart attack Father    Breast cancer Paternal Aunt      Social  History   Socioeconomic History   Marital status: Widowed    Spouse name: Not on file   Number of children: Not on file   Years of education: Not on file   Highest education level: Not on file  Occupational History   Occupation: Retired  Tobacco Use   Smoking status: Never   Smokeless tobacco: Never  Vaping Use   Vaping status: Never Used  Substance and Sexual Activity   Alcohol use: No   Drug use: No   Sexual activity: Never  Other Topics Concern   Not on file  Social History Narrative   Not on file   Social Determinants of Health   Financial Resource Strain: Not on file  Food Insecurity: Not on file  Transportation Needs: Not on file  Physical Activity: Not on file  Stress: Not on file  Social Connections: Not on file  Intimate Partner Violence: Not on file     Allergies  Allergen Reactions   Other Anaphylaxis and Other (See Comments)    Red meat, Pork, Beef, Lamb   Beef-Derived Products    Contrast Media [Iodinated Contrast Media] Hives   Meat Extract     Other reaction(s): anaphylaxis   Sulfonamide Derivatives Other (See Comments)    Unknown   Clarithromycin Nausea And Vomiting   Nabumetone Rash     Outpatient Medications Prior to Visit  Medication Sig Dispense Refill   acetaminophen (TYLENOL) 500 MG tablet Take 1,000 mg by mouth every 6 (six) hours as needed for moderate pain or headache.     albuterol (PROVENTIL) (2.5 MG/3ML) 0.083% nebulizer solution Take 3 mLs (2.5 mg total) by nebulization every 6 (six) hours as needed for wheezing or shortness of breath. 84 each 5   albuterol (VENTOLIN HFA) 108 (90 Base) MCG/ACT inhaler Inhale 2 puffs into the lungs every 6 (six) hours as needed for wheezing or shortness of breath. 8 g 6   arformoterol (BROVANA) 15 MCG/2ML NEBU Take 2 mLs (15 mcg total) by nebulization 2 (two) times daily. 120 mL 6   benzonatate (TESSALON) 200 MG capsule Take 1 capsule (200 mg total) by mouth 3 (three) times daily as needed. 60 capsule  1   budesonide (PULMICORT) 0.5 MG/2ML nebulizer solution Take 2 mLs (0.5 mg total) by nebulization 2 (two) times daily. 120 mL 5   buPROPion (WELLBUTRIN XL) 150 MG 24 hr tablet Take 150 mg by mouth daily.     Calcium Carb-Cholecalciferol 600-10 MG-MCG TABS Take 1 tablet by mouth daily.     denosumab (PROLIA) 60 MG/ML SOSY injection Inject 60 mg into the skin every 6 (six) months.     diltiazem (CARDIZEM CD) 240 MG 24 hr capsule Take 240 mg by mouth at bedtime.  EPINEPHrine 0.3 mg/0.3 mL IJ SOAJ injection Inject 0.3 mg into the muscle as needed for anaphylaxis.     ezetimibe (ZETIA) 10 MG tablet TAKE ONE TABLET BY MOUTH EVERY EVENING 15 tablet 0   ferrous sulfate 325 (65 FE) MG EC tablet Take 325 mg by mouth daily with breakfast.     HYDROcodone bit-homatropine (HYDROMET) 5-1.5 MG/5ML syrup Take 5 mLs by mouth every 6 (six) hours as needed for cough. 120 mL 0   olmesartan-hydrochlorothiazide (BENICAR HCT) 40-25 MG tablet Take 1 tablet by mouth daily.     omeprazole (PRILOSEC) 20 MG capsule Take 20 mg by mouth daily.     polyethylene glycol (MIRALAX / GLYCOLAX) 17 g packet Take 17 g by mouth daily as needed for mild constipation.     Potassium Chloride ER 20 MEQ TBCR Take 1 tablet by mouth every other day.     predniSONE (DELTASONE) 10 MG tablet 4 tabs for 2 days, then 3 tabs for 2 days, 2 tabs for 2 days, then 1 tab for 2 days, then stop 20 tablet 0   predniSONE (DELTASONE) 10 MG tablet 1 tab daily for 7 days, then 1/2 daily for 7 days 14 tablet 0   rivaroxaban (XARELTO) 20 MG TABS tablet Take 20 mg by mouth daily with supper.     rosuvastatin (CRESTOR) 20 MG tablet TAKE ONE TABLET BY MOUTH EVERY EVENING 15 tablet 0   sertraline (ZOLOFT) 50 MG tablet Take 50 mg by mouth at bedtime.      SYNTHROID 100 MCG tablet Take 100 mcg by mouth daily before breakfast.   0   VITAMIN D PO Take by mouth daily.     amoxicillin-clavulanate (AUGMENTIN) 875-125 MG tablet Take 1 tablet by mouth 2 (two) times  daily. (Patient not taking: Reported on 05/03/2023) 6 tablet 0   No facility-administered medications prior to visit.   Review of Systems  Constitutional:  Negative for chills, fever, malaise/fatigue and weight loss.  HENT:  Negative for congestion, sinus pain and sore throat.   Eyes: Negative.   Respiratory:  Positive for cough and shortness of breath. Negative for hemoptysis, sputum production and wheezing.   Cardiovascular:  Negative for chest pain, palpitations, orthopnea, claudication and leg swelling.  Gastrointestinal:  Negative for abdominal pain, heartburn, nausea and vomiting.  Genitourinary: Negative.   Musculoskeletal:  Positive for joint pain. Negative for myalgias.  Skin:  Negative for rash.  Neurological:  Negative for weakness.  Endo/Heme/Allergies: Negative.   Psychiatric/Behavioral: Negative.     Objective:   Vitals:   06/30/23 1327  BP: 134/84  Pulse: 66  SpO2: 97%  Weight: 211 lb 6.4 oz (95.9 kg)  Height: 5\' 4"  (1.626 m)    Physical Exam Constitutional:      General: She is not in acute distress.    Appearance: She is obese. She is not ill-appearing.  HENT:     Head: Normocephalic and atraumatic.  Eyes:     General: No scleral icterus.    Conjunctiva/sclera: Conjunctivae normal.  Cardiovascular:     Rate and Rhythm: Normal rate and regular rhythm.     Pulses: Normal pulses.     Heart sounds: Normal heart sounds. No murmur heard. Pulmonary:     Effort: Pulmonary effort is normal.     Breath sounds: Normal breath sounds. No wheezing, rhonchi or rales.  Musculoskeletal:     Right lower leg: No edema.     Left lower leg: No edema.  Skin:  General: Skin is warm and dry.  Neurological:     General: No focal deficit present.     Mental Status: She is alert.    CBC    Component Value Date/Time   WBC 24.1 (H) 05/05/2023 2124   RBC 4.68 05/05/2023 2124   HGB 14.4 05/05/2023 2124   HCT 43.6 05/05/2023 2124   PLT 285 05/05/2023 2124   MCV 93.2  05/05/2023 2124   MCH 30.8 05/05/2023 2124   MCHC 33.0 05/05/2023 2124   RDW 12.9 05/05/2023 2124   LYMPHSABS 1.6 10/25/2021 0624   MONOABS 0.9 10/25/2021 0624   EOSABS 0.4 10/25/2021 0624   BASOSABS 0.1 10/25/2021 0624      Latest Ref Rng & Units 10/25/2021    6:24 AM 09/09/2018   10:44 AM 10/07/2017   12:14 PM  BMP  Glucose 70 - 99 mg/dL 161  096  045   BUN 8 - 23 mg/dL 21  21  13    Creatinine 0.44 - 1.00 mg/dL 4.09  8.11  9.14   BUN/Creat Ratio 12 - 28   18   Sodium 135 - 145 mmol/L 139  142  144   Potassium 3.5 - 5.1 mmol/L 3.2  3.7  4.4   Chloride 98 - 111 mmol/L 103  106  104   CO2 22 - 32 mmol/L 25  25  23    Calcium 8.9 - 10.3 mg/dL 8.9  9.2  9.4    Chest imaging: HRCT 03/30/23 1. Mild, bland appearing and predominantly bandlike scarring of the bilateral lung bases. No evidence of fibrotic interstitial lung disease. 2. Minimal emphysema. 3. Occasional tiny clustered centrilobular and tree-in-bud nodules, for example in the lateral segment right middle lobe, consistent with minimal atypical infection. 4. Mild tracheobronchomalacia on expiratory phase imaging. No significant air trapping. 5. Hiatal hernia. 6. Coronary artery disease.  CXR 10/25/21 Portable AP semi upright view at 0608 hours. Stable lung volumes and mediastinal contours, with eventration of the diaphragm and chronic hiatal hernia. Visualized tracheal air column is within normal limits.   Allowing for portable technique the lungs are clear. No pneumothorax or pleural effusion. No acute osseous abnormality identified  PFT:    Latest Ref Rng & Units 06/30/2023   11:38 AM  PFT Results  FVC-Pre L 1.67  P  FVC-Predicted Pre % 69  P  FVC-Post L 1.79  P  FVC-Predicted Post % 74  P  Pre FEV1/FVC % % 74  P  Post FEV1/FCV % % 75  P  FEV1-Pre L 1.24  P  FEV1-Predicted Pre % 70  P  FEV1-Post L 1.34  P  DLCO uncorrected ml/min/mmHg 21.98  P  DLCO UNC% % 118  P  DLCO corrected ml/min/mmHg 21.35  P  DLCO  COR %Predicted % 115  P  DLVA Predicted % 104  P  TLC L 3.92  P  TLC % Predicted % 77  P  RV % Predicted % 82  P    P Preliminary result    Labs:  Path:  Echo: 2022: EF 55-60%, Grade I diastolic dysfunction, RV size and systolic function  Heart Catheterization:  Assessment & Plan:   No diagnosis found.  Discussion: Vicki Morris is an 87 year old woman, never smoker with history of GERD, hypertension, hypothyroidism, paroxysmal atrial fibrillation and obesity who returns to pulmonary clinic for bronchitis.   She has mild restrictive defect and scattered areas of basilar scarring on HRCT chest. Will check inflammatory labs to  rule out ILD. She has dry mouth symptoms, possible sjogren's disease.   For her bronchitis she is to continue budesonide and brovana.   She is to continue pantoprazole 40mg  daily, 30 minutes before breakfast. Recommend using a wedge pillow when sleeping. Recommend sitting upright for 2-3 hours after meals.  Follow up in 6 months.   Melody Comas, MD [facility] Office: [phone]    Current Outpatient Medications:    acetaminophen (TYLENOL) 500 MG tablet, Take 1,000 mg by mouth every 6 (six) hours as needed for moderate pain or headache., Disp: , Rfl:    albuterol (PROVENTIL) (2.5 MG/3ML) 0.083% nebulizer solution, Take 3 mLs (2.5 mg total) by nebulization every 6 (six) hours as needed for wheezing or shortness of breath., Disp: 84 each, Rfl: 5   albuterol (VENTOLIN HFA) 108 (90 Base) MCG/ACT inhaler, Inhale 2 puffs into the lungs every 6 (six) hours as needed for wheezing or shortness of breath., Disp: 8 g, Rfl: 6   arformoterol (BROVANA) 15 MCG/2ML NEBU, Take 2 mLs (15 mcg total) by nebulization 2 (two) times daily., Disp: 120 mL, Rfl: 6   benzonatate (TESSALON) 200 MG capsule, Take 1 capsule (200 mg total) by mouth 3 (three) times daily as needed., Disp: 60 capsule, Rfl: 1   budesonide (PULMICORT) 0.5 MG/2ML nebulizer  solution, Take 2 mLs (0.5 mg total) by nebulization 2 (two) times daily., Disp: 120 mL, Rfl: 5   buPROPion (WELLBUTRIN XL) 150 MG 24 hr tablet, Take 150 mg by mouth daily., Disp: , Rfl:    Calcium Carb-Cholecalciferol 600-10 MG-MCG TABS, Take 1 tablet by mouth daily., Disp: , Rfl:    denosumab (PROLIA) 60 MG/ML SOSY injection, Inject 60 mg into the skin every 6 (six) months., Disp: , Rfl:    diltiazem (CARDIZEM CD) 240 MG 24 hr capsule, Take 240 mg by mouth at bedtime. , Disp: , Rfl:    EPINEPHrine 0.3 mg/0.3 mL IJ SOAJ injection, Inject 0.3 mg into the muscle as needed for anaphylaxis., Disp: , Rfl:    ezetimibe (ZETIA) 10 MG tablet, TAKE ONE TABLET BY MOUTH EVERY EVENING, Disp: 15 tablet, Rfl: 0   ferrous sulfate 325 (65 FE) MG EC tablet, Take 325 mg by mouth daily with breakfast., Disp: , Rfl:    HYDROcodone bit-homatropine (HYDROMET) 5-1.5 MG/5ML syrup, Take 5 mLs by mouth every 6 (six) hours as needed for cough., Disp: 120 mL, Rfl: 0   olmesartan-hydrochlorothiazide (BENICAR HCT) 40-25 MG tablet, Take 1 tablet by mouth daily., Disp: , Rfl:    omeprazole (PRILOSEC) 20 MG capsule, Take 20 mg by mouth daily., Disp: , Rfl:    polyethylene glycol (MIRALAX / GLYCOLAX) 17 g packet, Take 17 g by mouth daily as needed for mild constipation., Disp: , Rfl:    Potassium Chloride ER 20 MEQ TBCR, Take 1 tablet by mouth every other day., Disp: , Rfl:    predniSONE (DELTASONE) 10 MG tablet, 4 tabs for 2 days, then 3 tabs for 2 days, 2 tabs for 2 days, then 1 tab for 2 days, then stop, Disp: 20 tablet, Rfl: 0   predniSONE (DELTASONE) 10 MG tablet, 1 tab daily for 7 days, then 1/2 daily for 7 days, Disp: 14 tablet, Rfl: 0   rivaroxaban (XARELTO) 20 MG TABS tablet, Take 20 mg by mouth daily with supper., Disp: , Rfl:    rosuvastatin (CRESTOR) 20 MG tablet, TAKE ONE TABLET BY MOUTH EVERY EVENING, Disp: 15 tablet, Rfl: 0   sertraline (ZOLOFT)  50 MG tablet, Take 50 mg by mouth at bedtime. , Disp: , Rfl:     SYNTHROID 100 MCG tablet, Take 100 mcg by mouth daily before breakfast. , Disp: , Rfl: 0   VITAMIN D PO, Take by mouth daily., Disp: , Rfl:

## 2023-07-01 LAB — RHEUMATOID FACTOR: Rheumatoid fact SerPl-aCnc: 10 IU/mL (ref <14)

## 2023-07-05 LAB — HYPERSENSITIVITY PNEUMONITIS
A. Pullulans Abs: NEGATIVE
A.Fumigatus #1 Abs: NEGATIVE
Micropolyspora faeni, IgG: NEGATIVE
Pigeon Serum Abs: NEGATIVE
Thermoact. Saccharii: NEGATIVE
Thermoactinomyces vulgaris, IgG: NEGATIVE

## 2023-07-05 LAB — RNP ANTIBODIES: ENA RNP Ab: <0.2 AI (ref 0.0–0.9)

## 2023-07-06 LAB — ANA: Anti Nuclear Antibody (ANA): NEGATIVE

## 2023-07-06 LAB — CYCLIC CITRUL PEPTIDE ANTIBODY, IGG: Cyclic Citrullin Peptide Ab: <16 U

## 2023-07-06 LAB — ANCA SCREEN W REFLEX TITER: ANCA SCREEN: NEGATIVE

## 2023-07-06 LAB — SJOGREN'S SYNDROME ANTIBODS(SSA + SSB)
SSA (Ro) (ENA) Antibody, IgG: <1 AI
SSB (La) (ENA) Antibody, IgG: <1 AI

## 2023-07-06 LAB — ANTI-SMITH ANTIBODY: ENA SM Ab Ser-aCnc: <1 AI

## 2023-07-16 ENCOUNTER — Telehealth: Payer: Self-pay | Admitting: Cardiology

## 2023-07-16 ENCOUNTER — Other Ambulatory Visit: Payer: Self-pay | Admitting: *Deleted

## 2023-07-16 MED ORDER — EZETIMIBE 10 MG PO TABS: 10.0000 mg | ORAL_TABLET | Freq: Every evening | ORAL | 0 refills | Status: DC

## 2023-07-16 MED ORDER — ROSUVASTATIN CALCIUM 20 MG PO TABS: 20.0000 mg | ORAL_TABLET | Freq: Every evening | ORAL | 0 refills | Status: DC

## 2023-07-16 NOTE — Telephone Encounter (Signed)
*  STAT* If patient is at the pharmacy, call can be transferred to refill team.   1. Which medications need to be refilled? (please list name of each medication and dose if known)   rosuvastatin (CRESTOR) 20 MG tablet  ezetimibe (ZETIA) 10 MG tablet   2. Which pharmacy/location (including street and city if local pharmacy) is medication to be sent to?  [facility] - [place], [place] - [address]      3. Do they need a 30 day or 90 day supply? 90 day    Pt is out of medication and scheduled for office visit on 11/11.

## 2023-07-19 ENCOUNTER — Other Ambulatory Visit: Payer: Self-pay | Admitting: Adult Health

## 2023-07-19 ENCOUNTER — Other Ambulatory Visit: Payer: Self-pay | Admitting: Cardiology

## 2023-07-19 DIAGNOSIS — J4520 Mild intermittent asthma, uncomplicated: Secondary | ICD-10-CM | POA: Diagnosis not present

## 2023-07-27 DIAGNOSIS — E876 Hypokalemia: Secondary | ICD-10-CM | POA: Diagnosis not present

## 2023-07-27 DIAGNOSIS — Z23 Encounter for immunization: Secondary | ICD-10-CM | POA: Diagnosis not present

## 2023-07-27 DIAGNOSIS — E782 Mixed hyperlipidemia: Secondary | ICD-10-CM | POA: Diagnosis not present

## 2023-07-27 DIAGNOSIS — E559 Vitamin D deficiency, unspecified: Secondary | ICD-10-CM | POA: Diagnosis not present

## 2023-07-27 DIAGNOSIS — I4891 Unspecified atrial fibrillation: Secondary | ICD-10-CM | POA: Diagnosis not present

## 2023-07-27 DIAGNOSIS — Z7901 Long term (current) use of anticoagulants: Secondary | ICD-10-CM | POA: Diagnosis not present

## 2023-07-27 DIAGNOSIS — E1122 Type 2 diabetes mellitus with diabetic chronic kidney disease: Secondary | ICD-10-CM | POA: Diagnosis not present

## 2023-07-27 DIAGNOSIS — E039 Hypothyroidism, unspecified: Secondary | ICD-10-CM | POA: Diagnosis not present

## 2023-07-27 DIAGNOSIS — M810 Age-related osteoporosis without current pathological fracture: Secondary | ICD-10-CM | POA: Diagnosis not present

## 2023-07-30 ENCOUNTER — Telehealth: Payer: Self-pay | Admitting: Internal Medicine

## 2023-07-30 NOTE — Telephone Encounter (Signed)
Patient was in office last month. Blood work results have been released on MyChart but daughter does not understand it. Please call back with blood work results. Call back number [phone]

## 2023-08-02 DIAGNOSIS — H26493 Other secondary cataract, bilateral: Secondary | ICD-10-CM | POA: Diagnosis not present

## 2023-08-02 DIAGNOSIS — H35033 Hypertensive retinopathy, bilateral: Secondary | ICD-10-CM | POA: Diagnosis not present

## 2023-08-02 DIAGNOSIS — H04123 Dry eye syndrome of bilateral lacrimal glands: Secondary | ICD-10-CM | POA: Diagnosis not present

## 2023-08-02 DIAGNOSIS — H353132 Nonexudative age-related macular degeneration, bilateral, intermediate dry stage: Secondary | ICD-10-CM | POA: Diagnosis not present

## 2023-08-05 DIAGNOSIS — R7303 Prediabetes: Secondary | ICD-10-CM | POA: Diagnosis not present

## 2023-08-05 DIAGNOSIS — E876 Hypokalemia: Secondary | ICD-10-CM | POA: Diagnosis not present

## 2023-08-07 NOTE — Telephone Encounter (Signed)
Patient's inflammatory lab work up is negative for autoimmune disesae. The only abnormality in her labs were an elevated sed rate which is a general inflammatory marker.   Dr. Francine Graven

## 2023-08-07 NOTE — Telephone Encounter (Signed)
Mychart sent to pt of response from Dr. Francine Graven.

## 2023-08-11 NOTE — Progress Notes (Unsigned)
Cardiology Office Note   Date:  08/11/2023   ID:  Vicki Morris, DOB December 22, 1935, MRN [id number]  PCP:  Merri Brunette, MD  Cardiologist:   Oral Remache [place], MD   No chief complaint on file.     History of Present Illness: Vicki Morris is a 87 y.o. female who presents for follow up Afib and chest pain. She has a PMH of HTN, HLD, hypothyroidism, PAF on chronic Coumadin therapy, GERD and a history of TIA.  Myoview and echo performed in June 2015 were unremarkable.   She was seen in the emergency room in December 2018 for chest pain.  She was admitted briefly.  Serial troponin was negative.  Potassium on arrival was low, however this was repleted.  Echocardiogram obtained on 09/21/2017 showed EF 55 to 60%, grade 1 DD, trivial mitral regurgitation, trivial aortic regurgitation, moderately dilated left atrium.  She was seen in the hospital by cardiology service, chest pain was felt to be very atypical.  Outpatient stress test was recommended.  She had Myoview performed on 09/30/2017 which showed EF 51%, small defect of mild severity in the basal inferolateral location which is partially reversible in the likely related to variation in the diaphragmatic attenuation artifact.  There was small defect of moderate severity in the apex location which is nonreversible consistent with attenuation artifact as well, no obvious ischemia was noted.  Overall this is considered a low risk study.    In December 2019 she had left knee surgery without complications. On follow up today she is still somewhat deconditioned. She enjoys working in her flowers. Denies any real episodes of Afib but occasionally will note a skipped beat.   She was seen in 2022 for clearance for GI procedure. Myoview was low risk and Echo was normal. In Jan 2023 seen by Bernadene Person NP for atypical chest pain. Really pain is localized to medial to the right scapula. Myoview again done and was normal. She states this is helped with Tylenol and  rest but limits her activity and being able to play the piano.     Past Medical History:  Diagnosis Date   Allergic rhinitis, cause unspecified    Anxiety    Arthritis    Chronic anticoagulation    Depression    Dysrhythmia    afib    GERD (gastroesophageal reflux disease)    History of echocardiogram    Echo 7/22: EF 55-60, no RWMA, mild asymmetric LVH, normal RVSF, mild LAE, trivial MR, mild AV sclerosis without stenosis   History of TIAs    Hyperlipidemia    Hypertension    Hypothyroidism    Normal nuclear stress test 12/2011   EF 64%, clinically and electrically negative for ischemia. Myoview scan with apical thinning, could not exclude small subendocardial scar. No ischemia. // Myoview 7/22: EF 58, no ischemia, low risk   Obesity    PAF (paroxysmal atrial fibrillation) (HCC)    Peripheral vascular disease (HCC)    Pneumonia    hx of    Polyp of nasal cavity    PONV (postoperative nausea and vomiting)    Stroke (HCC)    mini stroke  years ago    Urinary tract infection    hx of     Past Surgical History:  Procedure Laterality Date   BLADDER SURGERY     CARPAL TUNNEL RELEASE     KNEE ARTHROSCOPY Left 09/14/2018   Procedure: PARITAL MEDIAL, PARTIAL LATERAL MENISCECTOMY, PATELLA FEMORAL SUBCHONDROPLASTY, TIBIAL  PLATEAU KNEE ARTHROSCOPY;  Surgeon: Jodi Geralds, MD;  Location: [facility];  Service: Orthopedics;  Laterality: Left;   NASAL SINUS SURGERY     right little toe surgery      TOTAL ABDOMINAL HYSTERECTOMY W/ BILATERAL SALPINGOOPHORECTOMY     US ECHOCARDIOGRAPHY  01/23/2010   EF 55-60%     Current Outpatient Medications  Medication Sig Dispense Refill   acetaminophen (TYLENOL) 500 MG tablet Take 1,000 mg by mouth every 6 (six) hours as needed for moderate pain or headache.     albuterol (PROVENTIL) (2.5 MG/3ML) 0.083% nebulizer solution Take 3 mLs (2.5 mg total) by nebulization every 6 (six) hours as needed for wheezing or shortness of breath. 84 each 5    albuterol (VENTOLIN HFA) 108 (90 Base) MCG/ACT inhaler Inhale 2 puffs into the lungs every 6 (six) hours as needed for wheezing or shortness of breath. 8 g 6   arformoterol (BROVANA) 15 MCG/2ML NEBU Take 2 mLs (15 mcg total) by nebulization 2 (two) times daily. 120 mL 6   benzonatate (TESSALON) 200 MG capsule Take 1 capsule (200 mg total) by mouth 3 (three) times daily as needed. 60 capsule 1   budesonide (PULMICORT) 0.5 MG/2ML nebulizer solution Take 2 mLs (0.5 mg total) by nebulization 2 (two) times daily. 120 mL 5   buPROPion (WELLBUTRIN XL) 150 MG 24 hr tablet Take 150 mg by mouth daily.     Calcium Carb-Cholecalciferol 600-10 MG-MCG TABS Take 1 tablet by mouth daily.     denosumab (PROLIA) 60 MG/ML SOSY injection Inject 60 mg into the skin every 6 (six) months.     diltiazem (CARDIZEM CD) 240 MG 24 hr capsule Take 240 mg by mouth at bedtime.      EPINEPHrine 0.3 mg/0.3 mL IJ SOAJ injection Inject 0.3 mg into the muscle as needed for anaphylaxis.     ezetimibe (ZETIA) 10 MG tablet TAKE ONE TABLET BY MOUTH EVERY EVENING 30 tablet 0   ferrous sulfate 325 (65 FE) MG EC tablet Take 325 mg by mouth daily with breakfast.     HYDROcodone bit-homatropine (HYDROMET) 5-1.5 MG/5ML syrup Take 5 mLs by mouth every 6 (six) hours as needed for cough. 120 mL 0   olmesartan-hydrochlorothiazide (BENICAR HCT) 40-25 MG tablet Take 1 tablet by mouth daily.     omeprazole (PRILOSEC) 20 MG capsule Take 20 mg by mouth daily.     polyethylene glycol (MIRALAX / GLYCOLAX) 17 g packet Take 17 g by mouth daily as needed for mild constipation.     Potassium Chloride ER 20 MEQ TBCR Take 1 tablet by mouth every other day.     predniSONE (DELTASONE) 10 MG tablet 4 tabs for 2 days, then 3 tabs for 2 days, 2 tabs for 2 days, then 1 tab for 2 days, then stop 20 tablet 0   predniSONE (DELTASONE) 10 MG tablet 1 tab daily for 7 days, then 1/2 daily for 7 days 14 tablet 0   rivaroxaban (XARELTO) 20 MG TABS tablet Take 20 mg by mouth  daily with supper.     rosuvastatin (CRESTOR) 20 MG tablet TAKE ONE TABLET BY MOUTH EVERY EVENING 30 tablet 0   sertraline (ZOLOFT) 50 MG tablet Take 50 mg by mouth at bedtime.      SYNTHROID 100 MCG tablet Take 100 mcg by mouth daily before breakfast.   0   VITAMIN D PO Take by mouth daily.     No current facility-administered medications for this visit.  Allergies:   Other, Beef-derived products, Contrast media [iodinated contrast media], Meat extract, Sulfonamide derivatives, Clarithromycin, and Nabumetone    Social History:  The patient  reports that she has never smoked. She has never used smokeless tobacco. She reports that she does not drink alcohol and does not use drugs.   Family History:  The patient's family history includes Breast cancer in her paternal aunt; Heart attack in her father; Heart disease in her father and mother.    ROS:  Please see the history of present illness.   Otherwise, review of systems are positive for none.   All other systems are reviewed and negative.    PHYSICAL EXAM: VS:  There were no vitals taken for this visit. , BMI There is no height or weight on file to calculate BMI. GEN: Well nourished, well developed, in no acute distress HEENT: normal Neck: no JVD, carotid bruits, or masses Cardiac: RRR; no murmurs, rubs, or gallops,no edema  Respiratory:  clear to auscultation bilaterally, normal work of breathing GI: soft, nontender, nondistended, + BS MS: no deformity or atrophy Skin: warm and dry, no rash Neuro:  Strength and sensation are intact Psych: euthymic mood, full affect   EKG:  EKG is not ordered today. The ekg ordered today demonstrates N/A   Recent Labs: 05/05/2023: Hemoglobin 14.4; Platelets 285   Dated 04/18/21: LFTs normal Dated 12/17/21: cholesterol 172, triglycerides 98, HDL 50. Non HDL 122.  Dated 1.24.23 normal TSH Dated 04/12/23: cholesterol 124, triglycerides 85, HDL 57, LDL 51.   Lipid Panel No results found for:  "CHOL", "TRIG", "HDL", "CHOLHDL", "VLDL", "LDLCALC", "LDLDIRECT"    Wt Readings from Last 3 Encounters:  06/30/23 211 lb 6.4 oz (95.9 kg)  05/05/23 205 lb 0.4 oz (93 kg)  05/03/23 206 lb (93.4 kg)      Other studies Reviewed: Additional studies/ records that were reviewed today include:   Myoview 04/28/21: Study Highlights    The left ventricular ejection fraction is normal (55-65%). Nuclear stress EF: 58%. No T wave inversion was noted during stress. There was no ST segment deviation noted during stress. Defect 1: There is a small defect of moderate severity present in the apex location. This is a low risk study.   Small size, moderate intensity fixed apical perfusion defect, favor artifact or less likely scar. No reversible ischemia. LVEF 58% with normal wall motion. This is a low risk study. A prior apical defect was seen in the 2018 myoview as well.    Echo 04/25/21: IMPRESSIONS     1. Left ventricular ejection fraction, by estimation, is 55 to 60%. The  left ventricle has normal function. The left ventricle has no regional  wall motion abnormalities. Left ventricular diastolic parameters are  consistent with Grade I diastolic  dysfunction (impaired relaxation).   2. Right ventricular systolic function is normal. The right ventricular  size is normal.   3. Trivial mitral valve regurgitation.   Myoview 11/03/21: Study Highlights      The study is normal. The study is low risk.   No ST deviation was noted.   Left ventricular function is normal. Nuclear stress EF: 58 %. The left ventricular ejection fraction is normal (55-65%). End diastolic cavity size is normal. End systolic cavity size is normal.   Prior study available for comparison from 04/28/2021.   Normal stress nuclear study with mild apical thinning but no ischemia.  Gated ejection fraction 58% with normal wall motion.     ASSESSMENT AND PLAN:  Paroxysmal atrial fibrillation (HCC) Maintaining normal  sinus rhythm.  She is tolerating anticoagulation.   Continue Rivaroxaban.  2. HLD- now  on Zetia and crestor.    3. Essential hypertension The patient's blood pressure is controlled on her current regimen.  Continue current therapy.    4. Atypical right scapular pain. Most c/w musculoskeletal pain. Cardiac evaluation normal.       Disposition:   FU with me in 1 year  Signed, Alonah Lineback [place], MD  08/11/2023 9:35 AM    [facility] [address], [place], [place], [address] Phone [phone], Fax [phone]

## 2023-08-12 DIAGNOSIS — J42 Unspecified chronic bronchitis: Secondary | ICD-10-CM | POA: Diagnosis not present

## 2023-08-12 DIAGNOSIS — R051 Acute cough: Secondary | ICD-10-CM | POA: Diagnosis not present

## 2023-08-12 DIAGNOSIS — R531 Weakness: Secondary | ICD-10-CM | POA: Diagnosis not present

## 2023-08-16 ENCOUNTER — Ambulatory Visit: Payer: Medicare PPO | Attending: Cardiology | Admitting: Cardiology

## 2023-08-16 ENCOUNTER — Encounter: Payer: Self-pay | Admitting: Cardiology

## 2023-08-16 VITALS — BP 134/74 | HR 59 | Ht 64.0 in | Wt 215.4 lb

## 2023-08-16 DIAGNOSIS — N1831 Chronic kidney disease, stage 3a: Secondary | ICD-10-CM | POA: Diagnosis not present

## 2023-08-16 DIAGNOSIS — E782 Mixed hyperlipidemia: Secondary | ICD-10-CM

## 2023-08-16 DIAGNOSIS — J4489 Other specified chronic obstructive pulmonary disease: Secondary | ICD-10-CM | POA: Diagnosis not present

## 2023-08-16 DIAGNOSIS — M810 Age-related osteoporosis without current pathological fracture: Secondary | ICD-10-CM | POA: Diagnosis not present

## 2023-08-16 DIAGNOSIS — I480 Paroxysmal atrial fibrillation: Secondary | ICD-10-CM

## 2023-08-16 DIAGNOSIS — D631 Anemia in chronic kidney disease: Secondary | ICD-10-CM | POA: Diagnosis not present

## 2023-08-16 DIAGNOSIS — I10 Essential (primary) hypertension: Secondary | ICD-10-CM

## 2023-08-16 DIAGNOSIS — E1122 Type 2 diabetes mellitus with diabetic chronic kidney disease: Secondary | ICD-10-CM | POA: Diagnosis not present

## 2023-08-16 DIAGNOSIS — I2510 Atherosclerotic heart disease of native coronary artery without angina pectoris: Secondary | ICD-10-CM | POA: Diagnosis not present

## 2023-08-16 DIAGNOSIS — D509 Iron deficiency anemia, unspecified: Secondary | ICD-10-CM | POA: Diagnosis not present

## 2023-08-16 MED ORDER — ROSUVASTATIN CALCIUM 20 MG PO TABS: 20.0000 mg | ORAL_TABLET | Freq: Every evening | ORAL | 3 refills | Status: DC

## 2023-08-16 MED ORDER — EZETIMIBE 10 MG PO TABS: 10.0000 mg | ORAL_TABLET | Freq: Every evening | ORAL | 3 refills | Status: DC

## 2023-08-16 NOTE — Patient Instructions (Signed)
 Medication Instructions:  Continue same medications *If you need a refill on your cardiac medications before your next appointment, please call your pharmacy*   Lab Work: None ordered   Testing/Procedures: None ordered   Follow-Up: At [facility], you and your health needs are our priority.  As part of our continuing mission to provide you with exceptional heart care, we have created designated Provider Care Teams.  These Care Teams include your primary Cardiologist (physician) and Advanced Practice Providers (APPs -  Physician Assistants and Nurse Practitioners) who all work together to provide you with the care you need, when you need it.  We recommend signing up for the patient portal called "MyChart".  Sign up information is provided on this After Visit Summary.  MyChart is used to connect with patients for Virtual Visits (Telemedicine).  Patients are able to view lab/test results, encounter notes, upcoming appointments, etc.  Non-urgent messages can be sent to your provider as well.   To learn more about what you can do with MyChart, go to [email or web address].    Your next appointment:  1 year  Call in July to schedule Nov appointment    Provider:  Dr.Jordan

## 2023-08-19 DIAGNOSIS — J4520 Mild intermittent asthma, uncomplicated: Secondary | ICD-10-CM | POA: Diagnosis not present

## 2023-08-20 ENCOUNTER — Other Ambulatory Visit: Payer: Self-pay | Admitting: Cardiology

## 2023-08-20 ENCOUNTER — Other Ambulatory Visit: Payer: Self-pay | Admitting: Adult Health

## 2023-08-23 DIAGNOSIS — N1831 Chronic kidney disease, stage 3a: Secondary | ICD-10-CM | POA: Diagnosis not present

## 2023-08-23 DIAGNOSIS — R8271 Bacteriuria: Secondary | ICD-10-CM | POA: Diagnosis not present

## 2023-08-23 DIAGNOSIS — N3020 Other chronic cystitis without hematuria: Secondary | ICD-10-CM | POA: Diagnosis not present

## 2023-08-23 DIAGNOSIS — I2510 Atherosclerotic heart disease of native coronary artery without angina pectoris: Secondary | ICD-10-CM | POA: Diagnosis not present

## 2023-08-23 DIAGNOSIS — I480 Paroxysmal atrial fibrillation: Secondary | ICD-10-CM | POA: Diagnosis not present

## 2023-08-23 DIAGNOSIS — E1122 Type 2 diabetes mellitus with diabetic chronic kidney disease: Secondary | ICD-10-CM | POA: Diagnosis not present

## 2023-08-23 DIAGNOSIS — D509 Iron deficiency anemia, unspecified: Secondary | ICD-10-CM | POA: Diagnosis not present

## 2023-08-23 DIAGNOSIS — I10 Essential (primary) hypertension: Secondary | ICD-10-CM | POA: Diagnosis not present

## 2023-08-23 DIAGNOSIS — M810 Age-related osteoporosis without current pathological fracture: Secondary | ICD-10-CM | POA: Diagnosis not present

## 2023-08-23 DIAGNOSIS — D631 Anemia in chronic kidney disease: Secondary | ICD-10-CM | POA: Diagnosis not present

## 2023-08-23 DIAGNOSIS — J4489 Other specified chronic obstructive pulmonary disease: Secondary | ICD-10-CM | POA: Diagnosis not present

## 2023-08-24 ENCOUNTER — Other Ambulatory Visit: Payer: Self-pay | Admitting: Adult Health

## 2023-08-24 DIAGNOSIS — Z79899 Other long term (current) drug therapy: Secondary | ICD-10-CM | POA: Diagnosis not present

## 2023-08-24 DIAGNOSIS — I4891 Unspecified atrial fibrillation: Secondary | ICD-10-CM | POA: Diagnosis not present

## 2023-08-24 DIAGNOSIS — E876 Hypokalemia: Secondary | ICD-10-CM | POA: Diagnosis not present

## 2023-08-24 DIAGNOSIS — E039 Hypothyroidism, unspecified: Secondary | ICD-10-CM | POA: Diagnosis not present

## 2023-08-24 DIAGNOSIS — I10 Essential (primary) hypertension: Secondary | ICD-10-CM | POA: Diagnosis not present

## 2023-08-24 DIAGNOSIS — N390 Urinary tract infection, site not specified: Secondary | ICD-10-CM | POA: Diagnosis not present

## 2023-08-24 DIAGNOSIS — M810 Age-related osteoporosis without current pathological fracture: Secondary | ICD-10-CM | POA: Diagnosis not present

## 2023-08-24 DIAGNOSIS — E559 Vitamin D deficiency, unspecified: Secondary | ICD-10-CM | POA: Diagnosis not present

## 2023-08-24 DIAGNOSIS — D649 Anemia, unspecified: Secondary | ICD-10-CM | POA: Diagnosis not present

## 2023-08-25 DIAGNOSIS — M810 Age-related osteoporosis without current pathological fracture: Secondary | ICD-10-CM | POA: Diagnosis not present

## 2023-08-25 DIAGNOSIS — I10 Essential (primary) hypertension: Secondary | ICD-10-CM | POA: Diagnosis not present

## 2023-08-25 DIAGNOSIS — D509 Iron deficiency anemia, unspecified: Secondary | ICD-10-CM | POA: Diagnosis not present

## 2023-08-25 DIAGNOSIS — J4489 Other specified chronic obstructive pulmonary disease: Secondary | ICD-10-CM | POA: Diagnosis not present

## 2023-08-25 DIAGNOSIS — N1831 Chronic kidney disease, stage 3a: Secondary | ICD-10-CM | POA: Diagnosis not present

## 2023-08-25 DIAGNOSIS — I480 Paroxysmal atrial fibrillation: Secondary | ICD-10-CM | POA: Diagnosis not present

## 2023-08-25 DIAGNOSIS — D631 Anemia in chronic kidney disease: Secondary | ICD-10-CM | POA: Diagnosis not present

## 2023-08-25 DIAGNOSIS — E1122 Type 2 diabetes mellitus with diabetic chronic kidney disease: Secondary | ICD-10-CM | POA: Diagnosis not present

## 2023-08-25 DIAGNOSIS — I2510 Atherosclerotic heart disease of native coronary artery without angina pectoris: Secondary | ICD-10-CM | POA: Diagnosis not present

## 2023-08-31 DIAGNOSIS — Z961 Presence of intraocular lens: Secondary | ICD-10-CM | POA: Diagnosis not present

## 2023-08-31 DIAGNOSIS — H26493 Other secondary cataract, bilateral: Secondary | ICD-10-CM | POA: Diagnosis not present

## 2023-09-01 DIAGNOSIS — I10 Essential (primary) hypertension: Secondary | ICD-10-CM | POA: Diagnosis not present

## 2023-09-01 DIAGNOSIS — E1122 Type 2 diabetes mellitus with diabetic chronic kidney disease: Secondary | ICD-10-CM | POA: Diagnosis not present

## 2023-09-01 DIAGNOSIS — D631 Anemia in chronic kidney disease: Secondary | ICD-10-CM | POA: Diagnosis not present

## 2023-09-01 DIAGNOSIS — I2510 Atherosclerotic heart disease of native coronary artery without angina pectoris: Secondary | ICD-10-CM | POA: Diagnosis not present

## 2023-09-01 DIAGNOSIS — D509 Iron deficiency anemia, unspecified: Secondary | ICD-10-CM | POA: Diagnosis not present

## 2023-09-01 DIAGNOSIS — J4489 Other specified chronic obstructive pulmonary disease: Secondary | ICD-10-CM | POA: Diagnosis not present

## 2023-09-01 DIAGNOSIS — I480 Paroxysmal atrial fibrillation: Secondary | ICD-10-CM | POA: Diagnosis not present

## 2023-09-01 DIAGNOSIS — N1831 Chronic kidney disease, stage 3a: Secondary | ICD-10-CM | POA: Diagnosis not present

## 2023-09-01 DIAGNOSIS — M810 Age-related osteoporosis without current pathological fracture: Secondary | ICD-10-CM | POA: Diagnosis not present

## 2023-09-06 DIAGNOSIS — M810 Age-related osteoporosis without current pathological fracture: Secondary | ICD-10-CM | POA: Diagnosis not present

## 2023-09-07 DIAGNOSIS — I2510 Atherosclerotic heart disease of native coronary artery without angina pectoris: Secondary | ICD-10-CM | POA: Diagnosis not present

## 2023-09-07 DIAGNOSIS — J4489 Other specified chronic obstructive pulmonary disease: Secondary | ICD-10-CM | POA: Diagnosis not present

## 2023-09-07 DIAGNOSIS — I10 Essential (primary) hypertension: Secondary | ICD-10-CM | POA: Diagnosis not present

## 2023-09-07 DIAGNOSIS — E1122 Type 2 diabetes mellitus with diabetic chronic kidney disease: Secondary | ICD-10-CM | POA: Diagnosis not present

## 2023-09-07 DIAGNOSIS — I480 Paroxysmal atrial fibrillation: Secondary | ICD-10-CM | POA: Diagnosis not present

## 2023-09-07 DIAGNOSIS — D509 Iron deficiency anemia, unspecified: Secondary | ICD-10-CM | POA: Diagnosis not present

## 2023-09-07 DIAGNOSIS — D631 Anemia in chronic kidney disease: Secondary | ICD-10-CM | POA: Diagnosis not present

## 2023-09-07 DIAGNOSIS — N1831 Chronic kidney disease, stage 3a: Secondary | ICD-10-CM | POA: Diagnosis not present

## 2023-09-07 DIAGNOSIS — M810 Age-related osteoporosis without current pathological fracture: Secondary | ICD-10-CM | POA: Diagnosis not present

## 2023-09-17 DIAGNOSIS — D509 Iron deficiency anemia, unspecified: Secondary | ICD-10-CM | POA: Diagnosis not present

## 2023-09-17 DIAGNOSIS — J4489 Other specified chronic obstructive pulmonary disease: Secondary | ICD-10-CM | POA: Diagnosis not present

## 2023-09-17 DIAGNOSIS — I2510 Atherosclerotic heart disease of native coronary artery without angina pectoris: Secondary | ICD-10-CM | POA: Diagnosis not present

## 2023-09-17 DIAGNOSIS — I480 Paroxysmal atrial fibrillation: Secondary | ICD-10-CM | POA: Diagnosis not present

## 2023-09-17 DIAGNOSIS — D631 Anemia in chronic kidney disease: Secondary | ICD-10-CM | POA: Diagnosis not present

## 2023-09-17 DIAGNOSIS — M810 Age-related osteoporosis without current pathological fracture: Secondary | ICD-10-CM | POA: Diagnosis not present

## 2023-09-17 DIAGNOSIS — N1831 Chronic kidney disease, stage 3a: Secondary | ICD-10-CM | POA: Diagnosis not present

## 2023-09-17 DIAGNOSIS — E1122 Type 2 diabetes mellitus with diabetic chronic kidney disease: Secondary | ICD-10-CM | POA: Diagnosis not present

## 2023-09-17 DIAGNOSIS — I10 Essential (primary) hypertension: Secondary | ICD-10-CM | POA: Diagnosis not present

## 2023-09-18 DIAGNOSIS — J4520 Mild intermittent asthma, uncomplicated: Secondary | ICD-10-CM | POA: Diagnosis not present

## 2023-09-21 DIAGNOSIS — D631 Anemia in chronic kidney disease: Secondary | ICD-10-CM | POA: Diagnosis not present

## 2023-09-21 DIAGNOSIS — I480 Paroxysmal atrial fibrillation: Secondary | ICD-10-CM | POA: Diagnosis not present

## 2023-09-21 DIAGNOSIS — I2510 Atherosclerotic heart disease of native coronary artery without angina pectoris: Secondary | ICD-10-CM | POA: Diagnosis not present

## 2023-09-21 DIAGNOSIS — I10 Essential (primary) hypertension: Secondary | ICD-10-CM | POA: Diagnosis not present

## 2023-09-21 DIAGNOSIS — E1122 Type 2 diabetes mellitus with diabetic chronic kidney disease: Secondary | ICD-10-CM | POA: Diagnosis not present

## 2023-09-21 DIAGNOSIS — N1831 Chronic kidney disease, stage 3a: Secondary | ICD-10-CM | POA: Diagnosis not present

## 2023-09-21 DIAGNOSIS — M810 Age-related osteoporosis without current pathological fracture: Secondary | ICD-10-CM | POA: Diagnosis not present

## 2023-09-21 DIAGNOSIS — D509 Iron deficiency anemia, unspecified: Secondary | ICD-10-CM | POA: Diagnosis not present

## 2023-09-21 DIAGNOSIS — J4489 Other specified chronic obstructive pulmonary disease: Secondary | ICD-10-CM | POA: Diagnosis not present

## 2023-10-01 ENCOUNTER — Other Ambulatory Visit: Payer: Self-pay | Admitting: Pulmonary Disease

## 2023-10-05 DIAGNOSIS — I480 Paroxysmal atrial fibrillation: Secondary | ICD-10-CM | POA: Diagnosis not present

## 2023-10-05 DIAGNOSIS — E1122 Type 2 diabetes mellitus with diabetic chronic kidney disease: Secondary | ICD-10-CM | POA: Diagnosis not present

## 2023-10-05 DIAGNOSIS — D631 Anemia in chronic kidney disease: Secondary | ICD-10-CM | POA: Diagnosis not present

## 2023-10-05 DIAGNOSIS — I10 Essential (primary) hypertension: Secondary | ICD-10-CM | POA: Diagnosis not present

## 2023-10-05 DIAGNOSIS — J4489 Other specified chronic obstructive pulmonary disease: Secondary | ICD-10-CM | POA: Diagnosis not present

## 2023-10-05 DIAGNOSIS — M810 Age-related osteoporosis without current pathological fracture: Secondary | ICD-10-CM | POA: Diagnosis not present

## 2023-10-05 DIAGNOSIS — N1831 Chronic kidney disease, stage 3a: Secondary | ICD-10-CM | POA: Diagnosis not present

## 2023-10-05 DIAGNOSIS — I2510 Atherosclerotic heart disease of native coronary artery without angina pectoris: Secondary | ICD-10-CM | POA: Diagnosis not present

## 2023-10-05 DIAGNOSIS — D509 Iron deficiency anemia, unspecified: Secondary | ICD-10-CM | POA: Diagnosis not present

## 2023-10-10 IMAGING — DX DG CHEST 1V PORT
1 series · 1 of 1 positions shown · non-contrast
Comparison: Portable chest 11/15/2019 and earlier. CT Abdomen and
Pelvis 05/09/2020.

CLINICAL DATA: 85-year-old female with chest pain.

EXAM:
PORTABLE CHEST 1 VIEW

[chest ap]
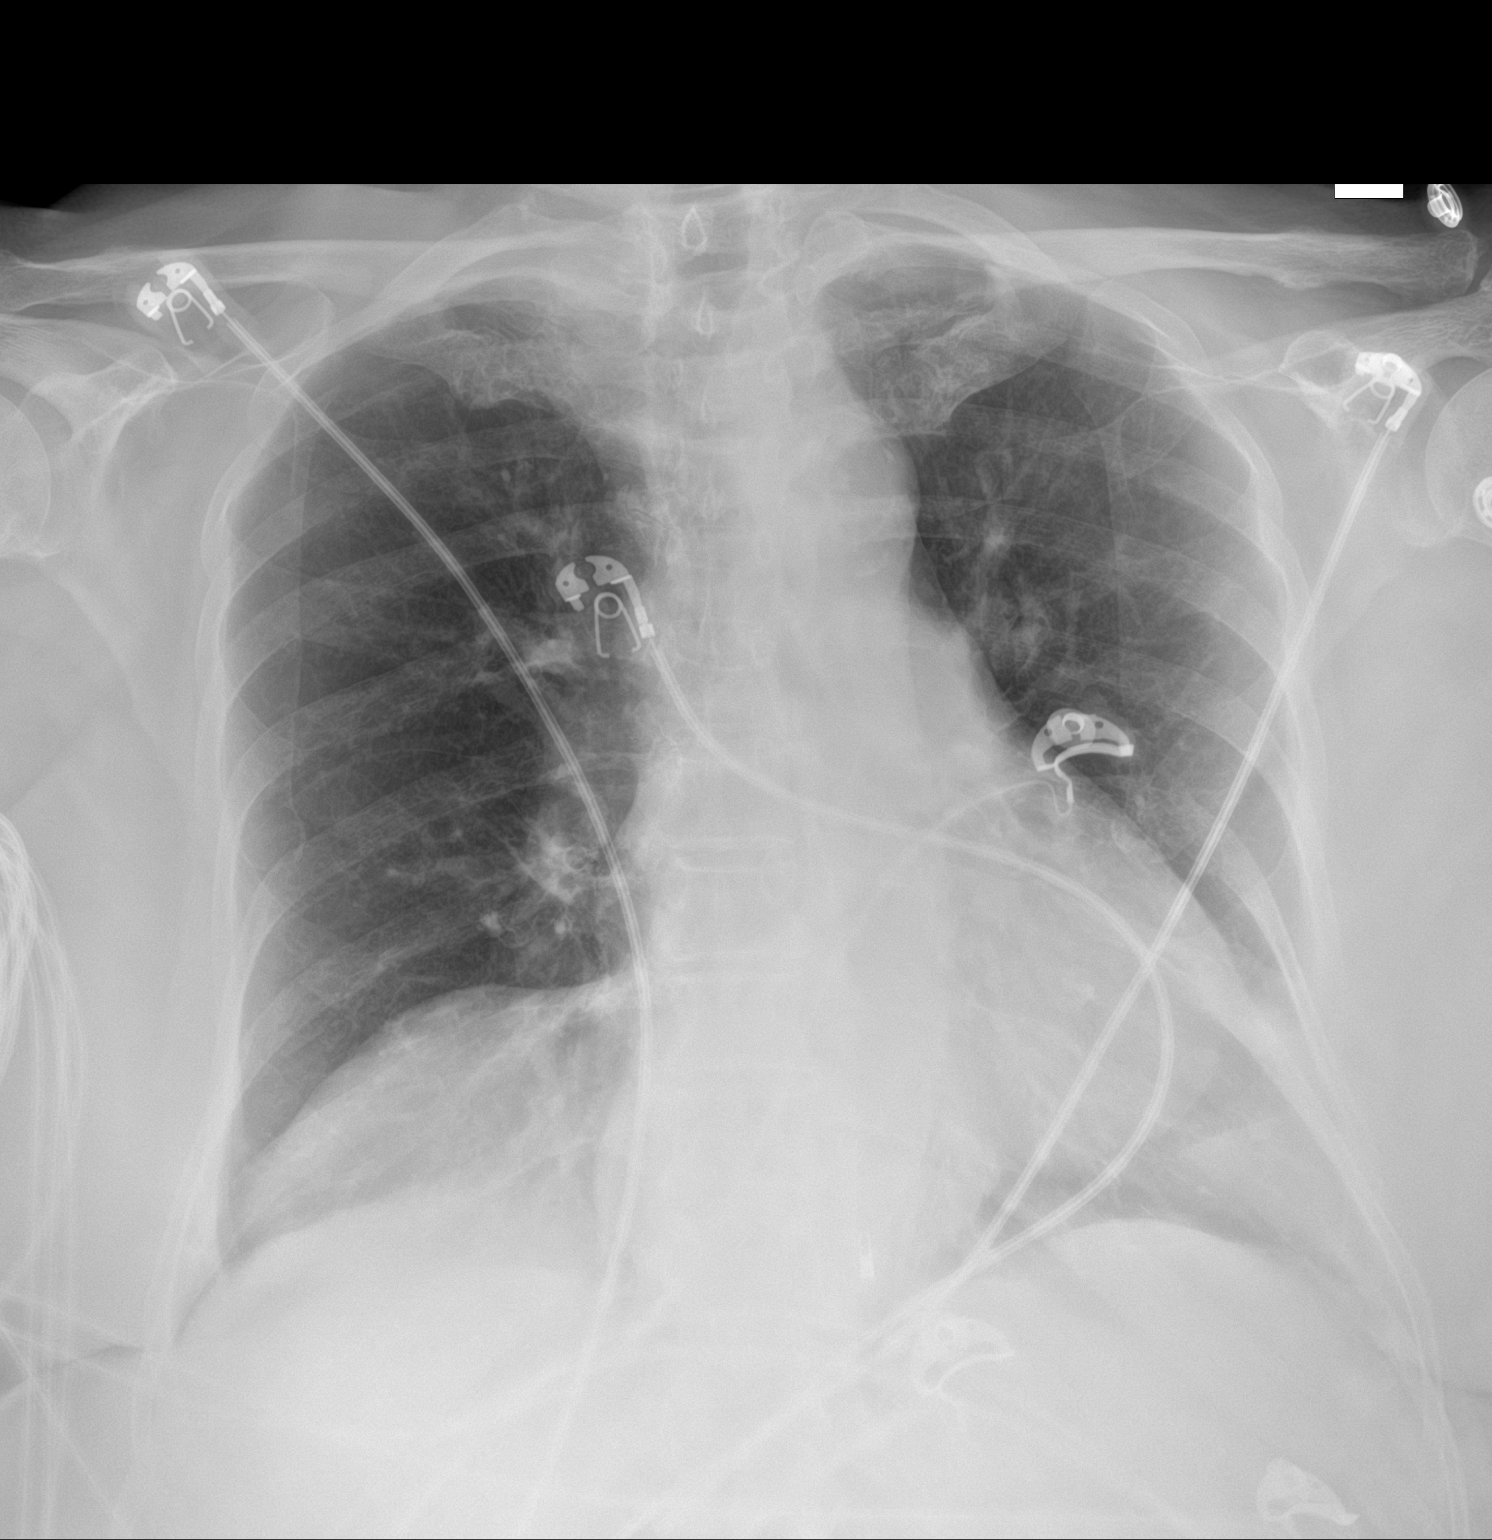

[1 of 1 positions shown; findings below may reference images not displayed]

FINDINGS: Portable AP semi upright view at 2620 hours. Stable lung volumes and
mediastinal contours, with eventration of the diaphragm and chronic
hiatal hernia. Visualized tracheal air column is within normal
limits.

Allowing for portable technique the lungs are clear. No pneumothorax
or pleural effusion. No acute osseous abnormality identified.
IMPRESSION: No acute cardiopulmonary abnormality.

Chronic hiatal hernia.

## 2023-10-11 DIAGNOSIS — I2510 Atherosclerotic heart disease of native coronary artery without angina pectoris: Secondary | ICD-10-CM | POA: Diagnosis not present

## 2023-10-11 DIAGNOSIS — D631 Anemia in chronic kidney disease: Secondary | ICD-10-CM | POA: Diagnosis not present

## 2023-10-11 DIAGNOSIS — D509 Iron deficiency anemia, unspecified: Secondary | ICD-10-CM | POA: Diagnosis not present

## 2023-10-11 DIAGNOSIS — I10 Essential (primary) hypertension: Secondary | ICD-10-CM | POA: Diagnosis not present

## 2023-10-11 DIAGNOSIS — I480 Paroxysmal atrial fibrillation: Secondary | ICD-10-CM | POA: Diagnosis not present

## 2023-10-11 DIAGNOSIS — M810 Age-related osteoporosis without current pathological fracture: Secondary | ICD-10-CM | POA: Diagnosis not present

## 2023-10-11 DIAGNOSIS — N1831 Chronic kidney disease, stage 3a: Secondary | ICD-10-CM | POA: Diagnosis not present

## 2023-10-11 DIAGNOSIS — E1122 Type 2 diabetes mellitus with diabetic chronic kidney disease: Secondary | ICD-10-CM | POA: Diagnosis not present

## 2023-10-11 DIAGNOSIS — J4489 Other specified chronic obstructive pulmonary disease: Secondary | ICD-10-CM | POA: Diagnosis not present

## 2023-10-18 ENCOUNTER — Other Ambulatory Visit: Payer: Self-pay | Admitting: Pulmonary Disease

## 2023-10-19 DIAGNOSIS — J4520 Mild intermittent asthma, uncomplicated: Secondary | ICD-10-CM | POA: Diagnosis not present

## 2023-11-12 ENCOUNTER — Telehealth: Payer: Self-pay | Admitting: Pulmonary Disease

## 2023-11-12 ENCOUNTER — Encounter: Payer: Self-pay | Admitting: *Deleted

## 2023-11-12 NOTE — Telephone Encounter (Signed)
 Tammy, please see mychart message sent by pt's daughter and advise.

## 2023-11-12 NOTE — Telephone Encounter (Signed)
 Not that the patient daughter is aware of.

## 2023-11-12 NOTE — Telephone Encounter (Signed)
 Called and spoke to patient daughter, Cynthia(DPR). She stated that patient has been experiencing voice hoarsenessand mouth/tongue soreness. She is concerned that this related to Brovana  and Pulmicort . Pt does not rinse her mouth out. Pt stopped these medications two nights ago. Sx have slightly improved.  Montie would like pt to restart meds because she noticed that patient was coughing last night.   Sarah, please advise. Thanks

## 2023-11-12 NOTE — Telephone Encounter (Signed)
 Adah Acron daughter states patient's mouth is sore. Stopped using Nebulizer machine. States is having symptom of cough. Adah Acron phone number is [phone].

## 2023-11-12 NOTE — Telephone Encounter (Signed)
 Ruthell Lauraine FALCON, NP to Claudene Lebron LABOR, CMA  [facility] Triage Pool     11/12/23  2:42 PM  Tell her to start the Brovana  only. Make sure she is rinsing her mouth after each dose. If she has no issues with Brovana  or a few days, they can add Pulmicort . She needs to be see by her MD. Thanks  I called and spoke with the pt's daughter and notified of response per Lauraine  She verbalized understanding  I routed her copy of the response to pt's mychart per her request

## 2023-11-18 DIAGNOSIS — E559 Vitamin D deficiency, unspecified: Secondary | ICD-10-CM | POA: Diagnosis not present

## 2023-11-18 DIAGNOSIS — I10 Essential (primary) hypertension: Secondary | ICD-10-CM | POA: Diagnosis not present

## 2023-11-18 DIAGNOSIS — E782 Mixed hyperlipidemia: Secondary | ICD-10-CM | POA: Diagnosis not present

## 2023-11-18 DIAGNOSIS — E1122 Type 2 diabetes mellitus with diabetic chronic kidney disease: Secondary | ICD-10-CM | POA: Diagnosis not present

## 2023-11-19 DIAGNOSIS — J4520 Mild intermittent asthma, uncomplicated: Secondary | ICD-10-CM | POA: Diagnosis not present

## 2023-11-21 DIAGNOSIS — W19XXXA Unspecified fall, initial encounter: Secondary | ICD-10-CM | POA: Diagnosis not present

## 2023-11-21 DIAGNOSIS — Z043 Encounter for examination and observation following other accident: Secondary | ICD-10-CM | POA: Diagnosis not present

## 2023-11-21 DIAGNOSIS — M25522 Pain in left elbow: Secondary | ICD-10-CM | POA: Diagnosis not present

## 2023-11-21 DIAGNOSIS — S42292A Other displaced fracture of upper end of left humerus, initial encounter for closed fracture: Secondary | ICD-10-CM | POA: Diagnosis not present

## 2023-11-21 DIAGNOSIS — S42252A Displaced fracture of greater tuberosity of left humerus, initial encounter for closed fracture: Secondary | ICD-10-CM | POA: Diagnosis not present

## 2023-11-21 DIAGNOSIS — S43015A Anterior dislocation of left humerus, initial encounter: Secondary | ICD-10-CM | POA: Diagnosis not present

## 2023-11-21 DIAGNOSIS — M79621 Pain in right upper arm: Secondary | ICD-10-CM | POA: Diagnosis not present

## 2023-11-25 DIAGNOSIS — S43015A Anterior dislocation of left humerus, initial encounter: Secondary | ICD-10-CM | POA: Diagnosis not present

## 2023-12-09 DIAGNOSIS — S43015A Anterior dislocation of left humerus, initial encounter: Secondary | ICD-10-CM | POA: Diagnosis not present

## 2023-12-13 DIAGNOSIS — N3020 Other chronic cystitis without hematuria: Secondary | ICD-10-CM | POA: Diagnosis not present

## 2023-12-13 DIAGNOSIS — R8271 Bacteriuria: Secondary | ICD-10-CM | POA: Diagnosis not present

## 2023-12-13 DIAGNOSIS — N393 Stress incontinence (female) (male): Secondary | ICD-10-CM | POA: Diagnosis not present

## 2023-12-17 DIAGNOSIS — J4520 Mild intermittent asthma, uncomplicated: Secondary | ICD-10-CM | POA: Diagnosis not present

## 2023-12-23 DIAGNOSIS — M25512 Pain in left shoulder: Secondary | ICD-10-CM | POA: Diagnosis not present

## 2023-12-28 ENCOUNTER — Ambulatory Visit: Payer: Medicare PPO | Admitting: Pulmonary Disease

## 2023-12-28 ENCOUNTER — Encounter: Payer: Self-pay | Admitting: Pulmonary Disease

## 2023-12-28 VITALS — BP 144/79 | HR 74 | Ht 64.0 in | Wt 210.0 lb

## 2023-12-28 DIAGNOSIS — J4540 Moderate persistent asthma, uncomplicated: Secondary | ICD-10-CM

## 2023-12-28 MED ORDER — BUDESONIDE 0.5 MG/2ML IN SUSP: 0.5000 mg | Freq: Two times a day (BID) | RESPIRATORY_TRACT | 11 refills | Status: AC

## 2023-12-28 MED ORDER — ARFORMOTEROL TARTRATE 15 MCG/2ML IN NEBU: 15.0000 ug | INHALATION_SOLUTION | Freq: Two times a day (BID) | RESPIRATORY_TRACT | 11 refills | Status: AC

## 2023-12-28 NOTE — Patient Instructions (Addendum)
 Continue budesonide and brovana nebs twice daily  Continue montelukast daily  Continue Xyzal for allergies  Use flonase 1 spray per nostril daily at bed time  Continue pantoprazole 40mg  daily, 30 minutes before breakfast  Follow up in 6 months

## 2023-12-28 NOTE — Progress Notes (Unsigned)
 Synopsis: Referred in June 2024 for Chronic Cough by Linus Galas, NP  Subjective:   PATIENT ID: Vicki Morris GENDER: female DOB: 1936/08/09, MRN: [id number]  HPI  Chief Complaint  Patient presents with  . Follow-up    Pt states she been doing well, restarted brovana last month no issues    Vicki Morris is an 88 year old woman, never smoker with history of GERD, hypertension, hypothyroidism, paroxysmal atrial fibrillation and obesity who is referred to pulmonary clinic for cough.   Initial OV 03/17/23 She reports having cough for 2 months The cough can make her short of breath. This cough started May 8. She has had prior episodes of prolonged cough symptoms. She has been on course of steroids and breathing treatment and antibiotics.   She had covid 9/23 with prolonged cough symptoms. She has had several boughts of bronchitis.   She has night time awakenings due to cough. Once in a while she will feel like food gets caught in her throat. No post nasal drip. She had sinus surgery many years ago. She took allergy shots for years. She is not taking any allergy medicines. No seasonal component to her cough. She is taking pantoprazole 40mg  daily for GERD, which was a recent change. She was started on breztri inhaler but she is having a hard time using this.   She has knee and bilateral hip pains. She has watery eyes.   She had significant second hand smoke exposure. Her husband died from lung cancer and COPD.   OV 06/30/23 She was seen 05/20/23 by Rubye Oaks, NP for on going cough issues. She was started on delsym cough syrup and tessalon perles and hydromet for severe cough.   The patient, with a history of chronic cough and shortness of breath, presents for follow-up. They report that their cough has improved but still occurs in spells. They also report increasing shortness of breath, especially with activity, and describe it as "getting so out of breath doing stuff." They also mention  that they have an upcoming appointment with their cardiologist.  The patient also reports a sensation of tightness in the middle of their chest, which they describe as not being painful but noticeable. This sensation does not appear to be related to activity or reflux symptoms. They also report numbness in their toes, which they describe as feeling like they are asleep, and a dry mouth. They have a history of borderline diabetes and were previously on Ozempic, but had to stop due to side effects.  The patient is currently on nebulizer treatments with budesonide and Brovana, which they believe have helped reduce their cough. They also report that they have been using a home oxygen monitor and have noticed that their oxygen levels drop to 93% and their heart rate increases to the 80s with activity, such as bathing.  OV 12/28/23   Past Medical History:  Diagnosis Date  . Allergic rhinitis, cause unspecified   . Anxiety   . Arthritis   . Chronic anticoagulation   . Depression   . Dysrhythmia    afib   . GERD (gastroesophageal reflux disease)   . History of echocardiogram    Echo 7/22: EF 55-60, no RWMA, mild asymmetric LVH, normal RVSF, mild LAE, trivial MR, mild AV sclerosis without stenosis  . History of TIAs   . Hyperlipidemia   . Hypertension   . Hypothyroidism   . Normal nuclear stress test 12/2011   EF 64%, clinically and electrically negative  for ischemia. Myoview scan with apical thinning, could not exclude small subendocardial scar. No ischemia. // Myoview 7/22: EF 58, no ischemia, low risk  . Obesity   . PAF (paroxysmal atrial fibrillation) (HCC)   . Peripheral vascular disease (HCC)   . Pneumonia    hx of   . Polyp of nasal cavity   . PONV (postoperative nausea and vomiting)   . Stroke [facility])    mini stroke  years ago   . Urinary tract infection    hx of      Family History  Problem Relation Age of Onset  . Heart disease Mother   . Heart disease Father   . Heart  attack Father   . Breast cancer Paternal Aunt      Social History   Socioeconomic History  . Marital status: Widowed    Spouse name: Not on file  . Number of children: Not on file  . Years of education: Not on file  . Highest education level: Not on file  Occupational History  . Occupation: Retired  Tobacco Use  . Smoking status: Never  . Smokeless tobacco: Never  Vaping Use  . Vaping status: Never Used  Substance and Sexual Activity  . Alcohol use: No  . Drug use: No  . Sexual activity: Never  Other Topics Concern  . Not on file  Social History Narrative  . Not on file   Social Drivers of Health   Financial Resource Strain: Not on file  Food Insecurity: Not on file  Transportation Needs: Not on file  Physical Activity: Not on file  Stress: Not on file  Social Connections: Not on file  Intimate Partner Violence: Not on file     Allergies  Allergen Reactions  . Other Anaphylaxis and Other (See Comments)    Red meat, Pork, Beef, Lamb  . Beef-Derived Drug Products   . Contrast Media [Iodinated Contrast Media] Hives  . Meat Extract     Other reaction(s): anaphylaxis  . Sulfonamide Derivatives Other (See Comments)    Unknown  . Clarithromycin Nausea And Vomiting  . Nabumetone Rash     Outpatient Medications Prior to Visit  Medication Sig Dispense Refill  . acetaminophen (TYLENOL) 500 MG tablet Take 1,000 mg by mouth every 6 (six) hours as needed for moderate pain or headache.    . albuterol (PROVENTIL) (2.5 MG/3ML) 0.083% nebulizer solution Take 3 mLs (2.5 mg total) by nebulization every 6 (six) hours as needed for wheezing or shortness of breath. 84 each 5  . albuterol (VENTOLIN HFA) 108 (90 Base) MCG/ACT inhaler Inhale 2 puffs into the lungs every 6 (six) hours as needed for wheezing or shortness of breath. 8 g 6  . arformoterol (BROVANA) 15 MCG/2ML NEBU Take 2 mLs (15 mcg total) by nebulization 2 (two) times daily. 120 mL 6  . budesonide (PULMICORT) 0.5 MG/2ML  nebulizer solution Take 2 MILLILITERS (0.5 mg total) by nebulization (2) times daily. 120 mL 5  . buPROPion (WELLBUTRIN XL) 150 MG 24 hr tablet Take 150 mg by mouth daily.    Marland Kitchen denosumab (PROLIA) 60 MG/ML SOSY injection Inject 60 mg into the skin every 6 (six) months.    . diltiazem (CARDIZEM CD) 240 MG 24 hr capsule Take 240 mg by mouth at bedtime.     Marland Kitchen EPINEPHrine 0.3 mg/0.3 mL IJ SOAJ injection Inject 0.3 mg into the muscle as needed for anaphylaxis.    Marland Kitchen ezetimibe (ZETIA) 10 MG tablet Take 1 tablet (10  mg total) by mouth every evening. 30 tablet 0  . ferrous sulfate 325 (65 FE) MG EC tablet Take 325 mg by mouth daily with breakfast.    . HYDROcodone bit-homatropine (HYDROMET) 5-1.5 MG/5ML syrup Take 5 mLs by mouth every 6 (six) hours as needed for cough. 120 mL 0  . levocetirizine (XYZAL) 5 MG tablet Take 5 mg by mouth 2 (two) times daily.    . montelukast (SINGULAIR) 10 MG tablet Take 10 mg by mouth daily.    Marland Kitchen olmesartan-hydrochlorothiazide (BENICAR HCT) 40-25 MG tablet Take 1 tablet by mouth daily.    . pantoprazole (PROTONIX) 40 MG tablet Take 40 mg by mouth every morning.    . polyethylene glycol (MIRALAX / GLYCOLAX) 17 g packet Take 17 g by mouth daily as needed for mild constipation.    . Potassium Chloride ER 20 MEQ TBCR Take 1 tablet by mouth every other day.    . predniSONE (DELTASONE) 10 MG tablet 4 tabs for 2 days, then 3 tabs for 2 days, 2 tabs for 2 days, then 1 tab for 2 days, then stop 20 tablet 0  . rivaroxaban (XARELTO) 20 MG TABS tablet Take 20 mg by mouth daily with supper.    . rosuvastatin (CRESTOR) 20 MG tablet Take 1 tablet (20 mg total) by mouth every evening. 30 tablet 0  . sertraline (ZOLOFT) 50 MG tablet Take 50 mg by mouth at bedtime.     Marland Kitchen SYNTHROID 100 MCG tablet Take 100 mcg by mouth daily before breakfast.   0  . VITAMIN D PO Take by mouth daily.     No facility-administered medications prior to visit.   Review of Systems  Constitutional:  Negative for  chills, fever, malaise/fatigue and weight loss.  HENT:  Negative for congestion, sinus pain and sore throat.   Eyes: Negative.   Respiratory:  Positive for cough and shortness of breath. Negative for hemoptysis, sputum production and wheezing.   Cardiovascular:  Negative for chest pain, palpitations, orthopnea, claudication and leg swelling.  Gastrointestinal:  Negative for abdominal pain, heartburn, nausea and vomiting.  Genitourinary: Negative.   Musculoskeletal:  Positive for joint pain. Negative for myalgias.  Skin:  Negative for rash.  Neurological:  Negative for weakness.  Endo/Heme/Allergies: Negative.   Psychiatric/Behavioral: Negative.     Objective:   Vitals:   12/28/23 1544  BP: (!) 144/79  Pulse: 74  SpO2: 94%  Weight: 210 lb (95.3 kg)  Height: 5\' 4"  (1.626 m)   Physical Exam Constitutional:      General: She is not in acute distress.    Appearance: She is obese. She is not ill-appearing.  HENT:     Head: Normocephalic and atraumatic.  Eyes:     General: No scleral icterus.    Conjunctiva/sclera: Conjunctivae normal.  Cardiovascular:     Rate and Rhythm: Normal rate and regular rhythm.     Pulses: Normal pulses.     Heart sounds: Normal heart sounds. No murmur heard. Pulmonary:     Effort: Pulmonary effort is normal.     Breath sounds: Normal breath sounds. No wheezing, rhonchi or rales.  Musculoskeletal:     Right lower leg: No edema.     Left lower leg: No edema.  Skin:    General: Skin is warm and dry.  Neurological:     General: No focal deficit present.     Mental Status: She is alert.   CBC    Component Value Date/Time   WBC  24.1 (H) 05/05/2023 2124   RBC 4.68 05/05/2023 2124   HGB 14.4 05/05/2023 2124   HCT 43.6 05/05/2023 2124   PLT 285 05/05/2023 2124   MCV 93.2 05/05/2023 2124   MCH 30.8 05/05/2023 2124   MCHC 33.0 05/05/2023 2124   RDW 12.9 05/05/2023 2124   LYMPHSABS 1.6 10/25/2021 0624   MONOABS 0.9 10/25/2021 0624   EOSABS 0.4  10/25/2021 0624   BASOSABS 0.1 10/25/2021 0624      Latest Ref Rng & Units 10/25/2021    6:24 AM 09/09/2018   10:44 AM 10/07/2017   12:14 PM  BMP  Glucose 70 - 99 mg/dL 782  956  213   BUN 8 - 23 mg/dL 21  21  13    Creatinine 0.44 - 1.00 mg/dL 0.86  5.78  4.69   BUN/Creat Ratio 12 - 28   18   Sodium 135 - 145 mmol/L 139  142  144   Potassium 3.5 - 5.1 mmol/L 3.2  3.7  4.4   Chloride 98 - 111 mmol/L 103  106  104   CO2 22 - 32 mmol/L 25  25  23    Calcium 8.9 - 10.3 mg/dL 8.9  9.2  9.4    Chest imaging: HRCT 03/30/23 1. Mild, bland appearing and predominantly bandlike scarring of the bilateral lung bases. No evidence of fibrotic interstitial lung disease. 2. Minimal emphysema. 3. Occasional tiny clustered centrilobular and tree-in-bud nodules, for example in the lateral segment right middle lobe, consistent with minimal atypical infection. 4. Mild tracheobronchomalacia on expiratory phase imaging. No significant air trapping. 5. Hiatal hernia. 6. Coronary artery disease.  CXR 10/25/21 Portable AP semi upright view at 0608 hours. Stable lung volumes and mediastinal contours, with eventration of the diaphragm and chronic hiatal hernia. Visualized tracheal air column is within normal limits.   Allowing for portable technique the lungs are clear. No pneumothorax or pleural effusion. No acute osseous abnormality identified  PFT:    Latest Ref Rng & Units 06/30/2023   11:38 AM  PFT Results  FVC-Pre L 1.67   FVC-Predicted Pre % 69   FVC-Post L 1.79   FVC-Predicted Post % 74   Pre FEV1/FVC % % 74   Post FEV1/FCV % % 75   FEV1-Pre L 1.24   FEV1-Predicted Pre % 70   FEV1-Post L 1.34   DLCO uncorrected ml/min/mmHg 21.98   DLCO UNC% % 118   DLCO corrected ml/min/mmHg 21.35   DLCO COR %Predicted % 115   DLVA Predicted % 104   TLC L 3.92   TLC % Predicted % 77   RV % Predicted % 82     Labs:  Path:  Echo: 2022: EF 55-60%, Grade I diastolic dysfunction, RV size and  systolic function  Heart Catheterization:  Assessment & Plan:   No diagnosis found.  Discussion: Vicki Morris is an 88 year old woman, never smoker with history of GERD, hypertension, hypothyroidism, paroxysmal atrial fibrillation and obesity who returns to pulmonary clinic for bronchitis.   She has mild restrictive defect and scattered areas of basilar scarring on HRCT chest. Will check inflammatory labs to rule out ILD. She has dry mouth symptoms, possible sjogren's disease.   For her bronchitis she is to continue budesonide and brovana.   She is to continue pantoprazole 40mg  daily, 30 minutes before breakfast. Recommend using a wedge pillow when sleeping. Recommend sitting upright for 2-3 hours after meals.  Follow up in 6 months.   Melody Comas, MD  [facility] Office: [phone]    Current Outpatient Medications:  .  acetaminophen (TYLENOL) 500 MG tablet, Take 1,000 mg by mouth every 6 (six) hours as needed for moderate pain or headache., Disp: , Rfl:  .  albuterol (PROVENTIL) (2.5 MG/3ML) 0.083% nebulizer solution, Take 3 mLs (2.5 mg total) by nebulization every 6 (six) hours as needed for wheezing or shortness of breath., Disp: 84 each, Rfl: 5 .  albuterol (VENTOLIN HFA) 108 (90 Base) MCG/ACT inhaler, Inhale 2 puffs into the lungs every 6 (six) hours as needed for wheezing or shortness of breath., Disp: 8 g, Rfl: 6 .  arformoterol (BROVANA) 15 MCG/2ML NEBU, Take 2 mLs (15 mcg total) by nebulization 2 (two) times daily., Disp: 120 mL, Rfl: 6 .  budesonide (PULMICORT) 0.5 MG/2ML nebulizer solution, Take 2 MILLILITERS (0.5 mg total) by nebulization (2) times daily., Disp: 120 mL, Rfl: 5 .  buPROPion (WELLBUTRIN XL) 150 MG 24 hr tablet, Take 150 mg by mouth daily., Disp: , Rfl:  .  denosumab (PROLIA) 60 MG/ML SOSY injection, Inject 60 mg into the skin every 6 (six) months., Disp: , Rfl:  .  diltiazem (CARDIZEM CD) 240 MG 24 hr capsule, Take 240 mg by  mouth at bedtime. , Disp: , Rfl:  .  EPINEPHrine 0.3 mg/0.3 mL IJ SOAJ injection, Inject 0.3 mg into the muscle as needed for anaphylaxis., Disp: , Rfl:  .  ezetimibe (ZETIA) 10 MG tablet, Take 1 tablet (10 mg total) by mouth every evening., Disp: 30 tablet, Rfl: 0 .  ferrous sulfate 325 (65 FE) MG EC tablet, Take 325 mg by mouth daily with breakfast., Disp: , Rfl:  .  HYDROcodone bit-homatropine (HYDROMET) 5-1.5 MG/5ML syrup, Take 5 mLs by mouth every 6 (six) hours as needed for cough., Disp: 120 mL, Rfl: 0 .  levocetirizine (XYZAL) 5 MG tablet, Take 5 mg by mouth 2 (two) times daily., Disp: , Rfl:  .  montelukast (SINGULAIR) 10 MG tablet, Take 10 mg by mouth daily., Disp: , Rfl:  .  olmesartan-hydrochlorothiazide (BENICAR HCT) 40-25 MG tablet, Take 1 tablet by mouth daily., Disp: , Rfl:  .  pantoprazole (PROTONIX) 40 MG tablet, Take 40 mg by mouth every morning., Disp: , Rfl:  .  polyethylene glycol (MIRALAX / GLYCOLAX) 17 g packet, Take 17 g by mouth daily as needed for mild constipation., Disp: , Rfl:  .  Potassium Chloride ER 20 MEQ TBCR, Take 1 tablet by mouth every other day., Disp: , Rfl:  .  predniSONE (DELTASONE) 10 MG tablet, 4 tabs for 2 days, then 3 tabs for 2 days, 2 tabs for 2 days, then 1 tab for 2 days, then stop, Disp: 20 tablet, Rfl: 0 .  rivaroxaban (XARELTO) 20 MG TABS tablet, Take 20 mg by mouth daily with supper., Disp: , Rfl:  .  rosuvastatin (CRESTOR) 20 MG tablet, Take 1 tablet (20 mg total) by mouth every evening., Disp: 30 tablet, Rfl: 0 .  sertraline (ZOLOFT) 50 MG tablet, Take 50 mg by mouth at bedtime. , Disp: , Rfl:  .  SYNTHROID 100 MCG tablet, Take 100 mcg by mouth daily before breakfast. , Disp: , Rfl: 0 .  VITAMIN D PO, Take by mouth daily., Disp: , Rfl:

## 2023-12-30 ENCOUNTER — Encounter: Payer: Self-pay | Admitting: Pulmonary Disease

## 2024-01-06 DIAGNOSIS — S43015A Anterior dislocation of left humerus, initial encounter: Secondary | ICD-10-CM | POA: Diagnosis not present

## 2024-01-12 DIAGNOSIS — M25512 Pain in left shoulder: Secondary | ICD-10-CM | POA: Diagnosis not present

## 2024-01-12 DIAGNOSIS — M6281 Muscle weakness (generalized): Secondary | ICD-10-CM | POA: Diagnosis not present

## 2024-01-14 ENCOUNTER — Other Ambulatory Visit: Payer: Self-pay | Admitting: Cardiology

## 2024-01-17 DIAGNOSIS — J4520 Mild intermittent asthma, uncomplicated: Secondary | ICD-10-CM | POA: Diagnosis not present

## 2024-01-18 DIAGNOSIS — M6281 Muscle weakness (generalized): Secondary | ICD-10-CM | POA: Diagnosis not present

## 2024-01-18 DIAGNOSIS — M25512 Pain in left shoulder: Secondary | ICD-10-CM | POA: Diagnosis not present

## 2024-01-20 DIAGNOSIS — M25512 Pain in left shoulder: Secondary | ICD-10-CM | POA: Diagnosis not present

## 2024-01-20 DIAGNOSIS — M6281 Muscle weakness (generalized): Secondary | ICD-10-CM | POA: Diagnosis not present

## 2024-01-25 DIAGNOSIS — M25512 Pain in left shoulder: Secondary | ICD-10-CM | POA: Diagnosis not present

## 2024-01-25 DIAGNOSIS — M6281 Muscle weakness (generalized): Secondary | ICD-10-CM | POA: Diagnosis not present

## 2024-01-27 DIAGNOSIS — M6281 Muscle weakness (generalized): Secondary | ICD-10-CM | POA: Diagnosis not present

## 2024-01-27 DIAGNOSIS — M25512 Pain in left shoulder: Secondary | ICD-10-CM | POA: Diagnosis not present

## 2024-01-31 DIAGNOSIS — M25512 Pain in left shoulder: Secondary | ICD-10-CM | POA: Diagnosis not present

## 2024-01-31 DIAGNOSIS — M6281 Muscle weakness (generalized): Secondary | ICD-10-CM | POA: Diagnosis not present

## 2024-02-03 DIAGNOSIS — M6281 Muscle weakness (generalized): Secondary | ICD-10-CM | POA: Diagnosis not present

## 2024-02-03 DIAGNOSIS — M25512 Pain in left shoulder: Secondary | ICD-10-CM | POA: Diagnosis not present

## 2024-02-08 DIAGNOSIS — M6281 Muscle weakness (generalized): Secondary | ICD-10-CM | POA: Diagnosis not present

## 2024-02-08 DIAGNOSIS — M25512 Pain in left shoulder: Secondary | ICD-10-CM | POA: Diagnosis not present

## 2024-02-10 DIAGNOSIS — M25512 Pain in left shoulder: Secondary | ICD-10-CM | POA: Diagnosis not present

## 2024-02-10 DIAGNOSIS — M1712 Unilateral primary osteoarthritis, left knee: Secondary | ICD-10-CM | POA: Diagnosis not present

## 2024-02-10 DIAGNOSIS — M67911 Unspecified disorder of synovium and tendon, right shoulder: Secondary | ICD-10-CM | POA: Diagnosis not present

## 2024-02-11 DIAGNOSIS — M6281 Muscle weakness (generalized): Secondary | ICD-10-CM | POA: Diagnosis not present

## 2024-02-11 DIAGNOSIS — M25512 Pain in left shoulder: Secondary | ICD-10-CM | POA: Diagnosis not present

## 2024-02-14 DIAGNOSIS — M25512 Pain in left shoulder: Secondary | ICD-10-CM | POA: Diagnosis not present

## 2024-02-14 DIAGNOSIS — M6281 Muscle weakness (generalized): Secondary | ICD-10-CM | POA: Diagnosis not present

## 2024-02-16 DIAGNOSIS — J4520 Mild intermittent asthma, uncomplicated: Secondary | ICD-10-CM | POA: Diagnosis not present

## 2024-02-18 DIAGNOSIS — M25512 Pain in left shoulder: Secondary | ICD-10-CM | POA: Diagnosis not present

## 2024-02-18 DIAGNOSIS — M6281 Muscle weakness (generalized): Secondary | ICD-10-CM | POA: Diagnosis not present

## 2024-02-22 DIAGNOSIS — M6281 Muscle weakness (generalized): Secondary | ICD-10-CM | POA: Diagnosis not present

## 2024-02-22 DIAGNOSIS — M25512 Pain in left shoulder: Secondary | ICD-10-CM | POA: Diagnosis not present

## 2024-02-24 DIAGNOSIS — M25512 Pain in left shoulder: Secondary | ICD-10-CM | POA: Diagnosis not present

## 2024-02-24 DIAGNOSIS — M6281 Muscle weakness (generalized): Secondary | ICD-10-CM | POA: Diagnosis not present

## 2024-03-02 DIAGNOSIS — M25512 Pain in left shoulder: Secondary | ICD-10-CM | POA: Diagnosis not present

## 2024-03-02 DIAGNOSIS — M6281 Muscle weakness (generalized): Secondary | ICD-10-CM | POA: Diagnosis not present

## 2024-03-06 DIAGNOSIS — M25512 Pain in left shoulder: Secondary | ICD-10-CM | POA: Diagnosis not present

## 2024-03-06 DIAGNOSIS — M6281 Muscle weakness (generalized): Secondary | ICD-10-CM | POA: Diagnosis not present

## 2024-03-07 DIAGNOSIS — M810 Age-related osteoporosis without current pathological fracture: Secondary | ICD-10-CM | POA: Diagnosis not present

## 2024-03-10 DIAGNOSIS — M6281 Muscle weakness (generalized): Secondary | ICD-10-CM | POA: Diagnosis not present

## 2024-03-10 DIAGNOSIS — M25512 Pain in left shoulder: Secondary | ICD-10-CM | POA: Diagnosis not present

## 2024-03-14 DIAGNOSIS — M25512 Pain in left shoulder: Secondary | ICD-10-CM | POA: Diagnosis not present

## 2024-03-14 DIAGNOSIS — M6281 Muscle weakness (generalized): Secondary | ICD-10-CM | POA: Diagnosis not present

## 2024-03-16 DIAGNOSIS — M25512 Pain in left shoulder: Secondary | ICD-10-CM | POA: Diagnosis not present

## 2024-03-16 DIAGNOSIS — M6281 Muscle weakness (generalized): Secondary | ICD-10-CM | POA: Diagnosis not present

## 2024-03-18 DIAGNOSIS — J4520 Mild intermittent asthma, uncomplicated: Secondary | ICD-10-CM | POA: Diagnosis not present

## 2024-03-20 DIAGNOSIS — M6281 Muscle weakness (generalized): Secondary | ICD-10-CM | POA: Diagnosis not present

## 2024-03-20 DIAGNOSIS — M25512 Pain in left shoulder: Secondary | ICD-10-CM | POA: Diagnosis not present

## 2024-03-21 DIAGNOSIS — M25512 Pain in left shoulder: Secondary | ICD-10-CM | POA: Diagnosis not present

## 2024-03-23 DIAGNOSIS — M25512 Pain in left shoulder: Secondary | ICD-10-CM | POA: Diagnosis not present

## 2024-03-23 DIAGNOSIS — M6281 Muscle weakness (generalized): Secondary | ICD-10-CM | POA: Diagnosis not present

## 2024-03-28 DIAGNOSIS — M6281 Muscle weakness (generalized): Secondary | ICD-10-CM | POA: Diagnosis not present

## 2024-03-28 DIAGNOSIS — M25512 Pain in left shoulder: Secondary | ICD-10-CM | POA: Diagnosis not present

## 2024-03-30 DIAGNOSIS — M25512 Pain in left shoulder: Secondary | ICD-10-CM | POA: Diagnosis not present

## 2024-03-30 DIAGNOSIS — M6281 Muscle weakness (generalized): Secondary | ICD-10-CM | POA: Diagnosis not present

## 2024-04-04 DIAGNOSIS — R7303 Prediabetes: Secondary | ICD-10-CM | POA: Diagnosis not present

## 2024-04-04 DIAGNOSIS — E782 Mixed hyperlipidemia: Secondary | ICD-10-CM | POA: Diagnosis not present

## 2024-04-04 DIAGNOSIS — I10 Essential (primary) hypertension: Secondary | ICD-10-CM | POA: Diagnosis not present

## 2024-04-04 DIAGNOSIS — E039 Hypothyroidism, unspecified: Secondary | ICD-10-CM | POA: Diagnosis not present

## 2024-04-24 DIAGNOSIS — Z0000 Encounter for general adult medical examination without abnormal findings: Secondary | ICD-10-CM | POA: Diagnosis not present

## 2024-04-24 DIAGNOSIS — M810 Age-related osteoporosis without current pathological fracture: Secondary | ICD-10-CM | POA: Diagnosis not present

## 2024-04-24 DIAGNOSIS — E039 Hypothyroidism, unspecified: Secondary | ICD-10-CM | POA: Diagnosis not present

## 2024-04-24 DIAGNOSIS — I480 Paroxysmal atrial fibrillation: Secondary | ICD-10-CM | POA: Diagnosis not present

## 2024-04-24 DIAGNOSIS — N3941 Urge incontinence: Secondary | ICD-10-CM | POA: Diagnosis not present

## 2024-04-24 DIAGNOSIS — I10 Essential (primary) hypertension: Secondary | ICD-10-CM | POA: Diagnosis not present

## 2024-04-24 DIAGNOSIS — E559 Vitamin D deficiency, unspecified: Secondary | ICD-10-CM | POA: Diagnosis not present

## 2024-04-24 DIAGNOSIS — E1122 Type 2 diabetes mellitus with diabetic chronic kidney disease: Secondary | ICD-10-CM | POA: Diagnosis not present

## 2024-04-24 DIAGNOSIS — K219 Gastro-esophageal reflux disease without esophagitis: Secondary | ICD-10-CM | POA: Diagnosis not present

## 2024-05-22 DIAGNOSIS — C44311 Basal cell carcinoma of skin of nose: Secondary | ICD-10-CM | POA: Diagnosis not present

## 2024-06-28 ENCOUNTER — Other Ambulatory Visit: Payer: Self-pay | Admitting: Cardiology

## 2024-07-03 DIAGNOSIS — Z08 Encounter for follow-up examination after completed treatment for malignant neoplasm: Secondary | ICD-10-CM | POA: Diagnosis not present

## 2024-07-03 DIAGNOSIS — Z85828 Personal history of other malignant neoplasm of skin: Secondary | ICD-10-CM | POA: Diagnosis not present

## 2024-07-12 ENCOUNTER — Encounter: Payer: Self-pay | Admitting: Pulmonary Disease

## 2024-07-12 ENCOUNTER — Ambulatory Visit: Admitting: Pulmonary Disease

## 2024-07-12 VITALS — BP 142/83 | HR 63 | Ht 64.0 in | Wt 211.2 lb

## 2024-07-12 DIAGNOSIS — K219 Gastro-esophageal reflux disease without esophagitis: Secondary | ICD-10-CM

## 2024-07-12 DIAGNOSIS — R053 Chronic cough: Secondary | ICD-10-CM | POA: Diagnosis not present

## 2024-07-12 DIAGNOSIS — J4540 Moderate persistent asthma, uncomplicated: Secondary | ICD-10-CM

## 2024-07-12 DIAGNOSIS — Z23 Encounter for immunization: Secondary | ICD-10-CM | POA: Diagnosis not present

## 2024-07-12 MED ORDER — FAMOTIDINE 20 MG PO TABS: 20.0000 mg | ORAL_TABLET | Freq: Every day | ORAL | 5 refills | Status: DC

## 2024-07-12 NOTE — Progress Notes (Signed)
 Synopsis: Referred in June 2024 for Chronic Cough by Vicki Cork, NP  Subjective:   PATIENT ID: Vicki Morris GENDER: female DOB: 1936/08/01, MRN: [id number]  HPI  Chief Complaint  Patient presents with   Medical Management of Chronic Issues   Vicki Morris is an 88 year old woman, never smoker with history of GERD, hypertension, hypothyroidism, paroxysmal atrial fibrillation and obesity who is referred to pulmonary clinic for cough.   Initial OV 03/17/23 She reports having cough for 2 months The cough can make her short of breath. This cough started May 8. She has had prior episodes of prolonged cough symptoms. She has been on course of steroids and breathing treatment and antibiotics.   She had covid 9/23 with prolonged cough symptoms. She has had several boughts of bronchitis.   She has night time awakenings due to cough. Once in a while she will feel like food gets caught in her throat. No post nasal drip. She had sinus surgery many years ago. She took allergy  shots for years. She is not taking any allergy  medicines. No seasonal component to her cough. She is taking pantoprazole  40mg  daily for GERD, which was a recent change. She was started on breztri inhaler but she is having a hard time using this.   She has knee and bilateral hip pains. She has watery eyes.   She had significant second hand smoke exposure. Her husband died from lung cancer and COPD.   OV 06/30/23 She was seen 05/20/23 by Vicki Stank, NP for on going cough issues. She was started on delsym cough syrup and tessalon  perles and hydromet for severe cough.   The patient, with a history of chronic cough and shortness of breath, presents for follow-up. They report that their cough has improved but still occurs in spells. They also report increasing shortness of breath, especially with activity, and describe it as getting so out of breath doing stuff. They also mention that they have an upcoming appointment with their  cardiologist.  The patient also reports a sensation of tightness in the middle of their chest, which they describe as not being painful but noticeable. This sensation does not appear to be related to activity or reflux symptoms. They also report numbness in their toes, which they describe as feeling like they are asleep, and a dry mouth. They have a history of borderline diabetes and were previously on Ozempic, but had to stop due to side effects.  The patient is currently on nebulizer treatments with budesonide  and Brovana , which they believe have helped reduce their cough. They also report that they have been using a home oxygen monitor and have noticed that their oxygen levels drop to 93% and their heart rate increases to the 80s with activity, such as bathing.  OV 12/28/23 She experiences persistent cough and breathing difficulties, particularly worse at night and in the morning. Hoarseness occurs when talking. She was previously on budesonide  and Brovana  but stopped one due to mouth irritation, suspected to be a yeast infection. After stopping, her cough worsened, so she resumed both medications. She sometimes forgets to use her nebulizer, especially on weekends, but has hired help to ensure regular use during the week. No heartburn or reflux symptoms are present.   She has a history of allergies and previously received allergy  shots. Concern exists about the upcoming allergy  season. She uses Xyzal and Singulair for allergy  management and has Flonase available but does not use it regularly. Throat drainage is experienced, particularly  in the mornings, which feels like accumulation in her throat.  She experienced a shoulder dislocation and fracture on December 19, 2023. The injury involved the ball of the shoulder, which was reset in the emergency room at [facility]. Follow-up x-rays have been conducted, with the most recent one taken last week, showing that the bone is healing. Concern exists that the ball  is not fully back in place, as it appears slightly low.   OV 07/12/24 She experiences a persistent cough that varies in intensity and sometimes disrupts her sleep, requiring water to alleviate it. There is no wheezing. She uses a nebulizer twice daily but occasionally misses doses, particularly on weekends. Mucus production has increased over the past month, especially in the mornings, with brown-colored mucus sometimes present. She feels residual mucus in her throat after spitting. She has a history of allergies and uses Flonase nasal spray for congestion. She takes pantoprazole  for reflux but sometimes forgets doses, particularly on weekends. Her daughter notes past mouth soreness relieved by stopping budesonide  nebulizer treatments, though she rinses her mouth after use.  Past Medical History:  Diagnosis Date   Allergic rhinitis, cause unspecified    Anxiety    Arthritis    Chronic anticoagulation    Depression    Dysrhythmia    afib    GERD (gastroesophageal reflux disease)    History of echocardiogram    Echo 7/22: EF 55-60, no RWMA, mild asymmetric LVH, normal RVSF, mild LAE, trivial MR, mild AV sclerosis without stenosis   History of TIAs    Hyperlipidemia    Hypertension    Hypothyroidism    Normal nuclear stress test 12/2011   EF 64%, clinically and electrically negative for ischemia. Myoview  scan with apical thinning, could not exclude small subendocardial scar. No ischemia. // Myoview  7/22: EF 58, no ischemia, low risk   Obesity    PAF (paroxysmal atrial fibrillation) (HCC)    Peripheral vascular disease    Pneumonia    hx of    Polyp of nasal cavity    PONV (postoperative nausea and vomiting)    Stroke (HCC)    mini stroke  years ago    Urinary tract infection    hx of      Family History  Problem Relation Age of Onset   Heart disease Mother    Heart disease Father    Heart attack Father    Breast cancer Paternal Aunt      Social History   Socioeconomic  History   Marital status: Widowed    Spouse name: Not on file   Number of children: Not on file   Years of education: Not on file   Highest education level: Not on file  Occupational History   Occupation: Retired  Tobacco Use   Smoking status: Never   Smokeless tobacco: Never  Vaping Use   Vaping status: Never Used  Substance and Sexual Activity   Alcohol use: No   Drug use: No   Sexual activity: Never  Other Topics Concern   Not on file  Social History Narrative   Not on file   Social Drivers of Health   Financial Resource Strain: Not on file  Food Insecurity: Not on file  Transportation Needs: Not on file  Physical Activity: Not on file  Stress: Not on file  Social Connections: Not on file  Intimate Partner Violence: Not on file     Allergies  Allergen Reactions   Other Anaphylaxis and Other (See Comments)  Red meat, Pork, Beef, Lamb   Beef-Derived Drug Products    Contrast Media [Iodinated Contrast Media] Hives   Meat Extract     Other reaction(s): anaphylaxis   Sulfonamide Derivatives Other (See Comments)    Unknown   Clarithromycin Nausea And Vomiting   Nabumetone Rash     Outpatient Medications Prior to Visit  Medication Sig Dispense Refill   SYNTHROID  88 MCG tablet Take 88 mcg by mouth every morning.     acetaminophen  (TYLENOL ) 500 MG tablet Take 1,000 mg by mouth every 6 (six) hours as needed for moderate pain or headache.     albuterol  (PROVENTIL ) (2.5 MG/3ML) 0.083% nebulizer solution Take 3 mLs (2.5 mg total) by nebulization every 6 (six) hours as needed for wheezing or shortness of breath. 84 each 5   albuterol  (VENTOLIN  HFA) 108 (90 Base) MCG/ACT inhaler Inhale 2 puffs into the lungs every 6 (six) hours as needed for wheezing or shortness of breath. 8 g 6   arformoterol  (BROVANA ) 15 MCG/2ML NEBU Take 2 mLs (15 mcg total) by nebulization 2 (two) times daily. 120 mL 11   budesonide  (PULMICORT ) 0.5 MG/2ML nebulizer solution Take 2 mLs (0.5 mg total)  by nebulization 2 (two) times daily. 120 mL 11   buPROPion (WELLBUTRIN XL) 150 MG 24 hr tablet Take 150 mg by mouth daily.     denosumab  (PROLIA ) 60 MG/ML SOSY injection Inject 60 mg into the skin every 6 (six) months.     diltiazem  (CARDIZEM  CD) 240 MG 24 hr capsule Take 240 mg by mouth at bedtime.      EPINEPHrine  0.3 mg/0.3 mL IJ SOAJ injection Inject 0.3 mg into the muscle as needed for anaphylaxis.     ezetimibe  (ZETIA ) 10 MG tablet TAKE ONE TABLET BY MOUTH EVERY EVENING 90 tablet 0   ferrous sulfate 325 (65 FE) MG EC tablet Take 325 mg by mouth daily with breakfast. (Patient not taking: Reported on 07/12/2024)     HYDROcodone  bit-homatropine (HYDROMET) 5-1.5 MG/5ML syrup Take 5 mLs by mouth every 6 (six) hours as needed for cough. 120 mL 0   levocetirizine (XYZAL) 5 MG tablet Take 5 mg by mouth 2 (two) times daily.     montelukast (SINGULAIR) 10 MG tablet Take 10 mg by mouth daily.     olmesartan -hydrochlorothiazide  (BENICAR  HCT) 40-25 MG tablet Take 1 tablet by mouth daily.     pantoprazole  (PROTONIX ) 40 MG tablet Take 40 mg by mouth every morning.     polyethylene glycol (MIRALAX / GLYCOLAX) 17 g packet Take 17 g by mouth daily as needed for mild constipation.     Potassium Chloride  ER 20 MEQ TBCR Take 1 tablet by mouth every other day.     rivaroxaban (XARELTO) 20 MG TABS tablet Take 20 mg by mouth daily with supper.     rosuvastatin  (CRESTOR ) 20 MG tablet TAKE ONE TABLET BY MOUTH EVERY EVENING 30 tablet 0   sertraline  (ZOLOFT ) 50 MG tablet Take 50 mg by mouth at bedtime.      SYNTHROID  100 MCG tablet Take 100 mcg by mouth daily before breakfast.   0   VITAMIN D PO Take by mouth daily.     No facility-administered medications prior to visit.   Review of Systems  Constitutional:  Negative for chills, fever, malaise/fatigue and weight loss.  HENT:  Negative for congestion, sinus pain and sore throat.   Eyes: Negative.   Respiratory:  Positive for cough, shortness of breath and  wheezing. Negative for  hemoptysis and sputum production.   Cardiovascular:  Negative for chest pain, palpitations, orthopnea, claudication and leg swelling.  Gastrointestinal:  Negative for abdominal pain, heartburn, nausea and vomiting.  Genitourinary: Negative.   Musculoskeletal:  Negative for joint pain and myalgias.  Skin:  Negative for rash.  Neurological:  Negative for weakness.  Endo/Heme/Allergies: Negative.   Psychiatric/Behavioral: Negative.     Objective:   Vitals:   07/12/24 1357  BP: (!) 142/83  Pulse: 63  SpO2: 95%  Weight: 211 lb 3.2 oz (95.8 kg)  Height: 5' 4 (1.626 m)    Physical Exam Constitutional:      General: She is not in acute distress.    Appearance: She is obese. She is not ill-appearing.  HENT:     Head: Normocephalic and atraumatic.  Eyes:     General: No scleral icterus.    Conjunctiva/sclera: Conjunctivae normal.  Cardiovascular:     Rate and Rhythm: Normal rate and regular rhythm.     Pulses: Normal pulses.     Heart sounds: Normal heart sounds. No murmur heard. Pulmonary:     Effort: Pulmonary effort is normal.     Breath sounds: Normal breath sounds. No wheezing, rhonchi or rales.  Musculoskeletal:     Right lower leg: No edema.     Left lower leg: No edema.  Neurological:     Mental Status: She is alert.    CBC    Component Value Date/Time   WBC 24.1 (H) 05/05/2023 2124   RBC 4.68 05/05/2023 2124   HGB 14.4 05/05/2023 2124   HCT 43.6 05/05/2023 2124   PLT 285 05/05/2023 2124   MCV 93.2 05/05/2023 2124   MCH 30.8 05/05/2023 2124   MCHC 33.0 05/05/2023 2124   RDW 12.9 05/05/2023 2124   LYMPHSABS 1.6 10/25/2021 0624   MONOABS 0.9 10/25/2021 0624   EOSABS 0.4 10/25/2021 0624   BASOSABS 0.1 10/25/2021 0624      Latest Ref Rng & Units 10/25/2021    6:24 AM 09/09/2018   10:44 AM 10/07/2017   12:14 PM  BMP  Glucose 70 - 99 mg/dL 876  887  898   BUN 8 - 23 mg/dL 21  21  13    Creatinine 0.44 - 1.00 mg/dL 8.92  9.11  9.28    BUN/Creat Ratio 12 - 28   18   Sodium 135 - 145 mmol/L 139  142  144   Potassium 3.5 - 5.1 mmol/L 3.2  3.7  4.4   Chloride 98 - 111 mmol/L 103  106  104   CO2 22 - 32 mmol/L 25  25  23    Calcium  8.9 - 10.3 mg/dL 8.9  9.2  9.4    Chest imaging: HRCT 03/30/23 1. Mild, bland appearing and predominantly bandlike scarring of the bilateral lung bases. No evidence of fibrotic interstitial lung disease. 2. Minimal emphysema. 3. Occasional tiny clustered centrilobular and tree-in-bud nodules, for example in the lateral segment right middle lobe, consistent with minimal atypical infection. 4. Mild tracheobronchomalacia on expiratory phase imaging. No significant air trapping. 5. Hiatal hernia. 6. Coronary artery disease.  CXR 10/25/21 Portable AP semi upright view at 0608 hours. Stable lung volumes and mediastinal contours, with eventration of the diaphragm and chronic hiatal hernia. Visualized tracheal air column is within normal limits.   Allowing for portable technique the lungs are clear. No pneumothorax or pleural effusion. No acute osseous abnormality identified  PFT:    Latest Ref Rng & Units 06/30/2023  11:38 AM  PFT Results  FVC-Pre L 1.67   FVC-Predicted Pre % 69   FVC-Post L 1.79   FVC-Predicted Post % 74   Pre FEV1/FVC % % 74   Post FEV1/FCV % % 75   FEV1-Pre L 1.24   FEV1-Predicted Pre % 70   FEV1-Post L 1.34   DLCO uncorrected ml/min/mmHg 21.98   DLCO UNC% % 118   DLCO corrected ml/min/mmHg 21.35   DLCO COR %Predicted % 115   DLVA Predicted % 104   TLC L 3.92   TLC % Predicted % 77   RV % Predicted % 82     Labs:  Path:  Echo: 2022: EF 55-60%, Grade I diastolic dysfunction, RV size and systolic function  Heart Catheterization:  Assessment & Plan:   Moderate persistent asthmatic bronchitis without complication  Chronic cough  Gastroesophageal reflux disease without esophagitis - Plan: famotidine (PEPCID) 20 MG tablet  Influenza vaccination  administered at current visit - Plan: Flu vaccine HIGH DOSE PF(Fluzone Trivalent)  Discussion: Vicki Purdom is an 88 year old woman, never smoker with history of GERD, hypertension, hypothyroidism, paroxysmal atrial fibrillation and obesity who returns to pulmonary clinic for bronchitis.   Chronic Bronchitis Chronic cough with possible tracheobronchomalacia - Continue budesonide  and Brovana  as prescribed. - Ensure proper oral hygiene to prevent oral thrush. - Continue Xyzal and Singulair for allergy  management. - Flonase at night to reduce throat congestion.  Gastroesophageal reflux disease (GERD) GERD controlled with pantoprazole . No current symptoms. Advised on post-meal positioning and sleeping arrangements to prevent reflux. - Continue pantoprazole  30 minutes before breakfast. - Add famotidine 20mg  daily at bedtime - Consider using Reflux gourmet as directed on packaging - Maintain upright position after meals. - Continue sleeping in a recliner or using a wedge pillow.  Influenza vaccination - give high dose flu vaccine today  Follow up in 6 months.  Dorn Chill, MD [facility] Office: [phone]    Current Outpatient Medications:    famotidine (PEPCID) 20 MG tablet, Take 1 tablet (20 mg total) by mouth at bedtime., Disp: 30 tablet, Rfl: 5   SYNTHROID  88 MCG tablet, Take 88 mcg by mouth every morning., Disp: , Rfl:    acetaminophen  (TYLENOL ) 500 MG tablet, Take 1,000 mg by mouth every 6 (six) hours as needed for moderate pain or headache., Disp: , Rfl:    albuterol  (PROVENTIL ) (2.5 MG/3ML) 0.083% nebulizer solution, Take 3 mLs (2.5 mg total) by nebulization every 6 (six) hours as needed for wheezing or shortness of breath., Disp: 84 each, Rfl: 5   albuterol  (VENTOLIN  HFA) 108 (90 Base) MCG/ACT inhaler, Inhale 2 puffs into the lungs every 6 (six) hours as needed for wheezing or shortness of breath., Disp: 8 g, Rfl: 6   arformoterol  (BROVANA ) 15  MCG/2ML NEBU, Take 2 mLs (15 mcg total) by nebulization 2 (two) times daily., Disp: 120 mL, Rfl: 11   budesonide  (PULMICORT ) 0.5 MG/2ML nebulizer solution, Take 2 mLs (0.5 mg total) by nebulization 2 (two) times daily., Disp: 120 mL, Rfl: 11   buPROPion (WELLBUTRIN XL) 150 MG 24 hr tablet, Take 150 mg by mouth daily., Disp: , Rfl:    denosumab  (PROLIA ) 60 MG/ML SOSY injection, Inject 60 mg into the skin every 6 (six) months., Disp: , Rfl:    diltiazem  (CARDIZEM  CD) 240 MG 24 hr capsule, Take 240 mg by mouth at bedtime. , Disp: , Rfl:    EPINEPHrine  0.3 mg/0.3 mL IJ SOAJ injection, Inject 0.3 mg  into the muscle as needed for anaphylaxis., Disp: , Rfl:    ezetimibe  (ZETIA ) 10 MG tablet, TAKE ONE TABLET BY MOUTH EVERY EVENING, Disp: 90 tablet, Rfl: 0   ferrous sulfate 325 (65 FE) MG EC tablet, Take 325 mg by mouth daily with breakfast. (Patient not taking: Reported on 07/12/2024), Disp: , Rfl:    HYDROcodone  bit-homatropine (HYDROMET) 5-1.5 MG/5ML syrup, Take 5 mLs by mouth every 6 (six) hours as needed for cough., Disp: 120 mL, Rfl: 0   levocetirizine (XYZAL) 5 MG tablet, Take 5 mg by mouth 2 (two) times daily., Disp: , Rfl:    montelukast (SINGULAIR) 10 MG tablet, Take 10 mg by mouth daily., Disp: , Rfl:    olmesartan -hydrochlorothiazide  (BENICAR  HCT) 40-25 MG tablet, Take 1 tablet by mouth daily., Disp: , Rfl:    pantoprazole  (PROTONIX ) 40 MG tablet, Take 40 mg by mouth every morning., Disp: , Rfl:    polyethylene glycol (MIRALAX / GLYCOLAX) 17 g packet, Take 17 g by mouth daily as needed for mild constipation., Disp: , Rfl:    Potassium Chloride  ER 20 MEQ TBCR, Take 1 tablet by mouth every other day., Disp: , Rfl:    rivaroxaban (XARELTO) 20 MG TABS tablet, Take 20 mg by mouth daily with supper., Disp: , Rfl:    rosuvastatin  (CRESTOR ) 20 MG tablet, TAKE ONE TABLET BY MOUTH EVERY EVENING, Disp: 30 tablet, Rfl: 0   sertraline  (ZOLOFT ) 50 MG tablet, Take 50 mg by mouth at bedtime. , Disp: , Rfl:     SYNTHROID  100 MCG tablet, Take 100 mcg by mouth daily before breakfast. , Disp: , Rfl: 0   VITAMIN D PO, Take by mouth daily., Disp: , Rfl:

## 2024-07-12 NOTE — Patient Instructions (Addendum)
 Continue budesonide  and brovana  nebs twice daily  Continue montelukast daily  Continue Xyzal for allergies  Use flonase 1 spray per nostril daily at bed time as needed for nasal congestion  Start famotidine 20mg  daily at bedtime for reflux  Continue pantoprazole  40mg  daily, 30 minutes before breakfast  Consider using Reflux Gourmet to reduce reflux as well, can buy online  Follow up in 6 months

## 2024-07-20 ENCOUNTER — Telehealth: Payer: Self-pay

## 2024-07-20 DIAGNOSIS — K219 Gastro-esophageal reflux disease without esophagitis: Secondary | ICD-10-CM

## 2024-07-20 MED ORDER — FAMOTIDINE 20 MG PO TABS: 20.0000 mg | ORAL_TABLET | Freq: Every day | ORAL | 5 refills | Status: DC

## 2024-07-20 NOTE — Telephone Encounter (Signed)
 Copied from CRM [id number]. Topic: Clinical - Prescription Issue >> Jul 20, 2024 10:34 AM Corean SAUNDERS wrote: Reason for CRM: Patients daughter Montie states Dr. Kara never sent the famotidine (PEPCID) 20 MG tablet to [facility] - [place], [place] - [address] [place] [address] Phone: [phone] Fax: [phone]  Montie states the provider was going to do this during patients appointment on 10/8  Atc x1. lmtcb

## 2024-07-20 NOTE — Telephone Encounter (Signed)
 Copied from CRM #[id number]. Topic: Clinical - Medication Question >> Jul 20, 2024  1:57 PM Joesph PARAS wrote: Reason for CRM: Patient's daughter is returning call to [place]. Please return call to [facility].  Called and spoke with patient daughter,sent script to pharmacy.NFN

## 2024-07-25 DIAGNOSIS — D649 Anemia, unspecified: Secondary | ICD-10-CM | POA: Diagnosis not present

## 2024-07-25 DIAGNOSIS — E039 Hypothyroidism, unspecified: Secondary | ICD-10-CM | POA: Diagnosis not present

## 2024-09-06 ENCOUNTER — Other Ambulatory Visit: Payer: Self-pay | Admitting: Cardiology

## 2024-09-07 DIAGNOSIS — E039 Hypothyroidism, unspecified: Secondary | ICD-10-CM | POA: Diagnosis not present

## 2024-09-08 ENCOUNTER — Telehealth: Payer: Self-pay

## 2024-09-08 NOTE — Telephone Encounter (Signed)
 Copied from CRM [id number]. Topic: Clinical - Medical Advice >> Sep 08, 2024 11:29 AM Rilla NOVAK wrote: Reason for CRM: Patient's daughter calling with concerns. Thick brown mucous, thinks blood in roof of mouth. (Daughter has picture) States she's talked to Dr Kara about this issue.  Would like a call to discuss it. Please call daughter Montie @ [phone].   ----------------------------------------------------------------------- From previous Reason for Contact - Red Word Triage: Red Word that prompted transfer to Nurse Triage: Thick brown mucous, thinks blood in roof of mouth,  Spoke with daughter will reach out to provider to advise daughter understands they talked about LOV and its a ongoing problem is there anything to do ( she did states pt not taking Rx as she should)

## 2024-09-11 ENCOUNTER — Telehealth: Payer: Self-pay

## 2024-09-11 ENCOUNTER — Encounter: Payer: Self-pay | Admitting: Pulmonary Disease

## 2024-09-11 NOTE — Telephone Encounter (Signed)
 Requested picture from patient's daughter to understand better what may be happening in her mouth.  Offered antibtiotics to hep with reducing mucous production. No fevers or chills. Again, reviewed hiatal hernia contributing to reflux likely leading to mucous and cough.  Dorn Chill, MD [facility] Office: [phone]

## 2024-09-12 ENCOUNTER — Telehealth: Payer: Self-pay

## 2024-09-12 NOTE — Telephone Encounter (Signed)
 Thank you :)

## 2024-09-12 NOTE — Telephone Encounter (Signed)
 Tried to reach patient   provider recommend patient be evaluated by her PCP or us  to examine her mouth/throat to get a better idea of where the irritation is occurring that is leading to the blood she is seeing.

## 2024-09-12 NOTE — Telephone Encounter (Signed)
 I would recommend patient be evaluated by her PCP or us  to examine her mouth/throat to get a better idea of where the irritation is occurring that is leading to the blood she is seeing.  Thanks, JD

## 2024-09-13 ENCOUNTER — Telehealth: Payer: Self-pay

## 2024-09-13 NOTE — Telephone Encounter (Signed)
 Copied from CRM [id number]. Topic: Clinical - Medical Advice >> Sep 08, 2024 11:29 AM Rilla NOVAK wrote: Reason for CRM: Patient's daughter calling with concerns. Thick brown mucous, thinks blood in roof of mouth. (Daughter has picture) States she's talked to Dr Kara about this issue.  Would like a call to discuss it. Please call daughter Montie @ [phone].   ----------------------------------------------------------------------- From previous Reason for Contact - Red Word Triage: Red Word that prompted transfer to Nurse Triage: Thick brown mucous, thinks blood in roof of mouth, >> Sep 13, 2024  8:13 AM Leila C wrote: Patient's child Montie [phone] is returning the office/Zeyad Delaguila's call from yesterday. Unable to reach CAL. Please call back.    Spoke with patient daughter LOLLY will reach out to patients PCP

## 2024-09-23 ENCOUNTER — Other Ambulatory Visit: Payer: Self-pay | Admitting: Cardiology

## 2024-10-22 ENCOUNTER — Other Ambulatory Visit: Payer: Self-pay | Admitting: Pulmonary Disease

## 2024-10-22 DIAGNOSIS — K219 Gastro-esophageal reflux disease without esophagitis: Secondary | ICD-10-CM

## 2024-11-08 ENCOUNTER — Other Ambulatory Visit: Payer: Self-pay | Admitting: Cardiology
